# Patient Record
Sex: Female | Born: 1945 | Race: White | Hispanic: No | Smoking: Former smoker
Health system: Southern US, Community
[De-identification: ages and names within clinical notes are randomized; demographics above are authoritative.]

## PROBLEM LIST (undated history)

## (undated) DIAGNOSIS — H9190 Unspecified hearing loss, unspecified ear: Secondary | ICD-10-CM

## (undated) DIAGNOSIS — C3490 Malignant neoplasm of unspecified part of unspecified bronchus or lung: Secondary | ICD-10-CM

## (undated) DIAGNOSIS — R0602 Shortness of breath: Secondary | ICD-10-CM

## (undated) DIAGNOSIS — I10 Essential (primary) hypertension: Secondary | ICD-10-CM

## (undated) DIAGNOSIS — I739 Peripheral vascular disease, unspecified: Secondary | ICD-10-CM

## (undated) DIAGNOSIS — M25552 Pain in left hip: Secondary | ICD-10-CM

## (undated) DIAGNOSIS — M25512 Pain in left shoulder: Secondary | ICD-10-CM

## (undated) DIAGNOSIS — G2581 Restless legs syndrome: Secondary | ICD-10-CM

## (undated) DIAGNOSIS — F32A Depression, unspecified: Secondary | ICD-10-CM

## (undated) DIAGNOSIS — I6529 Occlusion and stenosis of unspecified carotid artery: Secondary | ICD-10-CM

## (undated) DIAGNOSIS — F329 Major depressive disorder, single episode, unspecified: Secondary | ICD-10-CM

## (undated) DIAGNOSIS — M545 Low back pain, unspecified: Secondary | ICD-10-CM

## (undated) DIAGNOSIS — C50919 Malignant neoplasm of unspecified site of unspecified female breast: Secondary | ICD-10-CM

## (undated) DIAGNOSIS — C801 Malignant (primary) neoplasm, unspecified: Secondary | ICD-10-CM

## (undated) DIAGNOSIS — E039 Hypothyroidism, unspecified: Secondary | ICD-10-CM

## (undated) DIAGNOSIS — R911 Solitary pulmonary nodule: Secondary | ICD-10-CM

## (undated) DIAGNOSIS — Z923 Personal history of irradiation: Secondary | ICD-10-CM

## (undated) HISTORY — PX: BREAST SURGERY: SHX581

## (undated) HISTORY — PX: TUBAL LIGATION: SHX77

## (undated) HISTORY — PX: VASCULAR SURGERY: SHX849

## (undated) HISTORY — PX: TONSILLECTOMY: SUR1361

## (undated) HISTORY — PX: MASTECTOMY: SHX3

## (undated) HISTORY — DX: Solitary pulmonary nodule: R91.1

---

## 1971-07-02 DIAGNOSIS — C50919 Malignant neoplasm of unspecified site of unspecified female breast: Secondary | ICD-10-CM

## 1971-07-02 HISTORY — DX: Malignant neoplasm of unspecified site of unspecified female breast: C50.919

## 1994-07-01 HISTORY — PX: PORTACATH PLACEMENT: SHX2246

## 1999-06-08 ENCOUNTER — Encounter: Admission: RE | Admit: 1999-06-08 | Discharge: 1999-06-08 | Payer: Self-pay | Admitting: Obstetrics and Gynecology

## 1999-06-08 ENCOUNTER — Encounter: Payer: Self-pay | Admitting: Obstetrics and Gynecology

## 1999-06-26 ENCOUNTER — Encounter (INDEPENDENT_AMBULATORY_CARE_PROVIDER_SITE_OTHER): Payer: Self-pay

## 1999-06-26 ENCOUNTER — Ambulatory Visit (HOSPITAL_COMMUNITY): Admission: RE | Admit: 1999-06-26 | Discharge: 1999-06-26 | Payer: Self-pay | Admitting: Obstetrics and Gynecology

## 2000-02-25 ENCOUNTER — Encounter: Admission: RE | Admit: 2000-02-25 | Discharge: 2000-02-25 | Payer: Self-pay | Admitting: Hematology & Oncology

## 2000-02-25 ENCOUNTER — Encounter: Payer: Self-pay | Admitting: Hematology & Oncology

## 2000-02-27 ENCOUNTER — Encounter: Admission: RE | Admit: 2000-02-27 | Discharge: 2000-02-27 | Payer: Self-pay | Admitting: Hematology & Oncology

## 2000-02-27 ENCOUNTER — Encounter: Payer: Self-pay | Admitting: Hematology & Oncology

## 2000-11-28 ENCOUNTER — Encounter: Admission: RE | Admit: 2000-11-28 | Discharge: 2000-11-28 | Payer: Self-pay | Admitting: Hematology & Oncology

## 2000-11-28 ENCOUNTER — Encounter: Payer: Self-pay | Admitting: Hematology & Oncology

## 2001-05-12 ENCOUNTER — Encounter: Payer: Self-pay | Admitting: Obstetrics and Gynecology

## 2001-05-12 ENCOUNTER — Encounter: Admission: RE | Admit: 2001-05-12 | Discharge: 2001-05-12 | Payer: Self-pay | Admitting: Obstetrics and Gynecology

## 2002-03-24 ENCOUNTER — Other Ambulatory Visit: Admission: RE | Admit: 2002-03-24 | Discharge: 2002-03-24 | Payer: Self-pay | Admitting: Gynecology

## 2003-06-02 ENCOUNTER — Other Ambulatory Visit: Admission: RE | Admit: 2003-06-02 | Discharge: 2003-06-02 | Payer: Self-pay | Admitting: Gynecology

## 2003-06-02 ENCOUNTER — Emergency Department (HOSPITAL_COMMUNITY): Admission: EM | Admit: 2003-06-02 | Discharge: 2003-06-02 | Payer: Self-pay

## 2003-06-02 ENCOUNTER — Encounter: Admission: RE | Admit: 2003-06-02 | Discharge: 2003-06-02 | Payer: Self-pay | Admitting: Hematology & Oncology

## 2004-01-03 ENCOUNTER — Ambulatory Visit (HOSPITAL_COMMUNITY): Admission: RE | Admit: 2004-01-03 | Discharge: 2004-01-04 | Payer: Self-pay | Admitting: Vascular Surgery

## 2004-01-07 HISTORY — PX: OTHER SURGICAL HISTORY: SHX169

## 2004-06-20 ENCOUNTER — Ambulatory Visit: Payer: Self-pay | Admitting: Hematology & Oncology

## 2004-12-27 ENCOUNTER — Ambulatory Visit: Payer: Self-pay | Admitting: Hematology & Oncology

## 2005-06-21 ENCOUNTER — Ambulatory Visit: Payer: Self-pay | Admitting: Hematology & Oncology

## 2006-04-30 ENCOUNTER — Ambulatory Visit: Payer: Self-pay | Admitting: Hematology & Oncology

## 2006-05-02 LAB — CBC WITH DIFFERENTIAL/PLATELET
BASO%: 0.3 % (ref 0.0–2.0)
Basophils Absolute: 0 10*3/uL (ref 0.0–0.1)
EOS%: 0.9 % (ref 0.0–7.0)
Eosinophils Absolute: 0.1 10*3/uL (ref 0.0–0.5)
HCT: 41.1 % (ref 34.8–46.6)
HGB: 14 g/dL (ref 11.6–15.9)
LYMPH%: 32 % (ref 14.0–48.0)
MCH: 33.4 pg (ref 26.0–34.0)
MCHC: 34.1 g/dL (ref 32.0–36.0)
MCV: 98.2 fL (ref 81.0–101.0)
MONO#: 1.1 10*3/uL — ABNORMAL HIGH (ref 0.1–0.9)
MONO%: 9.9 % (ref 0.0–13.0)
NEUT#: 6.4 10*3/uL (ref 1.5–6.5)
NEUT%: 56.9 % (ref 39.6–76.8)
Platelets: 291 10*3/uL (ref 145–400)
RBC: 4.19 10*6/uL (ref 3.70–5.32)
RDW: 14.2 % (ref 11.3–14.5)
WBC: 11.2 10*3/uL — ABNORMAL HIGH (ref 3.9–10.0)
lymph#: 3.6 10*3/uL — ABNORMAL HIGH (ref 0.9–3.3)

## 2006-05-02 LAB — COMPREHENSIVE METABOLIC PANEL
ALT: 12 U/L (ref 0–35)
AST: 16 U/L (ref 0–37)
Albumin: 4.1 g/dL (ref 3.5–5.2)
Alkaline Phosphatase: 54 U/L (ref 39–117)
BUN: 13 mg/dL (ref 6–23)
CO2: 26 mEq/L (ref 19–32)
Calcium: 9.2 mg/dL (ref 8.4–10.5)
Chloride: 103 mEq/L (ref 96–112)
Creatinine, Ser: 1.01 mg/dL (ref 0.40–1.20)
Glucose, Bld: 100 mg/dL — ABNORMAL HIGH (ref 70–99)
Potassium: 3.9 mEq/L (ref 3.5–5.3)
Sodium: 137 mEq/L (ref 135–145)
Total Bilirubin: 0.3 mg/dL (ref 0.3–1.2)
Total Protein: 7.2 g/dL (ref 6.0–8.3)

## 2006-05-05 LAB — HM DEXA SCAN

## 2006-05-16 ENCOUNTER — Other Ambulatory Visit: Admission: RE | Admit: 2006-05-16 | Discharge: 2006-05-16 | Payer: Self-pay | Admitting: Family Medicine

## 2006-05-29 LAB — HM PAP SMEAR

## 2007-05-06 ENCOUNTER — Ambulatory Visit: Payer: Self-pay | Admitting: Hematology & Oncology

## 2007-06-22 ENCOUNTER — Ambulatory Visit: Payer: Self-pay | Admitting: Hematology & Oncology

## 2007-06-26 LAB — COMPREHENSIVE METABOLIC PANEL
ALT: 13 U/L (ref 0–35)
AST: 17 U/L (ref 0–37)
Albumin: 4 g/dL (ref 3.5–5.2)
Alkaline Phosphatase: 58 U/L (ref 39–117)
BUN: 16 mg/dL (ref 6–23)
CO2: 26 mEq/L (ref 19–32)
Calcium: 9.3 mg/dL (ref 8.4–10.5)
Chloride: 106 mEq/L (ref 96–112)
Creatinine, Ser: 0.98 mg/dL (ref 0.40–1.20)
Glucose, Bld: 93 mg/dL (ref 70–99)
Potassium: 3.9 mEq/L (ref 3.5–5.3)
Sodium: 142 mEq/L (ref 135–145)
Total Bilirubin: 0.4 mg/dL (ref 0.3–1.2)
Total Protein: 7.1 g/dL (ref 6.0–8.3)

## 2007-06-26 LAB — CBC WITH DIFFERENTIAL/PLATELET
BASO%: 0.6 % (ref 0.0–2.0)
Basophils Absolute: 0.1 10*3/uL (ref 0.0–0.1)
EOS%: 1.7 % (ref 0.0–7.0)
Eosinophils Absolute: 0.2 10*3/uL (ref 0.0–0.5)
HCT: 41.6 % (ref 34.8–46.6)
HGB: 14.3 g/dL (ref 11.6–15.9)
LYMPH%: 30.6 % (ref 14.0–48.0)
MCH: 33 pg (ref 26.0–34.0)
MCHC: 34.3 g/dL (ref 32.0–36.0)
MCV: 96.2 fL (ref 81.0–101.0)
MONO#: 1.3 10*3/uL — ABNORMAL HIGH (ref 0.1–0.9)
MONO%: 11.9 % (ref 0.0–13.0)
NEUT#: 6 10*3/uL (ref 1.5–6.5)
NEUT%: 55.2 % (ref 39.6–76.8)
Platelets: 260 10*3/uL (ref 145–400)
RBC: 4.32 10*6/uL (ref 3.70–5.32)
RDW: 13.5 % (ref 11.3–14.5)
WBC: 10.8 10*3/uL — ABNORMAL HIGH (ref 3.9–10.0)
lymph#: 3.3 10*3/uL (ref 0.9–3.3)

## 2007-09-22 ENCOUNTER — Ambulatory Visit: Payer: Self-pay | Admitting: Hematology & Oncology

## 2008-03-16 ENCOUNTER — Ambulatory Visit: Payer: Self-pay | Admitting: Hematology & Oncology

## 2008-03-17 LAB — CBC WITH DIFFERENTIAL (CANCER CENTER ONLY)
BASO#: 0.1 10*3/uL (ref 0.0–0.2)
BASO%: 1 % (ref 0.0–2.0)
EOS%: 1.9 % (ref 0.0–7.0)
Eosinophils Absolute: 0.2 10*3/uL (ref 0.0–0.5)
HCT: 41.3 % (ref 34.8–46.6)
HGB: 14.2 g/dL (ref 11.6–15.9)
LYMPH#: 2.7 10*3/uL (ref 0.9–3.3)
LYMPH%: 28.5 % (ref 14.0–48.0)
MCH: 32.2 pg (ref 26.0–34.0)
MCHC: 34.3 g/dL (ref 32.0–36.0)
MCV: 94 fL (ref 81–101)
MONO#: 1.1 10*3/uL — ABNORMAL HIGH (ref 0.1–0.9)
MONO%: 12.2 % (ref 0.0–13.0)
NEUT#: 5.2 10*3/uL (ref 1.5–6.5)
NEUT%: 56.4 % (ref 39.6–80.0)
Platelets: 259 10*3/uL (ref 145–400)
RBC: 4.4 10*6/uL (ref 3.70–5.32)
RDW: 10.9 % (ref 10.5–14.6)
WBC: 9.3 10*3/uL (ref 3.9–10.0)

## 2008-03-17 LAB — COMPREHENSIVE METABOLIC PANEL
ALT: 11 U/L (ref 0–35)
AST: 15 U/L (ref 0–37)
Albumin: 3.9 g/dL (ref 3.5–5.2)
Alkaline Phosphatase: 70 U/L (ref 39–117)
BUN: 14 mg/dL (ref 6–23)
CO2: 26 mEq/L (ref 19–32)
Calcium: 9.8 mg/dL (ref 8.4–10.5)
Chloride: 107 mEq/L (ref 96–112)
Creatinine, Ser: 0.86 mg/dL (ref 0.40–1.20)
Glucose, Bld: 104 mg/dL — ABNORMAL HIGH (ref 70–99)
Potassium: 3.8 mEq/L (ref 3.5–5.3)
Sodium: 143 mEq/L (ref 135–145)
Total Bilirubin: 0.5 mg/dL (ref 0.3–1.2)
Total Protein: 7.1 g/dL (ref 6.0–8.3)

## 2008-09-13 ENCOUNTER — Ambulatory Visit: Payer: Self-pay | Admitting: Hematology & Oncology

## 2008-09-14 LAB — CBC WITH DIFFERENTIAL (CANCER CENTER ONLY)
BASO#: 0.1 10*3/uL (ref 0.0–0.2)
BASO%: 0.7 % (ref 0.0–2.0)
EOS%: 2 % (ref 0.0–7.0)
Eosinophils Absolute: 0.2 10*3/uL (ref 0.0–0.5)
HCT: 41.2 % (ref 34.8–46.6)
HGB: 13.7 g/dL (ref 11.6–15.9)
LYMPH#: 3 10*3/uL (ref 0.9–3.3)
LYMPH%: 29.2 % (ref 14.0–48.0)
MCH: 32.3 pg (ref 26.0–34.0)
MCHC: 33.2 g/dL (ref 32.0–36.0)
MCV: 97 fL (ref 81–101)
MONO#: 0.7 10*3/uL (ref 0.1–0.9)
MONO%: 7.1 % (ref 0.0–13.0)
NEUT#: 6.2 10*3/uL (ref 1.5–6.5)
NEUT%: 61 % (ref 39.6–80.0)
Platelets: 246 10*3/uL (ref 145–400)
RBC: 4.23 10*6/uL (ref 3.70–5.32)
RDW: 10.9 % (ref 10.5–14.6)
WBC: 10.1 10*3/uL — ABNORMAL HIGH (ref 3.9–10.0)

## 2008-09-14 LAB — COMPREHENSIVE METABOLIC PANEL
ALT: 11 U/L (ref 0–35)
AST: 14 U/L (ref 0–37)
Albumin: 3.9 g/dL (ref 3.5–5.2)
Alkaline Phosphatase: 70 U/L (ref 39–117)
BUN: 12 mg/dL (ref 6–23)
CO2: 26 mEq/L (ref 19–32)
Calcium: 9.9 mg/dL (ref 8.4–10.5)
Chloride: 104 mEq/L (ref 96–112)
Creatinine, Ser: 1.01 mg/dL (ref 0.40–1.20)
Glucose, Bld: 113 mg/dL — ABNORMAL HIGH (ref 70–99)
Potassium: 4 mEq/L (ref 3.5–5.3)
Sodium: 139 mEq/L (ref 135–145)
Total Bilirubin: 0.4 mg/dL (ref 0.3–1.2)
Total Protein: 7.3 g/dL (ref 6.0–8.3)

## 2009-04-04 ENCOUNTER — Ambulatory Visit: Payer: Self-pay | Admitting: Hematology & Oncology

## 2009-04-06 LAB — CBC WITH DIFFERENTIAL (CANCER CENTER ONLY)
BASO#: 0.1 10*3/uL (ref 0.0–0.2)
BASO%: 1.3 % (ref 0.0–2.0)
EOS%: 1.8 % (ref 0.0–7.0)
Eosinophils Absolute: 0.2 10*3/uL (ref 0.0–0.5)
HCT: 43.5 % (ref 34.8–46.6)
HGB: 15 g/dL (ref 11.6–15.9)
LYMPH#: 2.8 10*3/uL (ref 0.9–3.3)
LYMPH%: 26 % (ref 14.0–48.0)
MCH: 32.8 pg (ref 26.0–34.0)
MCHC: 34.4 g/dL (ref 32.0–36.0)
MCV: 95 fL (ref 81–101)
MONO#: 1 10*3/uL — ABNORMAL HIGH (ref 0.1–0.9)
MONO%: 9.6 % (ref 0.0–13.0)
NEUT#: 6.5 10*3/uL (ref 1.5–6.5)
NEUT%: 61.3 % (ref 39.6–80.0)
Platelets: 276 10*3/uL (ref 145–400)
RBC: 4.56 10*6/uL (ref 3.70–5.32)
RDW: 11.4 % (ref 10.5–14.6)
WBC: 10.6 10*3/uL — ABNORMAL HIGH (ref 3.9–10.0)

## 2009-04-07 LAB — COMPREHENSIVE METABOLIC PANEL
ALT: 11 U/L (ref 0–35)
AST: 14 U/L (ref 0–37)
Albumin: 4.1 g/dL (ref 3.5–5.2)
Alkaline Phosphatase: 68 U/L (ref 39–117)
BUN: 13 mg/dL (ref 6–23)
CO2: 28 mEq/L (ref 19–32)
Calcium: 9.6 mg/dL (ref 8.4–10.5)
Chloride: 103 mEq/L (ref 96–112)
Creatinine, Ser: 1.01 mg/dL (ref 0.40–1.20)
Glucose, Bld: 93 mg/dL (ref 70–99)
Potassium: 4 mEq/L (ref 3.5–5.3)
Sodium: 140 mEq/L (ref 135–145)
Total Bilirubin: 0.5 mg/dL (ref 0.3–1.2)
Total Protein: 7.3 g/dL (ref 6.0–8.3)

## 2009-04-07 LAB — VITAMIN D 25 HYDROXY (VIT D DEFICIENCY, FRACTURES): Vit D, 25-Hydroxy: 31 ng/mL (ref 30–89)

## 2009-04-14 LAB — FECAL OCCULT BLOOD, GUIAC - CHCC SATELLITE: Occult Blood: NEGATIVE

## 2009-10-03 ENCOUNTER — Ambulatory Visit: Payer: Self-pay | Admitting: Hematology & Oncology

## 2009-10-05 LAB — CBC WITH DIFFERENTIAL (CANCER CENTER ONLY)
BASO#: 0.1 10*3/uL (ref 0.0–0.2)
BASO%: 1.1 % (ref 0.0–2.0)
EOS%: 2 % (ref 0.0–7.0)
Eosinophils Absolute: 0.2 10*3/uL (ref 0.0–0.5)
HCT: 43.7 % (ref 34.8–46.6)
HGB: 14.7 g/dL (ref 11.6–15.9)
LYMPH#: 2.9 10*3/uL (ref 0.9–3.3)
LYMPH%: 28.5 % (ref 14.0–48.0)
MCH: 31.7 pg (ref 26.0–34.0)
MCHC: 33.6 g/dL (ref 32.0–36.0)
MCV: 94 fL (ref 81–101)
MONO#: 1 10*3/uL — ABNORMAL HIGH (ref 0.1–0.9)
MONO%: 9.6 % (ref 0.0–13.0)
NEUT#: 5.9 10*3/uL (ref 1.5–6.5)
NEUT%: 58.8 % (ref 39.6–80.0)
Platelets: 261 10*3/uL (ref 145–400)
RBC: 4.64 10*6/uL (ref 3.70–5.32)
RDW: 11.1 % (ref 10.5–14.6)
WBC: 10.1 10*3/uL — ABNORMAL HIGH (ref 3.9–10.0)

## 2009-10-05 LAB — COMPREHENSIVE METABOLIC PANEL
ALT: 10 U/L (ref 0–35)
AST: 15 U/L (ref 0–37)
Albumin: 4.1 g/dL (ref 3.5–5.2)
Alkaline Phosphatase: 68 U/L (ref 39–117)
BUN: 10 mg/dL (ref 6–23)
CO2: 26 mEq/L (ref 19–32)
Calcium: 9.4 mg/dL (ref 8.4–10.5)
Chloride: 104 mEq/L (ref 96–112)
Creatinine, Ser: 1.01 mg/dL (ref 0.40–1.20)
Glucose, Bld: 99 mg/dL (ref 70–99)
Potassium: 3.8 mEq/L (ref 3.5–5.3)
Sodium: 138 mEq/L (ref 135–145)
Total Bilirubin: 0.3 mg/dL (ref 0.3–1.2)
Total Protein: 6.6 g/dL (ref 6.0–8.3)

## 2009-10-05 LAB — VITAMIN D 25 HYDROXY (VIT D DEFICIENCY, FRACTURES): Vit D, 25-Hydroxy: 29 ng/mL — ABNORMAL LOW (ref 30–89)

## 2010-09-15 DIAGNOSIS — C50919 Malignant neoplasm of unspecified site of unspecified female breast: Secondary | ICD-10-CM | POA: Insufficient documentation

## 2010-09-15 DIAGNOSIS — I639 Cerebral infarction, unspecified: Secondary | ICD-10-CM | POA: Insufficient documentation

## 2010-10-04 ENCOUNTER — Other Ambulatory Visit: Payer: Self-pay | Admitting: Hematology & Oncology

## 2010-10-04 ENCOUNTER — Encounter (HOSPITAL_BASED_OUTPATIENT_CLINIC_OR_DEPARTMENT_OTHER): Payer: Medicare Other | Admitting: Hematology & Oncology

## 2010-10-04 DIAGNOSIS — Z853 Personal history of malignant neoplasm of breast: Secondary | ICD-10-CM

## 2010-10-04 DIAGNOSIS — E559 Vitamin D deficiency, unspecified: Secondary | ICD-10-CM

## 2010-10-04 DIAGNOSIS — C50419 Malignant neoplasm of upper-outer quadrant of unspecified female breast: Secondary | ICD-10-CM

## 2010-10-04 DIAGNOSIS — F17200 Nicotine dependence, unspecified, uncomplicated: Secondary | ICD-10-CM

## 2010-10-04 DIAGNOSIS — Z170 Estrogen receptor positive status [ER+]: Secondary | ICD-10-CM

## 2010-10-04 LAB — CBC WITH DIFFERENTIAL (CANCER CENTER ONLY)
BASO#: 0 10*3/uL (ref 0.0–0.2)
BASO%: 0.4 % (ref 0.0–2.0)
EOS%: 1.3 % (ref 0.0–7.0)
Eosinophils Absolute: 0.1 10*3/uL (ref 0.0–0.5)
HCT: 41.1 % (ref 34.8–46.6)
HGB: 13.7 g/dL (ref 11.6–15.9)
LYMPH#: 2.3 10*3/uL (ref 0.9–3.3)
LYMPH%: 21.6 % (ref 14.0–48.0)
MCH: 32 pg (ref 26.0–34.0)
MCHC: 33.3 g/dL (ref 32.0–36.0)
MCV: 96 fL (ref 81–101)
MONO#: 1.3 10*3/uL — ABNORMAL HIGH (ref 0.1–0.9)
MONO%: 12.5 % (ref 0.0–13.0)
NEUT#: 6.8 10*3/uL — ABNORMAL HIGH (ref 1.5–6.5)
NEUT%: 64.2 % (ref 39.6–80.0)
Platelets: 281 10*3/uL (ref 145–400)
RBC: 4.28 10*6/uL (ref 3.70–5.32)
RDW: 13.2 % (ref 11.1–15.7)
WBC: 10.5 10*3/uL — ABNORMAL HIGH (ref 3.9–10.0)

## 2010-10-05 LAB — COMPREHENSIVE METABOLIC PANEL
ALT: 11 U/L (ref 0–35)
AST: 14 U/L (ref 0–37)
Albumin: 3.9 g/dL (ref 3.5–5.2)
Alkaline Phosphatase: 67 U/L (ref 39–117)
BUN: 10 mg/dL (ref 6–23)
CO2: 30 mEq/L (ref 19–32)
Calcium: 10.3 mg/dL (ref 8.4–10.5)
Chloride: 103 mEq/L (ref 96–112)
Creatinine, Ser: 0.92 mg/dL (ref 0.40–1.20)
Glucose, Bld: 72 mg/dL (ref 70–99)
Potassium: 3.7 mEq/L (ref 3.5–5.3)
Sodium: 141 mEq/L (ref 135–145)
Total Bilirubin: 0.2 mg/dL — ABNORMAL LOW (ref 0.3–1.2)
Total Protein: 7.2 g/dL (ref 6.0–8.3)

## 2010-10-05 LAB — VITAMIN D 25 HYDROXY (VIT D DEFICIENCY, FRACTURES): Vit D, 25-Hydroxy: 58 ng/mL (ref 30–89)

## 2010-11-16 NOTE — Op Note (Signed)
Katelyn [name], [name] NO.:  [id number]   MEDICAL RECORD NO.:  [id number]                   PATIENT TYPE:  OIB   LOCATION:  2899                                 FACILITY:  [facility]   PHYSICIAN:  Janetta Hora. Fields, MD               DATE OF BIRTH:  11/27/1945   DATE OF PROCEDURE:  01/03/2004  DATE OF DISCHARGE:                                 OPERATIVE REPORT   PROCEDURES:  1. Arch aortogram.  2. Left subclavian arteriogram.  3. Left upper extremity arteriogram.  4. Stenting of left subclavian artery.   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   SURGEON:  Janetta Hora. Fields, M.D.   ASSISTANT:  Nurse.   ANESTHESIA:  Local.   INDICATIONS:  Patient has evidence of left subclavian stenosis by Duplex  ultrasound.  She recently had an embolic showering event which affected all  digits in her left hand.  She presents for possible treatment of left  subclavian stenosis.   OPERATIVE DETAILS:  After obtaining informed consent, the patient was  brought to the [facility] lab.  The patient was placed on the supine position on the  angio table.  Next, the patient's right groin was prepped and draped in the  usual sterile fashion.  Local anesthesia was infiltrated over the right  common femoral artery.  Next, a Majestic needle was used to cannulate the  right common femoral artery.  A 0.035 Wholey wire was then advanced into the  abdominal aorta under fluoroscopic guidance.  A 5-French sheath was then  placed over the guidewire into the right common femoral artery.  Next, a 5-  French pigtail catheter was threaded over the guidewire and these were  advanced as a unit up into the ascending aorta; the guidewire was then  removed and an ascending aortogram was then obtained.  This shows the  innominate artery comes off  normal position and its origin is without  stenosis.  The origins of the right subclavian and right common carotid  artery are normal in appearance.  The origins of the right vertebral and  right internal mammary artery are normal in appearance.  Next, additionally,  the left common carotid artery comes off normal position and is normal in  appearance.  The left vertebral artery has its origin off of the aorta; it  is normal in appearance at its origin.  The left subclavian artery comes off  just distal to this and its origin is widely patent.  Next, the pigtail  catheter was removed over the guidewire and a JB2 catheter was used to  selectively cannulate the left subclavian artery.  A 0.035 Glidewire was  used in assistance with this.  Next, a selective angiogram of the left  subclavian was obtained and this shows a  tight area of stenosis just below  the apex of the subclavian artery.  There is also an area of ulcerated  plaque out distally in the proximal axillary artery.  Next, the Glidewire  was advanced down into the distal axillary artery and views of the axillary,  brachial and radial system were obtained.  The left brachial artery is  widely patent.  The left radial artery is occluded near its origin and the  takeoff of this is not visualized.  The interosseous and ulnar arteries are  widely patent down into the hand.  The patient was given 200 mcg of  nitroglycerin through the catheter in the artery prior to the injection for  the hand; this shows rapid filling of the ulnar artery and the superficial  palmar arch.  The digital arteries are not well-visualized throughout the  hand.  There is rapid capillary refill of the musculature throughout the  hand.  There is some filling of the palmar arch from the interosseous by  collaterals as well.  Next, a magnified view of the ulcerated plaque of the  proximal axillary area was obtained.  This area of ulceration is just above  the area  of previously placed clips from left mastectomy.  The area of  ulceration is approximately 4.5 cm in length and occurs in the area just  before the glenohumeral junction.   Next, the catheter was removed over the guidewire and the Glidewire  exchanged through the catheter for the 0.035 Wholey wire and this was locked  with an extension.  This was then advanced down into the distal axillary  artery and the 5-French sheath was then exchanged for a 90-cm 6-French long  sheath and this was advanced up to and past the level of stenosis in the  subclavian artery.  The patient was given 5000 units of intravenous heparin.  Next, a GENESIS 7 x 24 stent was brought up, placed through the sheath over  the guidewire up to the level of stenosis.  The stent was then deployed to 6  atmospheres for 30 seconds.  A completion angiogram was then obtained which  showed that the left subclavian artery is widely patent in this area now.  It was decided that the area of ulceration was out too far distal to be  treatable percutaneously.  Therefore, at this point, the balloon was removed  over the guidewire and then the guidewire removed.  The sheath was then  pulled down over the guidewire into the right iliac system.  The long 90-cm  sheath was then exchanged for a short 6-French sheath in the groin until the  ACT was in reasonable range for removal of the sheath.  Patient tolerated  the procedure well and there were no complications.  The patient was taken  to the recovery room in stable condition.   OPERATIVE FINDINGS:  1. Left subclavian stenosis treated with 7 x 24 GENESIS stent.  2. Ulcerated plaque of proximal axillary artery.  3. Occluded left radial artery.                                               Janetta Hora. Fields, MD    CEF/MEDQ  D:  01/03/2004  T:  01/03/2004  Job:  [id number]

## 2011-08-21 ENCOUNTER — Telehealth: Payer: Self-pay | Admitting: Hematology & Oncology

## 2011-08-21 NOTE — Telephone Encounter (Signed)
Pt aware of 2-25 appointment, daughter Alexia Freestone called and said patient needs to be seen. She is bring scans from [facility].

## 2011-08-26 ENCOUNTER — Ambulatory Visit (HOSPITAL_BASED_OUTPATIENT_CLINIC_OR_DEPARTMENT_OTHER): Payer: Medicare Other | Admitting: Hematology & Oncology

## 2011-08-26 ENCOUNTER — Encounter: Payer: Self-pay | Admitting: Hematology & Oncology

## 2011-08-26 DIAGNOSIS — Z853 Personal history of malignant neoplasm of breast: Secondary | ICD-10-CM

## 2011-08-26 DIAGNOSIS — D447 Neoplasm of uncertain behavior of aortic body and other paraganglia: Secondary | ICD-10-CM | POA: Insufficient documentation

## 2011-08-26 DIAGNOSIS — R911 Solitary pulmonary nodule: Secondary | ICD-10-CM

## 2011-08-26 HISTORY — DX: Solitary pulmonary nodule: R91.1

## 2011-08-26 NOTE — Progress Notes (Signed)
This office note has been dictated.

## 2011-08-27 NOTE — Progress Notes (Signed)
CC:   Katelyn [name], M.D.  DIAGNOSES: 1. Left upper lobe nodule-suspicious for malignancy. 2. 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  HISTORY OF PRESENT ILLNESS:  Katelyn [name] is a very nice 66 year old white female who actually I have known for 16 years.  She had a history of inflammatory carcinoma of the left breast.  She underwent aggressive adjuvant chemo and radiation therapy.  She is cured from this.  She, unfortunately, has been out at [facility].  She was found to have a vascular tumor in the right ear.  This was felt to be a glomus tumor. She was referred to Radiation Oncology for gamma knife therapy.  She saw Dr. Austin Miles.  As part of the workup, she had a CT scan.  The CT scan did show a nodule in the left upper lung.  She did undergo a PET scan out at [facility].  This was done on 08/09/2011.  PET scan did show activity in the left upper lobe.  There are 2 areas of spiculated density.  These were both in the left upper lobe.  I was called by 1 of the surgeons out at [facility].  We are now following up with Katelyn [name].  The PET scan that she had done did not show any areas of abnormality elsewhere.  She has a history of heavy smoking.  She stopped in January.  She says that she has gained some weight.  She has had some left shoulder issues.  These have as been chronic.  She has had no fever.  There has been no cough.  There has been no nausea or vomiting.  There has been no change in bowel or bladder habits.  She has had no leg swelling.  Again, she had a fairly extensive workup at [facility].  She had a CT of the brain and neck which looked okay.  These were angiogram therapy studies.  PAST MEDICAL HISTORY:  Remarkable for: 1. Inflammatory carcinoma of the left breast. 2. Hypothyroidism. 3. Hyperlipidemia. 4. Hypertension. 5. Restless legs syndrome.  ALLERGIES:  None.  MEDICATIONS:   Aspirin, 325 mg p.o. daily, Lipitor 40 mg p.o. daily, Celexa 40 mg p.o. daily, Synthroid 0.075 mg daily, Zestoretic (10/12.5) 1 p.o. daily, and Mirapex 0.25 mg p.o. t.i.d.  SOCIAL HISTORY:  Remarkable for past tobacco use.  She probably has about a 50-60 pack-year history of tobacco use.  Again, she stopped in January.  There is no alcohol use.  There is no obvious occupational exposures.  FAMILY HISTORY:  Relatively noncontributory.  PHYSICAL EXAM:  General: This is a well-developed, well-nourished, white female in no obvious distress.  Vital signs: 97, pulse 81, respiratory 20, blood pressure 157/76, and weight is 170.  Head and neck exam shows a normocephalic, atraumatic skull.  There are no ocular or oral lesions. No palpable cervical or supraclavicular lymph nodes.  Lungs are clear to percussion and auscultation bilaterally.  Cardiac examination:  Regular rate and rhythm with a normal S1 and S2.  There are no murmurs, rubs, or bruits.  Abdominal exam: Soft with good bowel sounds.  There is no palpable abdominal mass.  There is no palpable hepatosplenomegaly. Chest wall exam: Shows bilateral mastectomies.  No chest wall nodules, erythema, or masses are noted.  There is bilateral axillary adenopathy. Extremities: Shows no clubbing, cyanosis, or edema.  She has good range of motion of her joints.  Skin exam: No rashes,  ecchymoses, or petechia. Neurological exam: Shows no focal neurological deficits.  LABORATORY STUDIES:  Were not done this visit.  IMPRESSION:  Katelyn [name] is a 66 year old white female with a past history of inflammatory carcinoma of the left breast.  She is now 16 years out from treatment.  She has this suspicious nodule in the left upper lobe.  There is some PET activity.  I am really concerned regarding this as being malignant.  My concern is raised mostly because of the fact that she had received radiation therapy to the left chest wall.  She now is in the  right time frame for a secondary cancer to arise from radiation.  It looks like this tumor is going to be very difficult to biopsy.  It is right up against the lateral aspect of the lung. It is up by a rib and protected, I think, by the shoulder blade.  I do not think that there is any way that this could be biopsied.  I really believe that we are looking at surgical resection in all honesty.  She just has the abnormal activity in the left upper lobe.  I suspect that she could tolerate a lobectomy if necessary.  I put a phone call into Dr. Dorris [name] in Thoracic Surgery.  Hopefully, he will be able to see her.  It is possible that biopsy might be done through EMN technique.  I would worry about this being a sarcoma given its location.  I suppose this could be a benign process.  However, I just cannot get over the fact that this is the same side that she had her radiation therapy.  I want to talk with Katelyn [name] and her daughters.  I told Katelyn [name] that we would move her through and try to get this taken care of.  I do not see how her treatment for this vascular tumor will interfere with her workup for the left lung nodule and vice versa.  She is due for the gamma knife for 03/12.  She certainly can  continue on with this if she feels ready.  I told Katelyn [name] that this is how the good Lord works in which she had 1 problem, but I feel a much more serious problem was identified.  I think that if she did not have a workup for this glomus tumor, this process in the left lung would never been found and if it is malignant, it probably would have been found at a stage that she would not be cured.  I spent a good hour with Katelyn [name] and her daughters.  As always, it is good to see Katelyn [name].  This is a totally separate issue that we are dealing with, and we need to be aggressive with this.    ______________________________ Josph Macho, M.D. PRE/MEDQ  D:  08/26/2011   T:  08/26/2011  Job:  [id number]

## 2011-09-03 ENCOUNTER — Encounter: Payer: Medicare Other | Admitting: Thoracic Surgery (Cardiothoracic Vascular Surgery)

## 2011-09-06 ENCOUNTER — Institutional Professional Consult (permissible substitution) (INDEPENDENT_AMBULATORY_CARE_PROVIDER_SITE_OTHER): Payer: Medicare Other | Admitting: Thoracic Surgery (Cardiothoracic Vascular Surgery)

## 2011-09-06 ENCOUNTER — Other Ambulatory Visit: Payer: Self-pay

## 2011-09-06 ENCOUNTER — Encounter: Payer: Self-pay | Admitting: Thoracic Surgery (Cardiothoracic Vascular Surgery)

## 2011-09-06 VITALS — BP 126/74 | HR 76 | Resp 16 | Ht 67.0 in | Wt 165.0 lb

## 2011-09-06 DIAGNOSIS — D381 Neoplasm of uncertain behavior of trachea, bronchus and lung: Secondary | ICD-10-CM

## 2011-09-06 DIAGNOSIS — D491 Neoplasm of unspecified behavior of respiratory system: Secondary | ICD-10-CM

## 2011-09-06 NOTE — Progress Notes (Signed)
PCP is Shireen Quan, MD, MD Referring Provider is Josph Macho, MD  Chief Complaint  Patient presents with  . 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 She essentially had a PET CT done at [facility]. We do not have a formal report from that, but there were 2 irregular nodular densities near the left apex, one anteriorly and one posteriorly, that did have hypermetabolic uptake. I do not have the SUV of these lesions, as the formal report is not available. She denies weight loss, in fact has gained weight since she stopped smoking in January. She denies cough, hemoptysis, fevers or chills, exposure to TB or fungal sources.  She is scheduled to have cyber knife radiation therapy to her glomus tumor on 09/10/2011   Past Medical History  Diagnosis Date  . 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 Pulmonary nodule, left 08/26/2011  Inflammatory breast cancer, rx with surgery, radiation and chemotherapy   Bilateral mastectomy  No family history on file.  Social History History  Substance Use Topics  . Smoking status: Former Smoker -- 1.0 packs/day for 40 years    Quit date: 07/09/2011  . Smokeless tobacco: Former Neurosurgeon    Quit date: 07/09/2011  . Alcohol Use: No    Current Outpatient Prescriptions  Medication Sig Dispense Refill  . aspirin 325 MG tablet Take 325 mg by mouth daily.        Marland Kitchen atorvastatin (LIPITOR) 40 MG tablet Take 40 mg by mouth Daily.      . Cholecalciferol (VITAMIN D) 2000 UNITS CAPS Take 2,000 capsules by mouth daily.        . citalopram (CELEXA) 40 MG tablet Take 40 mg by mouth daily.        Marland Kitchen levothyroxine (SYNTHROID, LEVOTHROID) 75 MCG tablet  Take 75 mcg by mouth daily.        Marland Kitchen lisinopril-hydrochlorothiazide (PRINZIDE,ZESTORETIC) 10-12.5 MG per tablet Take 1 tablet by mouth daily.        . pramipexole (MIRAPEX) 0.125 MG tablet 0.125 mg Three times a day.        No Known Allergies  Review of Systems  BP 126/74  Pulse 76  Resp 16  Ht 5\' 7"  (1.702 m)  Wt 165 lb (74.844 kg)  BMI 25.84 kg/m2  SpO2 95% Physical Exam  Constitutional: She is oriented to person, place, and time. She appears well-developed and well-nourished. No distress.  HENT:  Head: Normocephalic and atraumatic.  Eyes: EOM are normal. Pupils are equal, round, and reactive to light.  Neck: Normal range of motion. Neck supple. No JVD present. No thyromegaly present.  Cardiovascular: Normal rate, regular rhythm and normal heart sounds.   No murmur heard. Pulmonary/Chest: Effort normal and breath sounds normal. No respiratory distress. She has no wheezes. She has no rales.       Left clavicle deformity Prior mastectomy with well healed scars  Abdominal: Soft. There is no tenderness.  Musculoskeletal: She exhibits no edema.  Lymphadenopathy:    She has no cervical adenopathy.  Neurological: She is alert and oriented to person, place, and time.       No focal motor deficits  Skin: Skin is warm and dry.     Diagnostic Tests: PET/CT from [facility] reviewed. The formal report not available. 2 irregular densities at the left apex, one anterior and one posterior, hypermetabolic by PET  Impression: Mrs. Katelyn [name] is a 66 year old woman with a history of tobacco abuse and also history of inflammatory left breast cancer treated with surgery, radiation and chemotherapy in 1996. She now has to incidentally found nodules in her left apex these are relatively close to the chest wall albeit on opposite sides of the lung. The differential diagnosis includes primary lung cancer, sarcoma due to previous radiation, infection (bacterial, fungal, TB) or inflammatory  disease. One issue in her case is that she would not be a candidate for radiation to the area due to the previous radiation for her inflammatory breast cancer.  I reviewed the PET scan with Mrs. Gardiner, her daughter and her daughter-in-law. We also discussed the differential diagnosis as well as diagnostic and therapeutic options. I did stress that one option would be to follow this with serial scans to see if there was any growth or other change. A second option would be to attempt a transthoracic needle aspiration. Unfortunately with a TTNA if they were positive she would need surgery she's not a candidate for radiation, and this too higher incidence of false negative to disregard the lesions if the biopsies were negative. They understand the only way to definitively is to do an excisional biopsy.  I discussed with them the option of a left VATS with wedge resection of the 2 nodules, followed by possible lobectomy if it were indicated based on intraoperative frozen section findings. They understand indications, risks, benefits, and alternatives, as well as the intraoperative decision making. They understand the need for general anesthesia, incisions were used, possibility of needing a thoracotomy due to scar tissue from previous radiation, expected hospital stay, and overall recovery. They understand the risks include but are not limited to death, MI, DVT, PE, infection, leading, possible need for transfusion, prolonged air leak, cardiac arrhythmias, as well as unforeseeable or unpredictable complications.  Plan: After discussion Mrs. Jon wishes to proceed with surgical biopsy, and if indicated definitive treatment. She would not feel comfortable with observation given her prior history. She is scheduled to have cyber knife treatment of her glomus tumor next week. We will tentatively plan to proceed with left VATS, wedge resection, possible lobectomy on Tuesday, March 19. She would be admitted the day of  surgery

## 2011-09-09 ENCOUNTER — Other Ambulatory Visit: Payer: Self-pay

## 2011-09-09 DIAGNOSIS — D381 Neoplasm of uncertain behavior of trachea, bronchus and lung: Secondary | ICD-10-CM

## 2011-09-10 ENCOUNTER — Encounter (HOSPITAL_COMMUNITY): Payer: Self-pay | Admitting: Pharmacy Technician

## 2011-09-13 ENCOUNTER — Other Ambulatory Visit: Payer: Self-pay

## 2011-09-13 ENCOUNTER — Encounter (HOSPITAL_COMMUNITY)
Admission: RE | Admit: 2011-09-13 | Discharge: 2011-09-13 | Disposition: A | Discharge status: Home / Self Care | Payer: Medicare Other | Source: Ambulatory Visit | Attending: Thoracic Surgery (Cardiothoracic Vascular Surgery) | Admitting: Thoracic Surgery (Cardiothoracic Vascular Surgery)

## 2011-09-13 ENCOUNTER — Encounter (HOSPITAL_COMMUNITY): Payer: Self-pay

## 2011-09-13 ENCOUNTER — Inpatient Hospital Stay (HOSPITAL_COMMUNITY)
Admission: RE | Admit: 2011-09-13 | Discharge: 2011-09-13 | Disposition: A | Discharge status: Home / Self Care | Payer: Medicare Other | Source: Ambulatory Visit | Attending: Thoracic Surgery (Cardiothoracic Vascular Surgery) | Admitting: Thoracic Surgery (Cardiothoracic Vascular Surgery)

## 2011-09-13 ENCOUNTER — Ambulatory Visit (HOSPITAL_COMMUNITY)
Admission: RE | Admit: 2011-09-13 | Discharge: 2011-09-13 | Disposition: A | Discharge status: Home / Self Care | Payer: Medicare Other | Source: Ambulatory Visit | Attending: Thoracic Surgery (Cardiothoracic Vascular Surgery) | Admitting: Thoracic Surgery (Cardiothoracic Vascular Surgery)

## 2011-09-13 VITALS — BP 134/74 | HR 64 | Temp 97.1°F | Resp 20 | Ht 67.0 in | Wt 175.5 lb

## 2011-09-13 DIAGNOSIS — D381 Neoplasm of uncertain behavior of trachea, bronchus and lung: Secondary | ICD-10-CM

## 2011-09-13 DIAGNOSIS — Z01810 Encounter for preprocedural cardiovascular examination: Secondary | ICD-10-CM | POA: Insufficient documentation

## 2011-09-13 DIAGNOSIS — Z01812 Encounter for preprocedural laboratory examination: Secondary | ICD-10-CM | POA: Insufficient documentation

## 2011-09-13 DIAGNOSIS — Z01818 Encounter for other preprocedural examination: Secondary | ICD-10-CM | POA: Insufficient documentation

## 2011-09-13 DIAGNOSIS — J984 Other disorders of lung: Secondary | ICD-10-CM | POA: Insufficient documentation

## 2011-09-13 HISTORY — DX: Essential (primary) hypertension: I10

## 2011-09-13 HISTORY — DX: Pain in left hip: M25.552

## 2011-09-13 HISTORY — DX: Hypothyroidism, unspecified: E03.9

## 2011-09-13 HISTORY — DX: Peripheral vascular disease, unspecified: I73.9

## 2011-09-13 HISTORY — DX: Unspecified hearing loss, unspecified ear: H91.90

## 2011-09-13 HISTORY — DX: Low back pain, unspecified: M54.50

## 2011-09-13 HISTORY — DX: Low back pain: M54.5

## 2011-09-13 HISTORY — DX: Malignant (primary) neoplasm, unspecified: C80.1

## 2011-09-13 HISTORY — DX: Depression, unspecified: F32.A

## 2011-09-13 HISTORY — DX: Pain in left shoulder: M25.512

## 2011-09-13 HISTORY — DX: Major depressive disorder, single episode, unspecified: F32.9

## 2011-09-13 HISTORY — DX: Occlusion and stenosis of unspecified carotid artery: I65.29

## 2011-09-13 HISTORY — DX: Shortness of breath: R06.02

## 2011-09-13 HISTORY — DX: Restless legs syndrome: G25.81

## 2011-09-13 LAB — PULMONARY FUNCTION TEST

## 2011-09-13 LAB — BLOOD GAS, ARTERIAL
Bicarbonate: 24.4 mEq/L — ABNORMAL HIGH (ref 20.0–24.0)
FIO2: 0.21 %
O2 Saturation: 96.4 %
Patient temperature: 98.6

## 2011-09-13 LAB — URINALYSIS, ROUTINE W REFLEX MICROSCOPIC
Bilirubin Urine: NEGATIVE
Ketones, ur: NEGATIVE mg/dL
Specific Gravity, Urine: 1.019 (ref 1.005–1.030)
Urobilinogen, UA: 0.2 mg/dL (ref 0.0–1.0)

## 2011-09-13 LAB — COMPREHENSIVE METABOLIC PANEL
ALT: 15 U/L (ref 0–35)
AST: 18 U/L (ref 0–37)
Albumin: 3.7 g/dL (ref 3.5–5.2)
Calcium: 10.4 mg/dL (ref 8.4–10.5)
Creatinine, Ser: 0.85 mg/dL (ref 0.50–1.10)
Sodium: 136 mEq/L (ref 135–145)
Total Protein: 7.3 g/dL (ref 6.0–8.3)

## 2011-09-13 LAB — PROTIME-INR: INR: 0.91 (ref 0.00–1.49)

## 2011-09-13 LAB — CBC
MCH: 32.1 pg (ref 26.0–34.0)
MCV: 96 fL (ref 78.0–100.0)
Platelets: 215 10*3/uL (ref 150–400)
RBC: 4.21 MIL/uL (ref 3.87–5.11)
RDW: 13.2 % (ref 11.5–15.5)

## 2011-09-13 LAB — SURGICAL PCR SCREEN: Staphylococcus aureus: NEGATIVE

## 2011-09-13 MED ORDER — ALBUTEROL SULFATE (5 MG/ML) 0.5% IN NEBU
2.5000 mg | INHALATION_SOLUTION | Freq: Once | RESPIRATORY_TRACT | Status: AC
  Administered 2011-09-13: 2.5 mg via RESPIRATORY_TRACT

## 2011-09-13 NOTE — Pre-Procedure Instructions (Signed)
20 Katelyn [name]  09/13/2011   Your procedure is scheduled on:  *Tuesday, March 19*  Report to [facility] at 0530 AM.  Call this number if you have problems the morning of surgery: [phone]   Remember:   Do not eat food:After Midnight.  May have clear liquids: up to 4 Hours before arrival.  Clear liquids include soda, tea, black coffee, apple or grape juice, broth.  Take these medicines the morning of surgery with A SIP OF WATER: *Celexa , Synthroid**   Do not wear jewelry, make-up or nail polish.  Do not wear lotions, powders, or perfumes. You may wear deodorant.  Do not shave 48 hours prior to surgery.  Do not bring valuables to the hospital.  Contacts, dentures or bridgework may not be worn into surgery.  Leave suitcase in the car. After surgery it may be brought to your room.  For patients admitted to the hospital, checkout time is 11:00 AM the day of discharge.   Patients discharged the day of surgery will not be allowed to drive home.     Special Instructions: CHG Shower Use Special Wash: 1/2 bottle night before surgery and 1/2 bottle morning of surgery.   Please read over the following fact sheets that you were given: Pain Booklet, Coughing and Deep Breathing, Blood Transfusion Information, MRSA Information and Surgical Site Infection Prevention

## 2011-09-16 MED ORDER — DEXTROSE 5 % IV SOLN
1.5000 g | INTRAVENOUS | Status: AC
  Administered 2011-09-17: 1.5 g via INTRAVENOUS
  Filled 2011-09-16: qty 1.5

## 2011-09-16 NOTE — Consult Note (Signed)
Anesthesia:  Patient is a 66 year old female scheduled for left VATS, LUL wedge resection versus lobectomy on 09/17/11.  History includes breast cancer (inflammatory s/p bilateral mastectomy, chemo/radiation), glomus tumor of the ear (being evaluated at [facility]), recently quit smoking (07/2011), HTN, hypothyroidism, HOH, RLS, depression, left subclavian artery stenosis s/p stenting '05.  PCP is Dr. Paulene Floor.  Oncologist is Dr. Myna Hidalgo.  Labs noted.  CXR on 09/13/11 showed:  No evidence of acute cardiopulmonary disease.  Chronic interstitial markings/emphysematous changes.  Probable scarring in the left upper lobe. Left upper rib defects.   EKG shows NSR, septal infarct (age undetermined).  Overall her EKG appears stable.  No chest pain symptoms were reported at PAT.  Clinical correlation on the day of surgery.  If remains asymptomatic then anticipate she can proceed as planned.

## 2011-09-17 ENCOUNTER — Ambulatory Visit (HOSPITAL_COMMUNITY): Payer: Medicare Other | Admitting: Vascular Surgery

## 2011-09-17 ENCOUNTER — Encounter (HOSPITAL_COMMUNITY): Payer: Self-pay | Admitting: *Deleted

## 2011-09-17 ENCOUNTER — Inpatient Hospital Stay (HOSPITAL_COMMUNITY)
Admission: RE | Admit: 2011-09-17 | Discharge: 2011-09-29 | DRG: 164 | Disposition: A | Discharge status: Home Health | Payer: Medicare Other | Source: Ambulatory Visit | Attending: Thoracic Surgery (Cardiothoracic Vascular Surgery) | Admitting: Thoracic Surgery (Cardiothoracic Vascular Surgery)

## 2011-09-17 ENCOUNTER — Ambulatory Visit (HOSPITAL_COMMUNITY): Payer: Medicare Other

## 2011-09-17 ENCOUNTER — Encounter (HOSPITAL_COMMUNITY)
Admission: RE | Disposition: A | Discharge status: Home Health | Payer: Self-pay | Source: Ambulatory Visit | Attending: Thoracic Surgery (Cardiothoracic Vascular Surgery)

## 2011-09-17 ENCOUNTER — Encounter (HOSPITAL_COMMUNITY): Payer: Self-pay | Admitting: Vascular Surgery

## 2011-09-17 DIAGNOSIS — I498 Other specified cardiac arrhythmias: Secondary | ICD-10-CM | POA: Diagnosis not present

## 2011-09-17 DIAGNOSIS — Z902 Acquired absence of lung [part of]: Secondary | ICD-10-CM

## 2011-09-17 DIAGNOSIS — D447 Neoplasm of uncertain behavior of aortic body and other paraganglia: Secondary | ICD-10-CM | POA: Diagnosis present

## 2011-09-17 DIAGNOSIS — J449 Chronic obstructive pulmonary disease, unspecified: Secondary | ICD-10-CM | POA: Diagnosis present

## 2011-09-17 DIAGNOSIS — J4489 Other specified chronic obstructive pulmonary disease: Secondary | ICD-10-CM | POA: Diagnosis present

## 2011-09-17 DIAGNOSIS — Y836 Removal of other organ (partial) (total) as the cause of abnormal reaction of the patient, or of later complication, without mention of misadventure at the time of the procedure: Secondary | ICD-10-CM | POA: Diagnosis not present

## 2011-09-17 DIAGNOSIS — E039 Hypothyroidism, unspecified: Secondary | ICD-10-CM | POA: Diagnosis present

## 2011-09-17 DIAGNOSIS — Z7982 Long term (current) use of aspirin: Secondary | ICD-10-CM

## 2011-09-17 DIAGNOSIS — Z853 Personal history of malignant neoplasm of breast: Secondary | ICD-10-CM

## 2011-09-17 DIAGNOSIS — I10 Essential (primary) hypertension: Secondary | ICD-10-CM | POA: Diagnosis present

## 2011-09-17 DIAGNOSIS — J95811 Postprocedural pneumothorax: Secondary | ICD-10-CM | POA: Diagnosis not present

## 2011-09-17 DIAGNOSIS — Z8673 Personal history of transient ischemic attack (TIA), and cerebral infarction without residual deficits: Secondary | ICD-10-CM

## 2011-09-17 DIAGNOSIS — J95812 Postprocedural air leak: Secondary | ICD-10-CM | POA: Diagnosis not present

## 2011-09-17 DIAGNOSIS — E871 Hypo-osmolality and hyponatremia: Secondary | ICD-10-CM | POA: Diagnosis not present

## 2011-09-17 DIAGNOSIS — Z79899 Other long term (current) drug therapy: Secondary | ICD-10-CM

## 2011-09-17 DIAGNOSIS — Y9210 Unspecified residential institution as the place of occurrence of the external cause: Secondary | ICD-10-CM | POA: Diagnosis not present

## 2011-09-17 DIAGNOSIS — Z87891 Personal history of nicotine dependence: Secondary | ICD-10-CM

## 2011-09-17 DIAGNOSIS — D381 Neoplasm of uncertain behavior of trachea, bronchus and lung: Secondary | ICD-10-CM

## 2011-09-17 DIAGNOSIS — C3410 Malignant neoplasm of upper lobe, unspecified bronchus or lung: Principal | ICD-10-CM | POA: Diagnosis present

## 2011-09-17 DIAGNOSIS — C3490 Malignant neoplasm of unspecified part of unspecified bronchus or lung: Secondary | ICD-10-CM

## 2011-09-17 HISTORY — PX: LUNG CANCER SURGERY: SHX702

## 2011-09-17 SURGERY — VIDEO ASSISTED THORACOSCOPY (VATS)/WEDGE RESECTION
Anesthesia: General | Site: Chest | Laterality: Left | Wound class: Clean Contaminated

## 2011-09-17 MED ORDER — HYDROMORPHONE HCL PF 1 MG/ML IJ SOLN
0.2500 mg | INTRAMUSCULAR | Status: DC | PRN
  Administered 2011-09-17 (×4): 0.5 mg via INTRAVENOUS

## 2011-09-17 MED ORDER — DEXTROSE 5 % IV SOLN
1.5000 g | Freq: Two times a day (BID) | INTRAVENOUS | Status: AC
  Administered 2011-09-17 – 2011-09-18 (×2): 1.5 g via INTRAVENOUS
  Filled 2011-09-17 (×2): qty 1.5

## 2011-09-17 MED ORDER — HYDROMORPHONE HCL PF 1 MG/ML IJ SOLN
INTRAMUSCULAR | Status: AC
  Filled 2011-09-17: qty 1

## 2011-09-17 MED ORDER — CITALOPRAM HYDROBROMIDE 40 MG PO TABS
40.0000 mg | ORAL_TABLET | Freq: Every day | ORAL | Status: DC
  Administered 2011-09-18 – 2011-09-29 (×11): 40 mg via ORAL
  Filled 2011-09-17 (×12): qty 1

## 2011-09-17 MED ORDER — GLYCOPYRROLATE 0.2 MG/ML IJ SOLN
INTRAMUSCULAR | Status: DC | PRN
  Administered 2011-09-17: .6 mg via INTRAVENOUS

## 2011-09-17 MED ORDER — OXYCODONE HCL 5 MG PO TABS
5.0000 mg | ORAL_TABLET | ORAL | Status: AC | PRN
  Administered 2011-09-17 – 2011-09-18 (×4): 5 mg via ORAL
  Filled 2011-09-17 (×4): qty 1

## 2011-09-17 MED ORDER — OXYCODONE-ACETAMINOPHEN 5-325 MG PO TABS
1.0000 | ORAL_TABLET | ORAL | Status: DC | PRN
  Administered 2011-09-18 – 2011-09-22 (×7): 1 via ORAL
  Administered 2011-09-24: 2 via ORAL
  Filled 2011-09-17 (×9): qty 1

## 2011-09-17 MED ORDER — ENOXAPARIN SODIUM 40 MG/0.4ML ~~LOC~~ SOLN: 40.0000 mg | Freq: Two times a day (BID) | SUBCUTANEOUS | Status: DC

## 2011-09-17 MED ORDER — ONDANSETRON HCL 4 MG/2ML IJ SOLN: 4.0000 mg | Freq: Four times a day (QID) | INTRAMUSCULAR | Status: DC | PRN

## 2011-09-17 MED ORDER — ONDANSETRON HCL 4 MG/2ML IJ SOLN
INTRAMUSCULAR | Status: DC | PRN
  Administered 2011-09-17: 4 mg via INTRAVENOUS

## 2011-09-17 MED ORDER — LEVOTHYROXINE SODIUM 75 MCG PO TABS
75.0000 ug | ORAL_TABLET | Freq: Every day | ORAL | Status: DC
  Administered 2011-09-18 – 2011-09-28 (×10): 75 ug via ORAL
  Filled 2011-09-17 (×13): qty 1

## 2011-09-17 MED ORDER — DEXTROSE-NACL 5-0.45 % IV SOLN
INTRAVENOUS | Status: DC
  Administered 2011-09-17 – 2011-09-19 (×4): via INTRAVENOUS

## 2011-09-17 MED ORDER — LACTATED RINGERS IV SOLN
INTRAVENOUS | Status: DC | PRN
  Administered 2011-09-17: 07:00:00 via INTRAVENOUS

## 2011-09-17 MED ORDER — SODIUM CHLORIDE 0.9 % IJ SOLN: 9.0000 mL | INTRAMUSCULAR | Status: DC | PRN

## 2011-09-17 MED ORDER — SUFENTANIL CITRATE 50 MCG/ML IV SOLN
INTRAVENOUS | Status: DC | PRN
Start: 2011-09-17 — End: 2011-09-17
  Administered 2011-09-17: 10 ug via INTRAVENOUS
  Administered 2011-09-17 (×2): 5 ug via INTRAVENOUS
  Administered 2011-09-17: 15 ug via INTRAVENOUS
  Administered 2011-09-17: 5 ug via INTRAVENOUS
  Administered 2011-09-17: 10 ug via INTRAVENOUS

## 2011-09-17 MED ORDER — ASPIRIN 325 MG PO TABS
325.0000 mg | ORAL_TABLET | Freq: Every day | ORAL | Status: DC
  Administered 2011-09-18 – 2011-09-29 (×11): 325 mg via ORAL
  Filled 2011-09-17 (×12): qty 1

## 2011-09-17 MED ORDER — HYDROCHLOROTHIAZIDE 12.5 MG PO CAPS
12.5000 mg | ORAL_CAPSULE | Freq: Every day | ORAL | Status: DC
  Administered 2011-09-18 – 2011-09-29 (×11): 12.5 mg via ORAL
  Filled 2011-09-17 (×12): qty 1

## 2011-09-17 MED ORDER — FENTANYL 10 MCG/ML IV SOLN
INTRAVENOUS | Status: DC
  Administered 2011-09-17: 10 ug via INTRAVENOUS
  Administered 2011-09-17: 70 ug via INTRAVENOUS
  Administered 2011-09-17: 20 ug via INTRAVENOUS
  Administered 2011-09-18: 60 ug via INTRAVENOUS
  Administered 2011-09-18: 110 ug via INTRAVENOUS
  Administered 2011-09-18 (×2): 130 ug via INTRAVENOUS
  Administered 2011-09-18: 09:00:00 via INTRAVENOUS
  Administered 2011-09-18: 80 ug via INTRAVENOUS
  Administered 2011-09-18 – 2011-09-19 (×2): 70 ug via INTRAVENOUS
  Administered 2011-09-19: 50 ug via INTRAVENOUS
  Administered 2011-09-19: 15:00:00 via INTRAVENOUS
  Administered 2011-09-19: 80 ug via INTRAVENOUS
  Administered 2011-09-19: 60 ug via INTRAVENOUS
  Administered 2011-09-19: 10 ug via INTRAVENOUS
  Administered 2011-09-19: 40 ug via INTRAVENOUS
  Administered 2011-09-20 (×2): 70 ug via INTRAVENOUS
  Administered 2011-09-20: 100 ug via INTRAVENOUS
  Administered 2011-09-20: 20 ug via INTRAVENOUS
  Administered 2011-09-21: 01:00:00 via INTRAVENOUS
  Administered 2011-09-21: 10 ug via INTRAVENOUS
  Administered 2011-09-21: 135 ug via INTRAVENOUS
  Administered 2011-09-21: 130 ug via INTRAVENOUS
  Administered 2011-09-21: 121.4 ug via INTRAVENOUS
  Administered 2011-09-21: 150 ug via INTRAVENOUS
  Administered 2011-09-21: 180 ug via INTRAVENOUS
  Administered 2011-09-21: 10 ug via INTRAVENOUS
  Administered 2011-09-22: 176.9 ug via INTRAVENOUS
  Administered 2011-09-22: 75 ug via INTRAVENOUS
  Administered 2011-09-22: 30 ug via INTRAVENOUS
  Administered 2011-09-22: 120 ug via INTRAVENOUS
  Administered 2011-09-22: 30 ug via INTRAVENOUS
  Administered 2011-09-22: 165 ug via INTRAVENOUS
  Administered 2011-09-23: 150 ug via INTRAVENOUS
  Administered 2011-09-23: 75 ug via INTRAVENOUS
  Administered 2011-09-23: 71.16 ug/h via INTRAVENOUS
  Administered 2011-09-23: 45 ug via INTRAVENOUS
  Administered 2011-09-23: 35 ug via INTRAVENOUS
  Administered 2011-09-24: 60 ug via INTRAVENOUS
  Administered 2011-09-24: 94.22 ug via INTRAVENOUS
  Filled 2011-09-17 (×9): qty 50

## 2011-09-17 MED ORDER — POTASSIUM CHLORIDE 10 MEQ/50ML IV SOLN
10.0000 meq | Freq: Every day | INTRAVENOUS | Status: DC | PRN
  Administered 2011-09-19: 10 meq via INTRAVENOUS
  Filled 2011-09-17: qty 50

## 2011-09-17 MED ORDER — DIPHENHYDRAMINE HCL 50 MG/ML IJ SOLN: 12.5000 mg | Freq: Four times a day (QID) | INTRAMUSCULAR | Status: DC | PRN

## 2011-09-17 MED ORDER — LABETALOL HCL 5 MG/ML IV SOLN
INTRAVENOUS | Status: DC | PRN
  Administered 2011-09-17 (×3): 5 mg via INTRAVENOUS

## 2011-09-17 MED ORDER — LISINOPRIL 10 MG PO TABS
10.0000 mg | ORAL_TABLET | Freq: Every day | ORAL | Status: DC
  Administered 2011-09-18 – 2011-09-29 (×11): 10 mg via ORAL
  Filled 2011-09-17 (×12): qty 1

## 2011-09-17 MED ORDER — ACETAMINOPHEN 10 MG/ML IV SOLN
1000.0000 mg | Freq: Four times a day (QID) | INTRAVENOUS | Status: AC
  Administered 2011-09-17 – 2011-09-18 (×4): 1000 mg via INTRAVENOUS
  Filled 2011-09-17 (×3): qty 100

## 2011-09-17 MED ORDER — NEOSTIGMINE METHYLSULFATE 1 MG/ML IJ SOLN
INTRAMUSCULAR | Status: DC | PRN
  Administered 2011-09-17: 5 mg via INTRAVENOUS

## 2011-09-17 MED ORDER — PROPOFOL 10 MG/ML IV EMUL
INTRAVENOUS | Status: DC | PRN
  Administered 2011-09-17: 150 mg via INTRAVENOUS

## 2011-09-17 MED ORDER — ATORVASTATIN CALCIUM 40 MG PO TABS
40.0000 mg | ORAL_TABLET | Freq: Every day | ORAL | Status: DC
  Administered 2011-09-18 – 2011-09-28 (×10): 40 mg via ORAL
  Filled 2011-09-17 (×13): qty 1

## 2011-09-17 MED ORDER — NALOXONE HCL 0.4 MG/ML IJ SOLN: 0.4000 mg | INTRAMUSCULAR | Status: DC | PRN

## 2011-09-17 MED ORDER — BISACODYL 5 MG PO TBEC
10.0000 mg | DELAYED_RELEASE_TABLET | Freq: Every day | ORAL | Status: DC
  Administered 2011-09-17 – 2011-09-19 (×3): 10 mg via ORAL
  Filled 2011-09-17 (×3): qty 2

## 2011-09-17 MED ORDER — SODIUM CHLORIDE 0.9 % IJ SOLN
INTRAMUSCULAR | Status: AC
  Administered 2011-09-17: 10 mL
  Filled 2011-09-17: qty 10

## 2011-09-17 MED ORDER — ROCURONIUM BROMIDE 100 MG/10ML IV SOLN
INTRAVENOUS | Status: DC | PRN
  Administered 2011-09-17: 50 mg via INTRAVENOUS

## 2011-09-17 MED ORDER — HETASTARCH-ELECTROLYTES 6 % IV SOLN
INTRAVENOUS | Status: DC | PRN
  Administered 2011-09-17: 09:00:00 via INTRAVENOUS

## 2011-09-17 MED ORDER — LISINOPRIL-HYDROCHLOROTHIAZIDE 10-12.5 MG PO TABS: 1.0000 | ORAL_TABLET | Freq: Every day | ORAL | Status: DC

## 2011-09-17 MED ORDER — SENNOSIDES-DOCUSATE SODIUM 8.6-50 MG PO TABS
1.0000 | ORAL_TABLET | Freq: Every evening | ORAL | Status: DC | PRN
  Filled 2011-09-17: qty 1

## 2011-09-17 MED ORDER — LACTATED RINGERS IV SOLN
INTRAVENOUS | Status: DC | PRN
  Administered 2011-09-17 (×2): via INTRAVENOUS

## 2011-09-17 MED ORDER — ENOXAPARIN SODIUM 40 MG/0.4ML ~~LOC~~ SOLN
40.0000 mg | SUBCUTANEOUS | Status: DC
  Administered 2011-09-17 – 2011-09-18 (×2): 40 mg via SUBCUTANEOUS
  Filled 2011-09-17 (×3): qty 0.4

## 2011-09-17 MED ORDER — ACETAMINOPHEN 10 MG/ML IV SOLN
INTRAVENOUS | Status: AC
  Filled 2011-09-17: qty 100

## 2011-09-17 MED ORDER — SODIUM CHLORIDE 0.9 % IJ SOLN
10.0000 mL | INTRAMUSCULAR | Status: DC | PRN
  Administered 2011-09-19: 20 mL
  Filled 2011-09-17: qty 20
  Filled 2011-09-17: qty 10

## 2011-09-17 MED ORDER — SODIUM CHLORIDE 0.9 % IJ SOLN
10.0000 mL | Freq: Two times a day (BID) | INTRAMUSCULAR | Status: DC
  Administered 2011-09-17 – 2011-09-20 (×5): 10 mL
  Administered 2011-09-20: 20 mL
  Administered 2011-09-21 – 2011-09-23 (×6): 10 mL
  Administered 2011-09-24: 20 mL
  Administered 2011-09-25: 12 mL
  Filled 2011-09-17: qty 20
  Filled 2011-09-17 (×3): qty 10

## 2011-09-17 MED ORDER — VECURONIUM BROMIDE 10 MG IV SOLR
INTRAVENOUS | Status: DC | PRN
  Administered 2011-09-17 (×4): 1 mg via INTRAVENOUS

## 2011-09-17 MED ORDER — EPHEDRINE SULFATE 50 MG/ML IJ SOLN
INTRAMUSCULAR | Status: DC | PRN
  Administered 2011-09-17: 5 mg via INTRAVENOUS

## 2011-09-17 MED ORDER — DIPHENHYDRAMINE HCL 12.5 MG/5ML PO ELIX
12.5000 mg | ORAL_SOLUTION | Freq: Four times a day (QID) | ORAL | Status: DC | PRN
  Filled 2011-09-17: qty 5

## 2011-09-17 MED ORDER — HEMOSTATIC AGENTS (NO CHARGE) OPTIME
TOPICAL | Status: DC | PRN
  Administered 2011-09-17: 1 via TOPICAL

## 2011-09-17 MED ORDER — LEVALBUTEROL HCL 0.63 MG/3ML IN NEBU
0.6300 mg | INHALATION_SOLUTION | Freq: Four times a day (QID) | RESPIRATORY_TRACT | Status: DC
  Administered 2011-09-17: 0.63 mg via RESPIRATORY_TRACT
  Filled 2011-09-17 (×3): qty 3

## 2011-09-17 MED ORDER — MIDAZOLAM HCL 5 MG/5ML IJ SOLN
INTRAMUSCULAR | Status: DC | PRN
  Administered 2011-09-17: 0.5 mg via INTRAVENOUS
  Administered 2011-09-17: 1 mg via INTRAVENOUS
  Administered 2011-09-17: 0.5 mg via INTRAVENOUS

## 2011-09-17 SURGICAL SUPPLY — 67 items
ADH SKN CLS APL DERMABOND .7 (GAUZE/BANDAGES/DRESSINGS) ×1
CANISTER SUCTION 2500CC (MISCELLANEOUS) ×4 IMPLANT
CATH KIT ON Q 5IN SLV (PAIN MANAGEMENT) IMPLANT
CATH THORACIC 28FR (CATHETERS) IMPLANT
CATH THORACIC 36FR (CATHETERS) IMPLANT
CATH THORACIC 36FR RT ANG (CATHETERS) IMPLANT
CLIP TI MEDIUM 6 (CLIP) ×2 IMPLANT
CLOTH BEACON ORANGE TIMEOUT ST (SAFETY) ×2 IMPLANT
CONT SPEC 4OZ CLIKSEAL STRL BL (MISCELLANEOUS) ×6 IMPLANT
DERMABOND ADVANCED (GAUZE/BANDAGES/DRESSINGS) ×1
DERMABOND ADVANCED .7 DNX12 (GAUZE/BANDAGES/DRESSINGS) IMPLANT
DRAIN CHANNEL 28F RND 3/8 FF (WOUND CARE) IMPLANT
DRAIN CHANNEL 32F RND 10.7 FF (WOUND CARE) IMPLANT
DRAPE LAPAROSCOPIC ABDOMINAL (DRAPES) ×2 IMPLANT
DRAPE SLUSH MACHINE 52X66 (DRAPES) ×2 IMPLANT
DRAPE SLUSH/WARMER DISC (DRAPES) IMPLANT
ELECT REM PT RETURN 9FT ADLT (ELECTROSURGICAL) ×2
ELECTRODE REM PT RTRN 9FT ADLT (ELECTROSURGICAL) ×1 IMPLANT
GLOVE EUDERMIC 7 POWDERFREE (GLOVE) ×4 IMPLANT
GOWN PREVENTION PLUS XLARGE (GOWN DISPOSABLE) ×4 IMPLANT
GOWN STRL NON-REIN LRG LVL3 (GOWN DISPOSABLE) ×6 IMPLANT
HANDLE STAPLE ENDO GIA SHORT (STAPLE) ×1
KIT BASIN OR (CUSTOM PROCEDURE TRAY) ×2 IMPLANT
KIT ROOM TURNOVER OR (KITS) ×2 IMPLANT
KIT SUCTION CATH 14FR (SUCTIONS) ×3 IMPLANT
NS IRRIG 1000ML POUR BTL (IV SOLUTION) ×4 IMPLANT
PACK CHEST (CUSTOM PROCEDURE TRAY) ×2 IMPLANT
PAD ARMBOARD 7.5X6 YLW CONV (MISCELLANEOUS) ×4 IMPLANT
POUCH ENDO CATCH II 15MM (MISCELLANEOUS) IMPLANT
RELOAD EGIA 60 MED/THCK PURPLE (STAPLE) ×10 IMPLANT
RELOAD STAPLE 60 MED/THCK ART (STAPLE) IMPLANT
SEALANT PROGEL (MISCELLANEOUS) IMPLANT
SEALANT SURG COSEAL 4ML (VASCULAR PRODUCTS) IMPLANT
SEALANT SURG COSEAL 8ML (VASCULAR PRODUCTS) IMPLANT
SOLUTION ANTI FOG 6CC (MISCELLANEOUS) ×4 IMPLANT
SPECIMEN JAR MEDIUM (MISCELLANEOUS) ×2 IMPLANT
SPONGE GAUZE 4X4 12PLY (GAUZE/BANDAGES/DRESSINGS) ×2 IMPLANT
STAPLER ENDO GIA 12 SHRT THIN (STAPLE) IMPLANT
STAPLER ENDO GIA 12MM SHORT (STAPLE) ×1 IMPLANT
SUT PROLENE 4 0 RB 1 (SUTURE) ×2
SUT PROLENE 4-0 RB1 .5 CRCL 36 (SUTURE) IMPLANT
SUT SILK  1 MH (SUTURE) ×2
SUT SILK 1 MH (SUTURE) ×1 IMPLANT
SUT SILK 2 0 SH CR/8 (SUTURE) ×1 IMPLANT
SUT SILK 2 0SH CR/8 30 (SUTURE) IMPLANT
SUT SILK 3 0SH CR/8 30 (SUTURE) IMPLANT
SUT VIC AB 0 CTX 27 (SUTURE) IMPLANT
SUT VIC AB 1 CTX 27 (SUTURE) ×2 IMPLANT
SUT VIC AB 2-0 CT1 27 (SUTURE) ×2
SUT VIC AB 2-0 CT1 TAPERPNT 27 (SUTURE) IMPLANT
SUT VIC AB 2-0 CTX 36 (SUTURE) IMPLANT
SUT VIC AB 3-0 MH 27 (SUTURE) ×2 IMPLANT
SUT VIC AB 3-0 SH 27 (SUTURE)
SUT VIC AB 3-0 SH 27X BRD (SUTURE) IMPLANT
SUT VIC AB 3-0 X1 27 (SUTURE) ×4 IMPLANT
SUT VICRYL 0 UR6 27IN ABS (SUTURE) ×3 IMPLANT
SUT VICRYL 2 TP 1 (SUTURE) IMPLANT
SWAB COLLECTION DEVICE MRSA (MISCELLANEOUS) IMPLANT
SYSTEM SAHARA CHEST DRAIN ATS (WOUND CARE) ×2 IMPLANT
TIP APPLICATOR SPRAY EXTEND 16 (VASCULAR PRODUCTS) IMPLANT
TOWEL OR 17X24 6PK STRL BLUE (TOWEL DISPOSABLE) ×3 IMPLANT
TOWEL OR 17X26 10 PK STRL BLUE (TOWEL DISPOSABLE) ×4 IMPLANT
TRAP SPECIMEN MUCOUS 40CC (MISCELLANEOUS) IMPLANT
TRAY FOLEY CATH 14FR (SET/KITS/TRAYS/PACK) ×2 IMPLANT
TUBE ANAEROBIC SPECIMEN COL (MISCELLANEOUS) IMPLANT
TUNNELER SHEATH ON-Q 11GX8 (MISCELLANEOUS) ×2 IMPLANT
WATER STERILE IRR 1000ML POUR (IV SOLUTION) ×4 IMPLANT

## 2011-09-17 NOTE — Anesthesia Procedure Notes (Addendum)
Procedure Name: Intubation Date/Time: 09/17/2011 7:40 AM Performed by: Lovie Chol Pre-anesthesia Checklist: Patient identified, Emergency Drugs available, Suction available and Patient being monitored Patient Re-evaluated:Patient Re-evaluated prior to inductionOxygen Delivery Method: Circle system utilized Preoxygenation: Pre-oxygenation with 100% oxygen Intubation Type: IV induction Ventilation: Mask ventilation without difficulty Grade View: Grade I Endobronchial tube: Left, EBT position confirmed by fiberoptic bronchoscope and Double lumen EBT and 37 Fr Number of attempts: 1 Airway Equipment and Method: Video-laryngoscopy Placement Confirmation: positive ETCO2 and breath sounds checked- equal and bilateral Secured at: 29 cm Tube secured with: Tape Dental Injury: Teeth and Oropharynx as per pre-operative assessment     [id number]: The patient was identified and consent obtained.  TO was performed, and full barrier precautions were used.  The skin was anesthetized with lidocaine.  Once the vein was located with the 22 ga. needle using ultrasound guidance , the wire was inserted into the vein.  The wire location was confirmed with ultrasound.  The tissue was dilated and the catheter was carefully inserted, then sutured in place. A dressing was applied. The patient tolerated the procedure well.   CE

## 2011-09-17 NOTE — Transfer of Care (Signed)
Immediate Anesthesia Transfer of Care Note  Patient: Katelyn [name]  Procedure(s) Performed: Procedure(s) (LRB): VIDEO ASSISTED THORACOSCOPY (VATS)/WEDGE RESECTION (Left)  Patient Location: PACU  Anesthesia Type: General  Level of Consciousness: awake and alert   Airway & Oxygen Therapy: Patient Spontanous Breathing and Patient connected to face mask oxygen  Post-op Assessment: Report given to PACU RN and Post -op Vital signs reviewed and stable  Post vital signs: Reviewed  Complications: No apparent anesthesia complications

## 2011-09-17 NOTE — Brief Op Note (Addendum)
09/17/2011  10:10 AM  PATIENT:  Katelyn [name]  66 y.o. female  PRE-OPERATIVE DIAGNOSIS:  lung nodules  POST-OPERATIVE DIAGNOSIS:  lung nodules  PROCEDURE:  Procedure(s) (LRB): VIDEO ASSISTED THORACOSCOPY (VATS)/WEDGE RESECTION LUL Posterior Segment and LUL Anterior Segment  SURGEON:  Surgeon(s) and Role:    * Loreli Slot, MD - Primary  PHYSICIAN ASSISTANT: Lowella Dandy PA-C   ANESTHESIA:   general  EBL:  Total I/O In: 800 [I.V.:800] Out: 375 [Urine:300; Blood:75]  DRAINS: 28 F Chest tube  SPECIMEN:  Source of Specimen:  LUL Posterior Segment Wedge, LUL Anterior Segment Wedge, Lymph node station 5  DISPOSITION OF SPECIMEN:  PATHOLOGY  PATIENT DISPOSITION:  PACU - hemodynamically stable.   Frozen section on posterior mass-  Inflammation, radiation change with squamous differentiation, no definite cancer

## 2011-09-17 NOTE — Addendum Note (Signed)
Addendum  created 09/17/11 1308 by Lovie Chol, CRNA   Modules edited:Charges VN

## 2011-09-17 NOTE — Anesthesia Postprocedure Evaluation (Signed)
Anesthesia Post Note  Patient: Katelyn [name]  Procedure(s) Performed: Procedure(s) (LRB): VIDEO ASSISTED THORACOSCOPY (VATS)/WEDGE RESECTION (Left)  Anesthesia type: General  Patient location: PACU  Post pain: Pain level controlled and Adequate analgesia  Post assessment: Post-op Vital signs reviewed, Patient's Cardiovascular Status Stable, Respiratory Function Stable, Patent Airway and Pain level controlled  Last Vitals:  Filed Vitals:   09/17/11 1118  BP: 154/66  Pulse: 79  Temp:   Resp: 19    Post vital signs: Reviewed and stable  Level of consciousness: awake, alert  and oriented  Complications: No apparent anesthesia complications

## 2011-09-17 NOTE — Preoperative (Signed)
Beta Blockers   Reason not to administer Beta Blockers:Not Applicable 

## 2011-09-17 NOTE — Interval H&P Note (Signed)
History and Physical Interval Note:  09/17/2011 7:27 AM  Katelyn [name]  has presented today for surgery, with the diagnosis of lung nodules  The various methods of treatment have been discussed with the patient and family. After consideration of risks, benefits and other options for treatment, the patient has consented to  Procedure(s) (LRB): VIDEO ASSISTED THORACOSCOPY (VATS)/WEDGE RESECTION (Left) as a surgical intervention .  The patients' history has been reviewed, patient examined, no change in status, stable for surgery.  I have reviewed the patients' chart and labs.  Questions were answered to the patient's satisfaction.     Roxanna Mcever C

## 2011-09-17 NOTE — Anesthesia Preprocedure Evaluation (Addendum)
Anesthesia Evaluation  Patient identified by MRN, date of birth, ID band Patient awake    Reviewed: Allergy & Precautions, H&P , NPO status , Patient's Chart, lab work & pertinent test results  History of Anesthesia Complications Negative for: history of anesthetic complications  Airway Mallampati: II TM Distance: <3 FB Neck ROM: full  Mouth opening: Limited Mouth Opening  Dental  (+) Poor Dentition and Dental Advisory Given   Pulmonary shortness of breath and with exertion, COPDformer smoker         Cardiovascular hypertension, Pt. on medications     Neuro/Psych    GI/Hepatic   Endo/Other  Hypothyroidism   Renal/GU      Musculoskeletal   Abdominal   Peds  Hematology   Anesthesia Other Findings   Reproductive/Obstetrics                         Anesthesia Physical Anesthesia Plan  ASA: III  Anesthesia Plan: General   Post-op Pain Management:    Induction: Intravenous  Airway Management Planned: Double Lumen EBT  Additional Equipment: Arterial line and CVP  Intra-op Plan:   Post-operative Plan: Extubation in OR  Informed Consent: I have reviewed the patients History and Physical, chart, labs and discussed the procedure including the risks, benefits and alternatives for the proposed anesthesia with the patient or authorized representative who has indicated his/her understanding and acceptance.     Plan Discussed with: CRNA and Surgeon  Anesthesia Plan Comments:         Anesthesia Quick Evaluation

## 2011-09-17 NOTE — H&P (Signed)
PCP is MARTIN,MARY C, MD, MD Referring Provider is Ennever, Peter R, MD  Chief Complaint  Patient presents with  . 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 She essentially had a PET CT done at [facility]. We do not have a formal report from that, but there were 2 irregular nodular densities near the left apex, one anteriorly and one posteriorly, that did have hypermetabolic uptake. I do not have the SUV of these lesions, as the formal report is not available. She denies weight loss, in fact has gained weight since she stopped smoking in January. She denies cough, hemoptysis, fevers or chills, exposure to TB or fungal sources.  She is scheduled to have cyber knife radiation therapy to her glomus tumor on 09/10/2011   Past Medical History  Diagnosis Date  . 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 Pulmonary nodule, left 08/26/2011  Inflammatory breast cancer, rx with surgery, radiation and chemotherapy   Bilateral mastectomy  No family history on file.  Social History History  Substance Use Topics  . Smoking status: Former Smoker -- 1.0 packs/day for 40 years    Quit date: 07/09/2011  . Smokeless tobacco: Former User    Quit date: 07/09/2011  . Alcohol Use: No    Current Outpatient Prescriptions  Medication Sig Dispense Refill  . aspirin 325 MG tablet Take 325 mg by mouth daily.        . atorvastatin (LIPITOR) 40 MG tablet Take 40 mg by mouth Daily.      . Cholecalciferol (VITAMIN D) 2000 UNITS CAPS Take 2,000 capsules by mouth daily.        . citalopram (CELEXA) 40 MG tablet Take 40 mg by mouth daily.        . levothyroxine (SYNTHROID, LEVOTHROID) 75 MCG tablet  Take 75 mcg by mouth daily.        . lisinopril-hydrochlorothiazide (PRINZIDE,ZESTORETIC) 10-12.5 MG per tablet Take 1 tablet by mouth daily.        . pramipexole (MIRAPEX) 0.125 MG tablet 0.125 mg Three times a day.        No Known Allergies  Review of Systems  BP 126/74  Pulse 76  Resp 16  Ht 5' 7" (1.702 m)  Wt 165 lb (74.844 kg)  BMI 25.84 kg/m2  SpO2 95% Physical Exam  Constitutional: She is oriented to person, place, and time. She appears well-developed and well-nourished. No distress.  HENT:  Head: Normocephalic and atraumatic.  Eyes: EOM are normal. Pupils are equal, round, and reactive to light.  Neck: Normal range of motion. Neck supple. No JVD present. No thyromegaly present.  Cardiovascular: Normal rate, regular rhythm and normal heart sounds.   No murmur heard. Pulmonary/Chest: Effort normal and breath sounds normal. No respiratory distress. She has no wheezes. She has no rales.       Left clavicle deformity Prior mastectomy with well healed scars  Abdominal: Soft. There is no tenderness.  Musculoskeletal: She exhibits no edema.  Lymphadenopathy:    She has no cervical adenopathy.  Neurological: She is alert and oriented to person, place, and time.       No focal motor deficits    Skin: Skin is warm and dry.     Diagnostic Tests: PET/CT from [facility] reviewed. The formal report not available. 2 irregular densities at the left apex, one anterior and one posterior, hypermetabolic by PET  Impression: Mrs. Lyerly is a 66-year-old woman with a history of tobacco abuse and also history of inflammatory left breast cancer treated with surgery, radiation and chemotherapy in 1996. She now has to incidentally found nodules in her left apex these are relatively close to the chest wall albeit on opposite sides of the lung. The differential diagnosis includes primary lung cancer, sarcoma due to previous radiation, infection (bacterial, fungal, TB) or inflammatory  disease. One issue in her case is that she would not be a candidate for radiation to the area due to the previous radiation for her inflammatory breast cancer.  I reviewed the PET scan with Mrs. Skelley, her daughter and her daughter-in-law. We also discussed the differential diagnosis as well as diagnostic and therapeutic options. I did stress that one option would be to follow this with serial scans to see if there was any growth or other change. A second option would be to attempt a transthoracic needle aspiration. Unfortunately with a TTNA if they were positive she would need surgery she's not a candidate for radiation, and this too higher incidence of false negative to disregard the lesions if the biopsies were negative. They understand the only way to definitively is to do an excisional biopsy.  I discussed with them the option of a left VATS with wedge resection of the 2 nodules, followed by possible lobectomy if it were indicated based on intraoperative frozen section findings. They understand indications, risks, benefits, and alternatives, as well as the intraoperative decision making. They understand the need for general anesthesia, incisions were used, possibility of needing a thoracotomy due to scar tissue from previous radiation, expected hospital stay, and overall recovery. They understand the risks include but are not limited to death, MI, DVT, PE, infection, leading, possible need for transfusion, prolonged air leak, cardiac arrhythmias, as well as unforeseeable or unpredictable complications.  Plan: After discussion Mrs. Disbrow wishes to proceed with surgical biopsy, and if indicated definitive treatment. She would not feel comfortable with observation given her prior history. She is scheduled to have cyber knife treatment of her glomus tumor next week. We will tentatively plan to proceed with left VATS, wedge resection, possible lobectomy on Tuesday, March 19. She would be admitted the day of  surgery 

## 2011-09-18 ENCOUNTER — Inpatient Hospital Stay (HOSPITAL_COMMUNITY): Payer: Medicare Other

## 2011-09-18 LAB — BLOOD GAS, ARTERIAL
Bicarbonate: 23.7 mEq/L (ref 20.0–24.0)
O2 Saturation: 88.9 %
pO2, Arterial: 52.9 mmHg — ABNORMAL LOW (ref 80.0–100.0)

## 2011-09-18 LAB — CBC
HCT: 37.1 % (ref 36.0–46.0)
Hemoglobin: 12.4 g/dL (ref 12.0–15.0)
MCHC: 33.4 g/dL (ref 30.0–36.0)

## 2011-09-18 LAB — BASIC METABOLIC PANEL
CO2: 27 mEq/L (ref 19–32)
Chloride: 103 mEq/L (ref 96–112)
Creatinine, Ser: 0.82 mg/dL (ref 0.50–1.10)
Sodium: 136 mEq/L (ref 135–145)

## 2011-09-18 MED ORDER — PRAMIPEXOLE DIHYDROCHLORIDE 0.125 MG PO TABS
0.1250 mg | ORAL_TABLET | Freq: Every day | ORAL | Status: DC
  Administered 2011-09-18 – 2011-09-28 (×10): 0.125 mg via ORAL
  Filled 2011-09-18 (×13): qty 1

## 2011-09-18 MED ORDER — METOCLOPRAMIDE HCL 5 MG/ML IJ SOLN
10.0000 mg | Freq: Four times a day (QID) | INTRAMUSCULAR | Status: DC | PRN
  Filled 2011-09-18: qty 2

## 2011-09-18 MED ORDER — LEVALBUTEROL HCL 0.63 MG/3ML IN NEBU
0.6300 mg | INHALATION_SOLUTION | Freq: Four times a day (QID) | RESPIRATORY_TRACT | Status: DC | PRN
  Filled 2011-09-18: qty 3

## 2011-09-18 MED ORDER — PNEUMOCOCCAL VAC POLYVALENT 25 MCG/0.5ML IJ INJ: 0.5000 mL | INJECTION | INTRAMUSCULAR | Status: DC | PRN

## 2011-09-18 NOTE — Progress Notes (Signed)
1 Day Post-Op Procedure(s) (LRB): VIDEO ASSISTED THORACOSCOPY (VATS)/WEDGE RESECTION (Left) Subjective: C/o some incisional pain, controlled with PCA Denies nausea Has loose teeth and "had a tooth knocked out"  Objective: Vital signs in last 24 hours: Temp:  [97 F (36.1 C)-98.8 F (37.1 C)] 98 F (36.7 C) (03/20 0420) Pulse Rate:  [43-93] 87  (03/20 0420) Cardiac Rhythm:  [-] Normal sinus rhythm (03/20 0420) Resp:  [12-24] 18  (03/20 0838) BP: (112-157)/(44-75) 144/61 mmHg (03/20 0420) SpO2:  [82 %-100 %] 100 % (03/20 0838) Arterial Line BP: (66-190)/(40-93) 116/93 mmHg (03/20 0420) Weight:  [174 lb 13.2 oz (79.3 kg)] 174 lb 13.2 oz (79.3 kg) (03/19 1700)  Hemodynamic parameters for last 24 hours:    Intake/Output from previous day: 03/19 0701 - 03/20 0700 In: 3495 [I.V.:2845; IV Piggyback:650] Out: 1385 [Urine:1000; Blood:75; Chest Tube:310] Intake/Output this shift:    General appearance: alert and no distress Neurologic: intact Heart: regular rate and rhythm Lungs: diminished breath sounds bibasilar and rhonchi bilaterally Abdomen: mildly distended, hypoactive BS  Lab Results:  Basename 09/18/11 0430  WBC 15.2*  HGB 12.4  HCT 37.1  PLT 184   BMET:  Basename 09/18/11 0430  NA 136  K 4.0  CL 103  CO2 27  GLUCOSE 137*  BUN 13  CREATININE 0.82  CALCIUM 8.9    PT/INR: No results found for this basename: LABPROT,INR in the last 72 hours ABG    Component Value Date/Time   PHART 7.398 09/18/2011 0535   HCO3 23.7 09/18/2011 0535   TCO2 25.0 09/18/2011 0535   ACIDBASEDEF 0.5 09/18/2011 0535   O2SAT 88.9 09/18/2011 0535   CBG (last 3)  No results found for this basename: GLUCAP:3 in the last 72 hours  Assessment/Plan: S/P Procedure(s) (LRB): VIDEO ASSISTED THORACOSCOPY (VATS)/WEDGE RESECTION (Left) - CV- stable RESP- s/p wedge resection, path pending, needs aggressive pulmonary toilet, combivent PRN  Keep both CT today has tiny air leak RENAL- lytes,  creatinine OK Mobilize   LOS: 1 day    Katelyn [name] C 09/18/2011

## 2011-09-18 NOTE — Op Note (Signed)
Katelyn [name], Katelyn [name] NO.:  [id number]  MEDICAL RECORD NO.:  [id number]  LOCATION:  3304                         FACILITY:  [facility]  PHYSICIAN:  Salvatore Decent. Dorris Fetch, M.D.DATE OF BIRTH:  09/16/1945  DATE OF PROCEDURE:  09/17/2011 DATE OF DISCHARGE:                              OPERATIVE REPORT   PREOPERATIVE DIAGNOSIS:  Left upper lobe nodules.  POSTOPERATIVE DIAGNOSES:  Left upper lobe nodules.  PROCEDURE:  Left video-assisted thoracoscopic wedge resection x2 of left upper lobe.  SURGEON:  Salvatore Decent. Dorris Fetch, MD  ASSISTANT:  Lowella Dandy.  ANESTHESIA:  General.  FINDINGS:  Upper lobe with diffusely nodular texture.  No clearly palpable anterior nodule.  Posterior nodule large associated with surrounding inflammatory mass approximately 3 cm in size.  Total frozen section revealed inflammatory changes and radiation changes with squamous differentiation.  No definite tumor.  Anterior portion of left upper lobe resected with a large wedge resection as , there was no discrete palpable mass in the underlying nodular lung.  CLINICAL NOTE:  Katelyn [name] is a 66 year old woman with a history of inflammatory breast cancer.  The patient was being worked up for a glomus tumor, and a CT angiogram of the neck showed a possible lung nodule.  She had a PET-CT which showed 2 irregular nodular densities, one anteriorly and one posteriorly near the left apex.  These were hypermetabolic.  We discussed the options of following the nodules, we are proceeding with wedge resection for diagnostic purposes with a possibility of proceeding with a lobectomy if the lesions were cancerous.  She wished to pursue an aggressive surgical approach.  The indications, risks, benefits, and alternative treatments were discussed in detail with her.  She understood and accepted the risks and agreed to proceed.  OPERATIVE NOTE:  Katelyn [name] was brought to the preop holding area on September 17, 2011.  There, the Anesthesia Service placed a central line and arterial blood pressure monitoring line.  Intravenous antibiotics were administered.  She was taken to the operating room.  PAS hoses were placed for DVT prophylaxis.  She was anesthetized and intubated with a double-lumen endotracheal tube.  Foley catheter was placed.  She was placed in a right lateral decubitus position, and the left chest was prepped and draped in usual sterile fashion.  Single lung ventilation of the right lung was carried out and was tolerated well throughout the procedure.  An incision was made in the midaxillary line approximately the seventh intercostal space.  This was carried through the skin and subcutaneous tissue.  The chest was entered bluntly using a hemostat.  A port was inserted, and the thoracoscope was placed into the chest.  There was good separation of the lungs.  A small utility thoracotomy was made anteriorly.  No rib spreading was done during the case.  There were adhesions laterally of the lung to the chest wall.  Thin, filmy adhesions were taken down with electrocautery.  These adhesions were below the level of the palpable mass.  After freeing up the left upper lobe, palpation was performed of the upper lobe.  There was a palpable mass which appeared larger than what was visualized on CT  Posteriorly. The entire upper lobe had a nodular texture to it and no discrete anterior nodule was identified.  A second port incision was made anteriorly in the seventh intercostal space.  The posterior mass was grasped and removed with a generous wedge resection with sequential firings of an Endo-GIA stapler.  The specimen was removed and sent for frozen section.  While awaiting the frozen section, continued attempts were made to palpate the more anterior nodule; however, no discrete nodule could be palpated definitively in the area of that lesion on the PET/CT.  There was a palpable abnormality  anteriorly in the left upper lobe, but this really felt smaller than what was apparent on the CT scan.  The anterior portion of the left upper lobe was taken with sequential firings of an Endo-GIA stapler as a wedge resection with the staple line meeting the previous posterior staple line.  The palpable abnormalities within the specimen were marked with sutures and the specimen was sent for permanent section.  While awaiting the results of the frozen section, a lymph node was visible in the aortopulmonary window.  The pleura was incised over this, and there were actually 2 nodes present.  These were taken and sent as separate specimens.  This area was packed with Surgicel to achieve hemostasis.  Frozen section returned with significant inflammatory changes.  There was evidence of radiation injury with squamous differentiation.  There was no definite cancer.  Given the findings, it was elected not to proceed with lobectomy.  The staple lines were inspected.  A 28-French chest tube was placed through the anterior port incision and secured with a #1 silk suture.  The left lung was reinflated after ensuring good aeration of both lobes.  The scope was withdrawn.  The remaining port incisions were closed with a #1 Vicryl fascial suture, followed by a 3-0 Vicryl subcuticular suture. The anterior mini-thoracotomy incision was closed with a running #1 Vicryl fascial suture, followed by 2-0 Vicryl subcutaneous suture, and a 3-0 Vicryl subcuticular suture.  All sponge, needle, instrument counts were correct at the end of the procedure.  The patient was taken from the operating room to the postanesthetic care unit in good condition.     Salvatore Decent Dorris Fetch, M.D.     SCH/MEDQ  D:  09/17/2011  T:  09/18/2011  Job:  [id number]

## 2011-09-18 NOTE — Progress Notes (Signed)
UR Completed.  Katelyn [name] Jane [phone] 09/18/2011  

## 2011-09-18 NOTE — Progress Notes (Addendum)
1 Day Post-Op Procedure(s) (LRB): VIDEO ASSISTED THORACOSCOPY (VATS)/WEDGE RESECTION (Left)  Subjective: Patient with some incisional pain this am.  Objective: Vital signs in last 24 hours: Patient Vitals for the past 24 hrs:  BP Temp Temp src Pulse Resp SpO2 Height Weight  09/18/11 0420 144/61 mmHg 98 F (36.7 C) Oral 87  24  93 % - -  09/18/11 0400 - - - - 23  95 % - -  09/17/11 2340 142/61 mmHg 98.3 F (36.8 C) Oral 89  21  96 % - -  09/17/11 2336 - - - - 14  97 % - -  09/17/11 2101 - - - 82  18  99 % - -  09/17/11 2000 - - - - 13  99 % - -  09/17/11 1920 129/60 mmHg 98.4 F (36.9 C) Oral 81  21  100 % - -  09/17/11 1900 - - - 77  13  99 % - -  09/17/11 1830 - - - 79  13  98 % - -  09/17/11 1800 - - - 78  12  95 % - -  09/17/11 1730 124/55 mmHg - - 77  12  97 % - -  09/17/11 1700 121/49 mmHg 98.2 F (36.8 C) Oral 79  12  96 % 5\' 7"  (1.702 m) 174 lb 13.2 oz (79.3 kg)  09/17/11 1630 134/55 mmHg - - 83  18  97 % - -  09/17/11 1536 - 98.8 F (37.1 C) - - - - - -  09/17/11 1533 117/49 mmHg - - 81  13  97 % - -  09/17/11 1530 - - - 83  12  97 % - -  09/17/11 1518 112/44 mmHg - - 80  13  98 % - -  09/17/11 1515 - - - 79  13  98 % - -  09/17/11 1503 131/52 mmHg - - 93  19  98 % - -  09/17/11 1500 - - - 84  14  99 % - -  09/17/11 1448 122/52 mmHg - - 82  12  98 % - -  09/17/11 1445 - - - 81  12  98 % - -  09/17/11 1433 117/55 mmHg - - 81  14  97 % - -  09/17/11 1430 - - - 89  16  98 % - -  09/17/11 1418 120/53 mmHg - - 83  16  98 % - -  09/17/11 1415 - - - 82  15  97 % - -  09/17/11 1403 125/56 mmHg - - 82  16  97 % - -  09/17/11 1400 - - - 79  16  97 % - -  09/17/11 1348 119/51 mmHg 98.7 F (37.1 C) - 81  16  97 % - -  09/17/11 1345 - - - 82  16  96 % - -  09/17/11 1333 133/54 mmHg - - 79  17  97 % - -  09/17/11 1330 - - - 78  16  97 % - -  09/17/11 1318 134/51 mmHg - - 81  19  98 % - -  09/17/11 1315 - - - 80  19  97 % - -  09/17/11 1303 136/51 mmHg - - 77  18  96 % - -    09/17/11 1300 - - - 78  19  96 % - -  09/17/11 1248 146/59 mmHg - - 83  18  97 % - -  09/17/11 1245 - 97 F (36.1 C) - 79  18  96 % - -  09/17/11 1233 149/56 mmHg - - 78  19  95 % - -  09/17/11 1230 - - - 76  19  96 % - -  09/17/11 1218 143/56 mmHg - - 74  17  95 % - -  09/17/11 1215 - - - 76  18  95 % - -  09/17/11 1203 141/52 mmHg - - 76  20  94 % - -  09/17/11 1200 - - - 74  19  94 % - -  09/17/11 1148 152/55 mmHg - - 74  20  95 % - -  09/17/11 1145 - - - 76  21  95 % - -  09/17/11 1133 151/60 mmHg - - 72  17  96 % - -  09/17/11 1130 - - - 76  18  97 % - -  09/17/11 1118 154/66 mmHg - - 79  19  98 % - -  09/17/11 1115 - - - 79  19  97 % - -  09/17/11 1103 157/72 mmHg - - 76  19  100 % - -  09/17/11 1100 - - - 72  21  100 % - -  09/17/11 1048 140/75 mmHg - - 75  23  99 % - -  09/17/11 1045 - - - 43  24  82 % - -  09/17/11 1043 151/63 mmHg 97.4 F (36.3 C) - - - - - -    Current Weight  09/17/11 174 lb 13.2 oz (79.3 kg)      Intake/Output from previous day: 03/19 0701 - 03/20 0700 In: 3495 [I.V.:2845; IV Piggyback:650] Out: 1385 [Urine:1000; Blood:75; Chest Tube:310]   Physical Exam:  Cardiovascular: RRR, no murmurs, gallops, or rubs. Pulmonary:Diminished at bases; no rales, wheezes, or rhonchi. Abdomen: Soft, non tender, bowel sounds present. Extremities: Trace bilateral lower extremity edema. Wounds: Dressing with dried serosanguinous ooze.  Lab Results: CBC: Basename 09/18/11 0430  WBC 15.2*  HGB 12.4  HCT 37.1  PLT 184   BMET:  Basename 09/18/11 0430  NA 136  K 4.0  CL 103  CO2 27  GLUCOSE 137*  BUN 13  CREATININE 0.82  CALCIUM 8.9    PT/INR: No results found for this basename: LABPROT,INR in the last 72 hours ABG:  INR: Will add last result for INR, ABG once components are confirmed Will add last 4 CBG results once components are confirmed  Assessment/Plan: 1.Pulmonary-Chest tube with 310 cc of output last 24 hours. There is no air leak.  CXR this am shows no pneumothorax, some pulmonary vascular congestion.Pathology is pending. 2.Reduce IVF and advance diet as tolerates 3.Remove a line.    Leylani Duley MPA-C 09/18/2011

## 2011-09-19 ENCOUNTER — Inpatient Hospital Stay (HOSPITAL_COMMUNITY): Payer: Medicare Other

## 2011-09-19 LAB — CBC
HCT: 35.5 % — ABNORMAL LOW (ref 36.0–46.0)
HCT: 34.7 % — ABNORMAL LOW (ref 36.0–46.0)
Hemoglobin: 11.9 g/dL — ABNORMAL LOW (ref 12.0–15.0)
MCHC: 33.8 g/dL (ref 30.0–36.0)
MCHC: 34.3 g/dL (ref 30.0–36.0)
MCV: 97.3 fL (ref 78.0–100.0)
MCV: 95.9 fL (ref 78.0–100.0)
RDW: 13.7 % (ref 11.5–15.5)

## 2011-09-19 LAB — URINALYSIS, ROUTINE W REFLEX MICROSCOPIC
Glucose, UA: NEGATIVE mg/dL
Specific Gravity, Urine: 1.009 (ref 1.005–1.030)
Urobilinogen, UA: 0.2 mg/dL (ref 0.0–1.0)
pH: 6.5 (ref 5.0–8.0)

## 2011-09-19 LAB — BLOOD GAS, ARTERIAL
Acid-Base Excess: 3.9 mmol/L — ABNORMAL HIGH (ref 0.0–2.0)
Drawn by: 35135
FIO2: 0.21 %
O2 Saturation: 87 %
Patient temperature: 98.6

## 2011-09-19 LAB — URINE MICROSCOPIC-ADD ON

## 2011-09-19 LAB — COMPREHENSIVE METABOLIC PANEL
Albumin: 2.6 g/dL — ABNORMAL LOW (ref 3.5–5.2)
Alkaline Phosphatase: 50 U/L (ref 39–117)
BUN: 8 mg/dL (ref 6–23)
BUN: 9 mg/dL (ref 6–23)
Chloride: 92 mEq/L — ABNORMAL LOW (ref 96–112)
Creatinine, Ser: 0.65 mg/dL (ref 0.50–1.10)
Creatinine, Ser: 0.68 mg/dL (ref 0.50–1.10)
GFR calc Af Amer: >90 mL/min (ref >90)
GFR calc Af Amer: >90 mL/min (ref >90)
Glucose, Bld: 126 mg/dL — ABNORMAL HIGH (ref 70–99)
Potassium: 3.6 mEq/L (ref 3.5–5.1)
Total Bilirubin: 0.7 mg/dL (ref 0.3–1.2)
Total Bilirubin: 0.6 mg/dL (ref 0.3–1.2)
Total Protein: 5.6 g/dL — ABNORMAL LOW (ref 6.0–8.3)
Total Protein: 6 g/dL (ref 6.0–8.3)

## 2011-09-19 MED ORDER — POTASSIUM CHLORIDE CRYS ER 20 MEQ PO TBCR
20.0000 meq | EXTENDED_RELEASE_TABLET | Freq: Two times a day (BID) | ORAL | Status: DC
  Administered 2011-09-19 – 2011-09-25 (×12): 20 meq via ORAL
  Filled 2011-09-19 (×17): qty 1

## 2011-09-19 MED ORDER — VANCOMYCIN HCL IN DEXTROSE 1-5 GM/200ML-% IV SOLN
1000.0000 mg | INTRAVENOUS | Status: AC
  Administered 2011-09-20: 1000 mg via INTRAVENOUS
  Filled 2011-09-19: qty 200

## 2011-09-19 MED ORDER — POLYETHYLENE GLYCOL 3350 17 G PO PACK
17.0000 g | PACK | Freq: Every day | ORAL | Status: DC
  Administered 2011-09-19 – 2011-09-27 (×8): 17 g via ORAL
  Filled 2011-09-19 (×11): qty 1

## 2011-09-19 NOTE — Progress Notes (Signed)
Patient ID: Katelyn [name], female   DOB: [birth year], 66 y.o.   MRN: [id number] Met with [name] again this afternoon.  She has decided to proceed with redo left VATS for completion left upper lobectomy.  Plan OR tomorrow

## 2011-09-19 NOTE — Progress Notes (Signed)
2 Days Post-Op Procedure(s) (LRB): VIDEO ASSISTED THORACOSCOPY (VATS)/WEDGE RESECTION (Left) Subjective: Pain reasonably well controlled No BM  Objective: Vital signs in last 24 hours: Temp:  [98.1 F (36.7 C)-99 F (37.2 C)] 98.1 F (36.7 C) (03/21 0828) Pulse Rate:  [82-98] 83  (03/21 0828) Cardiac Rhythm:  [-] Normal sinus rhythm (03/21 0415) Resp:  [13-26] 13  (03/21 0828) BP: (121-161)/(50-62) 126/51 mmHg (03/21 0828) SpO2:  [87 %-100 %] 100 % (03/21 0828)  Hemodynamic parameters for last 24 hours:    Intake/Output from previous day: 03/20 0701 - 03/21 0700 In: 1662.1 [P.O.:720; I.V.:942.1] Out: 1680 [Urine:1350; Chest Tube:330] Intake/Output this shift:    General appearance: alert and no distress Heart: regular rate and rhythm Lungs: diminished breath sounds left side  Lab Results:  Basename 09/19/11 0400 09/18/11 0430  WBC 16.0* 15.2*  HGB 12.0 12.4  HCT 35.5* 37.1  PLT 176 184   BMET:  Basename 09/19/11 0400 09/18/11 0430  NA 126* 136  K 3.7 4.0  CL 91* 103  CO2 28 27  GLUCOSE 113* 137*  BUN 8 13  CREATININE 0.65 0.82  CALCIUM 8.8 8.9    PT/INR: No results found for this basename: LABPROT,INR in the last 72 hours ABG    Component Value Date/Time   PHART 7.398 09/18/2011 0535   HCO3 23.7 09/18/2011 0535   TCO2 25.0 09/18/2011 0535   ACIDBASEDEF 0.5 09/18/2011 0535   O2SAT 88.9 09/18/2011 0535   CBG (last 3)  No results found for this basename: GLUCAP:3 in the last 72 hours  Assessment/Plan: S/P Procedure(s) (LRB): VIDEO ASSISTED THORACOSCOPY (VATS)/WEDGE RESECTION (Left) -Path- invasive squamous cell cancer, 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 Our options at this point are to return to OR tomorrow for completion left upper lobectomy to ensure adequate margins or to follow clinically. They understand that based on microscopic evidence there is no we can be sure all of the tumor has been removed.  Given the margin issue I strongly encouraged surgery. I doubt she would be a candidate for additional XRT, but would consult them to consider it if she does not want to have additional surgery. They understand that there are risks associated with either approach. They are aware of the risks of lobectomy including but not limited to death, MI, DVT, PE, bleeding, possible need for transfusion, infection as well as other unforeseeable complications. She wishes to discuss with her family prior to making a decision. Will tentatively plan for redo left VATS, left upper lobectomy tomorrow.    LOS: 2 days    Sinead Hockman C 09/19/2011

## 2011-09-20 ENCOUNTER — Inpatient Hospital Stay (HOSPITAL_COMMUNITY): Payer: Medicare Other

## 2011-09-20 ENCOUNTER — Encounter (HOSPITAL_COMMUNITY): Payer: Self-pay | Admitting: Anesthesiology

## 2011-09-20 ENCOUNTER — Encounter (HOSPITAL_COMMUNITY)
Admission: RE | Disposition: A | Discharge status: Home Health | Payer: Self-pay | Source: Ambulatory Visit | Attending: Thoracic Surgery (Cardiothoracic Vascular Surgery)

## 2011-09-20 ENCOUNTER — Inpatient Hospital Stay (HOSPITAL_COMMUNITY): Payer: Medicare Other | Admitting: Anesthesiology

## 2011-09-20 DIAGNOSIS — C3410 Malignant neoplasm of upper lobe, unspecified bronchus or lung: Secondary | ICD-10-CM

## 2011-09-20 LAB — POCT I-STAT 7, (LYTES, BLD GAS, ICA,H+H)
Acid-Base Excess: 1 mmol/L (ref 0.0–2.0)
Acid-base deficit: 2 mmol/L (ref 0.0–2.0)
Bicarbonate: 29.8 mEq/L — ABNORMAL HIGH (ref 20.0–24.0)
Bicarbonate: 28.1 mEq/L — ABNORMAL HIGH (ref 20.0–24.0)
Calcium, Ion: 1.25 mmol/L (ref 1.12–1.32)
HCT: 32 % — ABNORMAL LOW (ref 36.0–46.0)
HCT: 26 % — ABNORMAL LOW (ref 36.0–46.0)
HCT: 35 % — ABNORMAL LOW (ref 36.0–46.0)
Hemoglobin: 10.9 g/dL — ABNORMAL LOW (ref 12.0–15.0)
Hemoglobin: 8.8 g/dL — ABNORMAL LOW (ref 12.0–15.0)
Hemoglobin: 11.9 g/dL — ABNORMAL LOW (ref 12.0–15.0)
O2 Saturation: 92 %
Potassium: 2.9 mEq/L — ABNORMAL LOW (ref 3.5–5.1)
Potassium: 4.1 mEq/L (ref 3.5–5.1)
Sodium: 138 mEq/L (ref 135–145)
Sodium: 134 mEq/L — ABNORMAL LOW (ref 135–145)
TCO2: 30 mmol/L (ref 0–100)
TCO2: 29 mmol/L (ref 0–100)
pCO2 arterial: 49.5 mmHg — ABNORMAL HIGH (ref 35.0–45.0)
pH, Arterial: 7.385 (ref 7.350–7.400)
pH, Arterial: 7.372 (ref 7.350–7.400)
pH, Arterial: 7.383 (ref 7.350–7.400)
pO2, Arterial: 97 mmHg (ref 80.0–100.0)
pO2, Arterial: 69 mmHg — ABNORMAL LOW (ref 80.0–100.0)
pO2, Arterial: 77 mmHg — ABNORMAL LOW (ref 80.0–100.0)

## 2011-09-20 LAB — GLUCOSE, CAPILLARY

## 2011-09-20 SURGERY — VIDEO ASSISTED THORACOSCOPY (VATS)/ LOBECTOMY
Anesthesia: General | Site: Chest | Laterality: Left | Wound class: Clean Contaminated

## 2011-09-20 MED ORDER — SODIUM CHLORIDE 0.9 % IV SOLN
10.0000 mg | INTRAVENOUS | Status: DC | PRN
  Administered 2011-09-20: 10 ug/min via INTRAVENOUS

## 2011-09-20 MED ORDER — HYDROMORPHONE HCL PF 1 MG/ML IJ SOLN: 0.2500 mg | INTRAMUSCULAR | Status: DC | PRN

## 2011-09-20 MED ORDER — NEOSTIGMINE METHYLSULFATE 1 MG/ML IJ SOLN
INTRAMUSCULAR | Status: DC | PRN
  Administered 2011-09-20: 2 mg via INTRAVENOUS

## 2011-09-20 MED ORDER — SENNOSIDES-DOCUSATE SODIUM 8.6-50 MG PO TABS
1.0000 | ORAL_TABLET | Freq: Every evening | ORAL | Status: DC | PRN
  Filled 2011-09-20: qty 1

## 2011-09-20 MED ORDER — SODIUM CHLORIDE 0.9 % IR SOLN
Status: DC | PRN
  Administered 2011-09-20: 2000 mL

## 2011-09-20 MED ORDER — LACTATED RINGERS IV SOLN
INTRAVENOUS | Status: DC | PRN
  Administered 2011-09-20: 14:00:00 via INTRAVENOUS

## 2011-09-20 MED ORDER — EPINEPHRINE HCL 0.1 MG/ML IJ SOLN
INTRAMUSCULAR | Status: DC | PRN
  Administered 2011-09-20: 200 ug via INTRAVENOUS

## 2011-09-20 MED ORDER — EPHEDRINE SULFATE 50 MG/ML IJ SOLN
INTRAMUSCULAR | Status: DC | PRN
  Administered 2011-09-20: 10 mg via INTRAVENOUS

## 2011-09-20 MED ORDER — KCL IN DEXTROSE-NACL 20-5-0.45 MEQ/L-%-% IV SOLN
INTRAVENOUS | Status: AC
  Administered 2011-09-20: 1000 mL
  Filled 2011-09-20: qty 1000

## 2011-09-20 MED ORDER — OXYCODONE HCL 5 MG PO TABS
5.0000 mg | ORAL_TABLET | ORAL | Status: AC | PRN
  Administered 2011-09-21: 5 mg via ORAL
  Filled 2011-09-20: qty 1

## 2011-09-20 MED ORDER — ACETAMINOPHEN 10 MG/ML IV SOLN
1000.0000 mg | Freq: Four times a day (QID) | INTRAVENOUS | Status: AC
  Administered 2011-09-20 – 2011-09-21 (×4): 1000 mg via INTRAVENOUS
  Filled 2011-09-20 (×4): qty 100

## 2011-09-20 MED ORDER — LABETALOL HCL 5 MG/ML IV SOLN
INTRAVENOUS | Status: DC | PRN
  Administered 2011-09-20: 10 mg via INTRAVENOUS

## 2011-09-20 MED ORDER — BISACODYL 5 MG PO TBEC
10.0000 mg | DELAYED_RELEASE_TABLET | Freq: Every day | ORAL | Status: DC
  Administered 2011-09-21 – 2011-09-27 (×7): 10 mg via ORAL
  Filled 2011-09-20 (×5): qty 2
  Filled 2011-09-20: qty 1
  Filled 2011-09-20: qty 2

## 2011-09-20 MED ORDER — PROPOFOL 10 MG/ML IV EMUL
INTRAVENOUS | Status: DC | PRN
  Administered 2011-09-20: 150 mg via INTRAVENOUS

## 2011-09-20 MED ORDER — ONDANSETRON HCL 4 MG/2ML IJ SOLN: 4.0000 mg | Freq: Once | INTRAMUSCULAR | Status: DC | PRN

## 2011-09-20 MED ORDER — FENTANYL CITRATE 0.05 MG/ML IJ SOLN
INTRAMUSCULAR | Status: DC | PRN
  Administered 2011-09-20: 100 ug via INTRAVENOUS
  Administered 2011-09-20 (×2): 50 ug via INTRAVENOUS
  Administered 2011-09-20: 25 ug
  Administered 2011-09-20: 100 ug via INTRAVENOUS
  Administered 2011-09-20: 25 ug
  Administered 2011-09-20: 50 ug via INTRAVENOUS
  Administered 2011-09-20: 25 ug

## 2011-09-20 MED ORDER — OXYCODONE-ACETAMINOPHEN 5-325 MG PO TABS
1.0000 | ORAL_TABLET | ORAL | Status: DC | PRN
  Administered 2011-09-23 (×2): 1 via ORAL
  Filled 2011-09-20: qty 2

## 2011-09-20 MED ORDER — ALBUTEROL SULFATE (5 MG/ML) 0.5% IN NEBU
2.5000 mg | INHALATION_SOLUTION | RESPIRATORY_TRACT | Status: DC | PRN
  Administered 2011-09-21: 2.5 mg via RESPIRATORY_TRACT
  Filled 2011-09-20: qty 0.5

## 2011-09-20 MED ORDER — POTASSIUM CHLORIDE 10 MEQ/100ML IV SOLN
10.0000 meq | INTRAVENOUS | Status: AC
  Filled 2011-09-20 (×2): qty 100

## 2011-09-20 MED ORDER — OXYCODONE-ACETAMINOPHEN 5-325 MG PO TABS
1.0000 | ORAL_TABLET | ORAL | Status: DC | PRN
  Filled 2011-09-20: qty 1

## 2011-09-20 MED ORDER — POTASSIUM CHLORIDE 10 MEQ/50ML IV SOLN
10.0000 meq | Freq: Every day | INTRAVENOUS | Status: DC | PRN
  Administered 2011-09-22 (×3): 10 meq via INTRAVENOUS
  Filled 2011-09-20 (×4): qty 50

## 2011-09-20 MED ORDER — ROCURONIUM BROMIDE 100 MG/10ML IV SOLN
INTRAVENOUS | Status: DC | PRN
  Administered 2011-09-20: 50 mg via INTRAVENOUS
  Administered 2011-09-20: 20 mg via INTRAVENOUS
  Administered 2011-09-20: 30 mg via INTRAVENOUS

## 2011-09-20 MED ORDER — VECURONIUM BROMIDE 10 MG IV SOLR
INTRAVENOUS | Status: DC | PRN
  Administered 2011-09-20 (×2): 2 mg via INTRAVENOUS

## 2011-09-20 MED ORDER — MIDAZOLAM HCL 5 MG/5ML IJ SOLN
INTRAMUSCULAR | Status: DC | PRN
  Administered 2011-09-20: 1 mg via INTRAVENOUS

## 2011-09-20 MED ORDER — GLYCOPYRROLATE 0.2 MG/ML IJ SOLN
INTRAMUSCULAR | Status: DC | PRN
  Administered 2011-09-20: 0.4 mg via INTRAVENOUS

## 2011-09-20 MED ORDER — VANCOMYCIN HCL IN DEXTROSE 1-5 GM/200ML-% IV SOLN
1000.0000 mg | Freq: Two times a day (BID) | INTRAVENOUS | Status: AC
  Administered 2011-09-21: 1000 mg via INTRAVENOUS
  Filled 2011-09-20: qty 200

## 2011-09-20 MED ORDER — NITROGLYCERIN IN D5W 200-5 MCG/ML-% IV SOLN
INTRAVENOUS | Status: DC | PRN
  Administered 2011-09-20: 40 ug/min via INTRAVENOUS

## 2011-09-20 MED ORDER — INSULIN ASPART 100 UNIT/ML ~~LOC~~ SOLN
0.0000 [IU] | SUBCUTANEOUS | Status: DC
  Administered 2011-09-21 – 2011-09-22 (×5): 2 [IU] via SUBCUTANEOUS

## 2011-09-20 MED ORDER — FENTANYL CITRATE 0.05 MG/ML IJ SOLN
25.0000 ug | INTRAMUSCULAR | Status: DC | PRN
  Administered 2011-09-20: 25 ug via INTRAVENOUS

## 2011-09-20 MED ORDER — IPRATROPIUM BROMIDE 0.02 % IN SOLN
0.5000 mg | RESPIRATORY_TRACT | Status: DC | PRN
  Administered 2011-09-21: 0.5 mg via RESPIRATORY_TRACT
  Filled 2011-09-20: qty 2.5

## 2011-09-20 MED ORDER — LACTATED RINGERS IV SOLN
INTRAVENOUS | Status: DC | PRN
  Administered 2011-09-20 (×3): via INTRAVENOUS

## 2011-09-20 MED ORDER — KCL IN DEXTROSE-NACL 20-5-0.9 MEQ/L-%-% IV SOLN
INTRAVENOUS | Status: DC
  Administered 2011-09-20 – 2011-09-25 (×3): via INTRAVENOUS
  Filled 2011-09-20 (×6): qty 1000

## 2011-09-20 MED ORDER — HEMOSTATIC AGENTS (NO CHARGE) OPTIME
TOPICAL | Status: DC | PRN
  Administered 2011-09-20: 1

## 2011-09-20 MED ORDER — FENTANYL CITRATE 0.05 MG/ML IJ SOLN
25.0000 ug | INTRAMUSCULAR | Status: DC | PRN
  Administered 2011-09-20: 50 ug via INTRAVENOUS
  Filled 2011-09-20: qty 2

## 2011-09-20 MED ORDER — ONDANSETRON HCL 4 MG/2ML IJ SOLN: 4.0000 mg | Freq: Four times a day (QID) | INTRAMUSCULAR | Status: DC | PRN

## 2011-09-20 SURGICAL SUPPLY — 96 items
ADPR TBG 2 MALE LL ART (MISCELLANEOUS) ×1
APPLIER CLIP 9.375 SM OPEN (CLIP) ×2
APPLIER CLIP ROT 10 11.4 M/L (STAPLE)
APR CLP MED LRG 11.4X10 (STAPLE)
APR CLP SM 9.3 20 MLT OPN (CLIP) ×1
CANISTER SUCTION 2500CC (MISCELLANEOUS) ×4 IMPLANT
CATH KIT ON Q 5IN SLV (PAIN MANAGEMENT) IMPLANT
CATH THORACIC 28FR (CATHETERS) ×1 IMPLANT
CATH THORACIC 28FR RT ANG (CATHETERS) ×2 IMPLANT
CATH THORACIC 36FR (CATHETERS) ×1 IMPLANT
CATH THORACIC 36FR RT ANG (CATHETERS) ×1 IMPLANT
CLEANER TIP ELECTROSURG 2X2 (MISCELLANEOUS) ×1 IMPLANT
CLIP APPLIE 9.375 SM OPEN (CLIP) ×1 IMPLANT
CLIP APPLIE ROT 10 11.4 M/L (STAPLE) IMPLANT
CLIP TI MEDIUM 24 (CLIP) ×2 IMPLANT
CLIP TI MEDIUM 6 (CLIP) ×2 IMPLANT
CLOTH BEACON ORANGE TIMEOUT ST (SAFETY) ×2 IMPLANT
CONN Y 3/8X3/8X3/8  BEN (MISCELLANEOUS) ×1
CONN Y 3/8X3/8X3/8 BEN (MISCELLANEOUS) ×1 IMPLANT
CONT SPEC 4OZ CLIKSEAL STRL BL (MISCELLANEOUS) ×4 IMPLANT
DRAPE LAPAROSCOPIC ABDOMINAL (DRAPES) ×2 IMPLANT
DRAPE SLUSH MACHINE 52X66 (DRAPES) IMPLANT
DRAPE SLUSH/WARMER DISC (DRAPES) IMPLANT
ELECT REM PT RETURN 9FT ADLT (ELECTROSURGICAL) ×2
ELECTRODE REM PT RTRN 9FT ADLT (ELECTROSURGICAL) ×1 IMPLANT
GLOVE BIO SURGEON STRL SZ 6 (GLOVE) ×3 IMPLANT
GLOVE BIOGEL PI IND STRL 6 (GLOVE) IMPLANT
GLOVE BIOGEL PI IND STRL 6.5 (GLOVE) IMPLANT
GLOVE BIOGEL PI IND STRL 7.0 (GLOVE) IMPLANT
GLOVE BIOGEL PI INDICATOR 6 (GLOVE) ×1
GLOVE BIOGEL PI INDICATOR 6.5 (GLOVE) ×1
GLOVE BIOGEL PI INDICATOR 7.0 (GLOVE) ×2
GLOVE EUDERMIC 7 POWDERFREE (GLOVE) ×4 IMPLANT
GLOVE SKINSENSE NS SZ6.5 (GLOVE) ×1
GLOVE SKINSENSE STRL SZ6.5 (GLOVE) IMPLANT
GOWN PREVENTION PLUS XLARGE (GOWN DISPOSABLE) ×2 IMPLANT
GOWN STRL NON-REIN LRG LVL3 (GOWN DISPOSABLE) ×5 IMPLANT
HANDLE STAPLE ENDO GIA SHORT (STAPLE) ×1
HEMOSTAT SURGICEL 2X14 (HEMOSTASIS) IMPLANT
IV ADAPTER SYR DOUBLE MALE LL (MISCELLANEOUS) ×1 IMPLANT
KIT BASIN OR (CUSTOM PROCEDURE TRAY) ×2 IMPLANT
KIT ROOM TURNOVER OR (KITS) ×2 IMPLANT
KIT SUCTION CATH 14FR (SUCTIONS) ×2 IMPLANT
NS IRRIG 1000ML POUR BTL (IV SOLUTION) ×4 IMPLANT
PACK CHEST (CUSTOM PROCEDURE TRAY) ×2 IMPLANT
PAD ARMBOARD 7.5X6 YLW CONV (MISCELLANEOUS) ×4 IMPLANT
PAD DEFIB STAT PADZ MULTI (MISCELLANEOUS) ×4 IMPLANT
POUCH ENDO CATCH II 15MM (MISCELLANEOUS) IMPLANT
RELOAD EGIA 45 MED/THCK PURPLE (STAPLE) ×1 IMPLANT
RELOAD EGIA 45 TAN VASC (STAPLE) ×1 IMPLANT
RELOAD EGIA 60 MED/THCK PURPLE (STAPLE) ×14 IMPLANT
RELOAD EGIA TRIS TAN 45 CVD (STAPLE) ×4 IMPLANT
RELOAD STAPLE 45 TAN MED CVD (STAPLE) IMPLANT
RELOAD STAPLE 60 MED/THCK ART (STAPLE) IMPLANT
SEALANT PROGEL (MISCELLANEOUS) ×1 IMPLANT
SEALANT SURG COSEAL 4ML (VASCULAR PRODUCTS) IMPLANT
SEALANT SURG COSEAL 8ML (VASCULAR PRODUCTS) IMPLANT
SOLUTION ANTI FOG 6CC (MISCELLANEOUS) ×4 IMPLANT
SPECIMEN JAR MEDIUM (MISCELLANEOUS) ×2 IMPLANT
SPONGE GAUZE 4X4 12PLY (GAUZE/BANDAGES/DRESSINGS) ×2 IMPLANT
SPONGE INTESTINAL PEANUT (DISPOSABLE) IMPLANT
STAPLER ENDO GIA 12 SHRT THIN (STAPLE) IMPLANT
STAPLER ENDO GIA 12MM SHORT (STAPLE) ×1 IMPLANT
STAPLER PROXIMATE 75MM BLUE (STAPLE) ×1 IMPLANT
STAPLER PROXIMATE BLUE 30MML (STAPLE) ×1 IMPLANT
STAPLER VISISTAT 35W (STAPLE) ×1 IMPLANT
STOPCOCK 4 WAY LG BORE MALE ST (IV SETS) ×1 IMPLANT
SUT PROLENE 4 0 RB 1 (SUTURE)
SUT PROLENE 4-0 RB1 .5 CRCL 36 (SUTURE) IMPLANT
SUT SILK  1 MH (SUTURE) ×2
SUT SILK 1 MH (SUTURE) ×2 IMPLANT
SUT SILK 2 0SH CR/8 30 (SUTURE) ×1 IMPLANT
SUT SILK 3 0SH CR/8 30 (SUTURE) ×1 IMPLANT
SUT VIC AB 1 CTX 36 (SUTURE) ×6
SUT VIC AB 1 CTX36XBRD ANBCTR (SUTURE) IMPLANT
SUT VIC AB 2-0 CTX 36 (SUTURE) IMPLANT
SUT VIC AB 2-0 UR6 27 (SUTURE) IMPLANT
SUT VIC AB 3-0 MH 27 (SUTURE) IMPLANT
SUT VIC AB 3-0 SH 27 (SUTURE) ×2
SUT VIC AB 3-0 SH 27X BRD (SUTURE) IMPLANT
SUT VIC AB 3-0 X1 27 (SUTURE) ×3 IMPLANT
SUT VICRYL 2 TP 1 (SUTURE) ×1 IMPLANT
SWAB COLLECTION DEVICE MRSA (MISCELLANEOUS) IMPLANT
SYSTEM SAHARA CHEST DRAIN ATS (WOUND CARE) ×2 IMPLANT
TAPE CLOTH 4X10 WHT NS (GAUZE/BANDAGES/DRESSINGS) ×2 IMPLANT
TAPE CLOTH SURG 4X10 WHT LF (GAUZE/BANDAGES/DRESSINGS) ×1 IMPLANT
TIP APPLICATOR SPRAY EXTEND 16 (VASCULAR PRODUCTS) IMPLANT
TOWEL OR 17X24 6PK STRL BLUE (TOWEL DISPOSABLE) ×2 IMPLANT
TOWEL OR 17X26 10 PK STRL BLUE (TOWEL DISPOSABLE) ×2 IMPLANT
TRAP SPECIMEN MUCOUS 40CC (MISCELLANEOUS) IMPLANT
TRAY CATH LUMEN 1 20CM STRL (SET/KITS/TRAYS/PACK) ×1 IMPLANT
TRAY FOLEY CATH 14FRSI W/METER (CATHETERS) ×2 IMPLANT
TUBE ANAEROBIC SPECIMEN COL (MISCELLANEOUS) IMPLANT
TUBING ART PRESS 48 MALE/FEM (TUBING) ×2 IMPLANT
TUNNELER SHEATH ON-Q 11GX8 (MISCELLANEOUS) ×2 IMPLANT
WATER STERILE IRR 1000ML POUR (IV SOLUTION) ×4 IMPLANT

## 2011-09-20 NOTE — Progress Notes (Signed)
Transported pt to pre op via stretcher with rn and monitor. Family follow pt to 2nd floor waiting room. Beryle Quant

## 2011-09-20 NOTE — Anesthesia Procedure Notes (Signed)
Procedure Name: Intubation Date/Time: 09/20/2011 3:29 PM Performed by: Jerilee Hoh Pre-anesthesia Checklist: Patient identified, Emergency Drugs available, Suction available and Patient being monitored Patient Re-evaluated:Patient Re-evaluated prior to inductionOxygen Delivery Method: Circle system utilized Preoxygenation: Pre-oxygenation with 100% oxygen Intubation Type: IV induction Ventilation: Mask ventilation without difficulty Grade View: Grade II Endobronchial tube: Left and 37 Fr Number of attempts: 1 Airway Equipment and Method: Stylet and Video-laryngoscopy Placement Confirmation: ETT inserted through vocal cords under direct vision,  positive ETCO2 and breath sounds checked- equal and bilateral Tube secured with: Tape Dental Injury: Teeth and Oropharynx as per pre-operative assessment  Comments: Pt had loose tooth prior to induction.  Tooth safely removed after induction, but before DL.  Glidescope used d/t previous difficulty with intubation.  Grade II view with Glide.  Left EBT placed, but unable to position correctly.  See Quick Notes for times.  Eventually, EBT was exchanged for a 8.0 ETT, and a bronchial blocker was successfully placed using fiberoptic scope.  Right lung isolated.  O2 saturation remained above 95% during entire airway intervention.

## 2011-09-20 NOTE — Interval H&P Note (Signed)
History and Physical Interval Note:  09/20/2011 1:42 PM  Katelyn [name]  has presented today for surgery, with the diagnosis of lung cancer  The various methods of treatment have been discussed with the patient and family. After consideration of risks, benefits and other options for treatment, the patient has consented to  Procedure(s) (LRB): VIDEO ASSISTED THORACOSCOPY (VATS)/ LOBECTOMY (Left) as a surgical intervention .  The patients' history has been reviewed, patient examined, no change in status, stable for surgery.  I have reviewed the patients' chart and labs.  Questions were answered to the patient's satisfaction.     [name] C  See progress notes for details. 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 Margins microscopically positive. Needs completion left upper lobectomy.

## 2011-09-20 NOTE — Transfer of Care (Signed)
Immediate Anesthesia Transfer of Care Note  Patient: Katelyn [name]  Procedure(s) Performed: Procedure(s) (LRB): VIDEO ASSISTED THORACOSCOPY (VATS)/ LOBECTOMY (Left)  Patient Location: PACU  Anesthesia Type: General  Level of Consciousness: sedated and responds to stimulation  Airway & Oxygen Therapy: Patient Spontanous Breathing and Patient connected to face mask oxygen  Post-op Assessment: Report given to PACU RN and Post -op Vital signs reviewed and stable  Post vital signs: Reviewed and stable  Complications: No apparent anesthesia complications

## 2011-09-20 NOTE — Brief Op Note (Signed)
09/17/2011 - 09/20/2011  7:44 PM  PATIENT:  Katelyn [name]  66 y.o. female  PRE-OPERATIVE DIAGNOSIS:  lung cancer  POST-OPERATIVE DIAGNOSIS:  lung cancer  PROCEDURE:  Procedure(s) (LRB): Left Thoracotomy, Completion Left Upper Lobectomy  SURGEON:  Charlett Lango, MD    * Loreli Slot, MD - Primary  PHYSICIAN ASSISTANT: none  ASSISTANTS: RNFA   ANESTHESIA:   general  EBL:  Total I/O In: 1000 [I.V.:1000] Out: 250 [Urine:100; Blood:150]  BLOOD ADMINISTERED:none  DRAINS: 2 Chest Tube(s) in the left pleura   SPECIMEN:  Source of Specimen:  reaminder left upper lobe  DISPOSITION OF SPECIMEN:  PATHOLOGY  COUNTS:  YES  PLAN OF CARE: Admit to inpatient   PATIENT DISPOSITION:  PACU - hemodynamically stable.   Delay start of Pharmacological VTE agent (>24hrs) due to surgical blood loss or risk of bleeding: yes

## 2011-09-20 NOTE — Preoperative (Signed)
Beta Blockers   Reason not to administer Beta Blockers:Not Applicable 

## 2011-09-20 NOTE — Anesthesia Preprocedure Evaluation (Addendum)
Anesthesia Evaluation  Patient identified by MRN, date of birth, ID band Patient awake    Reviewed: Allergy & Precautions, H&P , NPO status , Patient's Chart, lab work & pertinent test results  Airway Mallampati: II TM Distance: >3 FB Neck ROM: Full    Dental  (+) Poor Dentition, Loose and Dental Advisory Given   Pulmonary  breath sounds clear to auscultation        Cardiovascular Rhythm:Regular Rate:Normal     Neuro/Psych    GI/Hepatic   Endo/Other    Renal/GU      Musculoskeletal   Abdominal   Peds  Hematology   Anesthesia Other Findings   Reproductive/Obstetrics                           Anesthesia Physical Anesthesia Plan  ASA: III  Anesthesia Plan: General   Post-op Pain Management:    Induction: Intravenous  Airway Management Planned: Double Lumen EBT  Additional Equipment: Arterial line  Intra-op Plan:   Post-operative Plan: Extubation in OR  Informed Consent: I have reviewed the patients History and Physical, chart, labs and discussed the procedure including the risks, benefits and alternatives for the proposed anesthesia with the patient or authorized representative who has indicated his/her understanding and acceptance.   Dental advisory given  Plan Discussed with: CRNA, Anesthesiologist and Surgeon  Anesthesia Plan Comments:        Anesthesia Quick Evaluation

## 2011-09-20 NOTE — Progress Notes (Signed)
Wasted 6ml of fentanyl pca. Witnesses dawn fields rn. Beryle Quant

## 2011-09-21 ENCOUNTER — Inpatient Hospital Stay (HOSPITAL_COMMUNITY): Payer: Medicare Other

## 2011-09-21 HISTORY — PX: LUNG CANCER SURGERY: SHX702

## 2011-09-21 LAB — BASIC METABOLIC PANEL
BUN: 10 mg/dL (ref 6–23)
CO2: 26 mEq/L (ref 19–32)
Chloride: 99 mEq/L (ref 96–112)
Creatinine, Ser: 0.68 mg/dL (ref 0.50–1.10)
GFR calc Af Amer: >90 mL/min (ref >90)
Glucose, Bld: 122 mg/dL — ABNORMAL HIGH (ref 70–99)
Potassium: 4.2 mEq/L (ref 3.5–5.1)

## 2011-09-21 LAB — GLUCOSE, CAPILLARY
Glucose-Capillary: 109 mg/dL — ABNORMAL HIGH (ref 70–99)
Glucose-Capillary: 147 mg/dL — ABNORMAL HIGH (ref 70–99)
Glucose-Capillary: 118 mg/dL — ABNORMAL HIGH (ref 70–99)
Glucose-Capillary: 105 mg/dL — ABNORMAL HIGH (ref 70–99)
Glucose-Capillary: 112 mg/dL — ABNORMAL HIGH (ref 70–99)
Glucose-Capillary: 130 mg/dL — ABNORMAL HIGH (ref 70–99)

## 2011-09-21 LAB — POCT I-STAT 3, ART BLOOD GAS (G3+)
Acid-Base Excess: 1 mmol/L (ref 0.0–2.0)
Bicarbonate: 26.1 mEq/L — ABNORMAL HIGH (ref 20.0–24.0)
pCO2 arterial: 41.2 mmHg (ref 35.0–45.0)
pH, Arterial: 7.41 — ABNORMAL HIGH (ref 7.350–7.400)
pO2, Arterial: 58 mmHg — ABNORMAL LOW (ref 80.0–100.0)

## 2011-09-21 LAB — CBC
HCT: 33.5 % — ABNORMAL LOW (ref 36.0–46.0)
Hemoglobin: 11.2 g/dL — ABNORMAL LOW (ref 12.0–15.0)
MCV: 95.4 fL (ref 78.0–100.0)
WBC: 18.7 10*3/uL — ABNORMAL HIGH (ref 4.0–10.5)

## 2011-09-21 NOTE — Progress Notes (Signed)
   CARDIOTHORACIC SURGERY PROGRESS NOTE   R1 Day Post-Op Procedure(s) (LRB): VIDEO ASSISTED THORACOSCOPY (VATS)/ LOBECTOMY (Left)  Subjective: Looks good.  Mild soreness in chest.  Productive cough  Objective: Vital signs: BP Readings from Last 1 Encounters:  09/21/11 109/45   Pulse Readings from Last 1 Encounters:  09/21/11 86   Resp Readings from Last 1 Encounters:  09/21/11 13   Temp Readings from Last 1 Encounters:  09/21/11 97.9 F (36.6 C) Oral    Hemodynamics:    Physical Exam:  Rhythm:   sinus  Breath sounds: Coarse rhonchi L>R  Heart sounds:  RRR  Incisions:  Dressings dry  Abdomen:  abdomen  Extremities:  Warm, well perfused  Chest tubes:  + small air leak   Intake/Output from previous day: 03/22 0701 - 03/23 0700 In: 3957.4 [I.V.:3557.4; IV Piggyback:400] Out: 1660 [Urine:1070; Blood:150; Chest Tube:440] Intake/Output this shift: Total I/O In: 652 [P.O.:250; I.V.:302; IV Piggyback:100] Out: 310 [Urine:160; Chest Tube:150]  Lab Results:  Basename 09/21/11 0440 09/20/11 2124 09/19/11 1900  WBC 18.7* -- 15.5*  HGB 11.2* 11.9* --  HCT 33.5* 35.0* --  PLT 206 -- 189   BMET:  Basename 09/21/11 0440 09/20/11 2124 09/19/11 1900  NA 132* 134* --  K 4.2 4.1 --  CL 99 -- 92*  CO2 26 -- 27  GLUCOSE 122* -- 126*  BUN 10 -- 9  CREATININE 0.68 -- 0.68  CALCIUM 8.6 -- 9.0    CBG (last 3)   Basename 09/21/11 1136 09/21/11 0716 09/21/11 0431  GLUCAP 118* 147* 109*   ABG    Component Value Date/Time   PHART 7.410* 09/21/2011 0432   HCO3 26.1* 09/21/2011 0432   TCO2 27 09/21/2011 0432   ACIDBASEDEF 2.0 09/20/2011 1922   O2SAT 90.0 09/21/2011 0432   CXR: Stable opacity Left lung with stable residual space (PTX)  Assessment/Plan: S/P Procedure(s) (LRB): VIDEO ASSISTED THORACOSCOPY (VATS)/ LOBECTOMY (Left)  Doing well POD1 completion lobectomy D/C femoral Aline and mobilize Pulm toilet   Adreanne Yono H 09/21/2011 12:21 PM

## 2011-09-21 NOTE — Op Note (Signed)
NAMEAJAYA, CRUTCHFIELD NO.:  [id number]  MEDICAL RECORD NO.:  [id number]  LOCATION:  2316                         FACILITY:  [facility]  PHYSICIAN:  Salvatore Decent. 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  PROCEDURE:  Left thoracotomy, completion left upper lobectomy.  SURGEON:  Salvatore Decent. Dorris Fetch, MD  ANESTHESIA:  General.  FINDINGS:  Final margin negative for tumor.  No definite residual mass noted.  CLINICAL INDICATION:  Ms. 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 She recently underwent wedge resection x2 for hypometabolic left upper lobe nodules, it was unclear due to extensive radiation change on frozen section, but on final pathology, it was determined that she did have invasive squamous cell carcinoma.  Unfortunately, the margin of the posterior wedge was involved.  The anterior wedge had a clear margin, but given the inability to give significant radiation therapy to the area, it was recommended that she undergo a completion left upper lobectomy.  The indications, risks, benefits, and alternatives were discussed in detail with the patient.  She understood and accepted the risks and agreed to proceed.  OPERATIVE NOTE:  Ms. Komar was brought to the preop holding area on September 20, 2011, there Anesthesia attempted to place an arterial blood pressure monitoring line.  She ultimately was taken to the operating room, anesthetized, and intubated with a double-lumen endotracheal tube. The left radial A line ultimately was placed, but was poorly functional. Prolonged  attempts to achieve adequate ventilation and lung separation with a double-lumen endotracheal tube were unsuccessful.  Unfortunately, this patient became very difficult to ventilate, at that time, there was no definite palpable pulse and CPR was initiated.  Low-dose epinephrine was given and there was return of a palpable pulse with less than 1 minute.  Ultimately after multiple failed attempts, it was decided to attempt re-intubation with a single-lumen tube and use a bronchial blocker, this likewise is a considerable period of time, but ultimately was successful.  It should be noted that the time when the patient had some hemodynamic instability, right femoral arterial line was placed, which was a reliable source for continuous blood pressure monitoring.  PAS hose were in place for DVT prophylaxis and the patient was kept under a Bair Hugger during the anesthetic phase of the procedure.  At this point with the patient hemodynamically stable with good gas exchange, it was felt safe to proceed with the planned procedure.  She was placed in the right lateral decubitus position.  The balloon was inflated in the left main bronchus.  The left chest was prepped and draped in usual sterile fashion.  The previous two port-type incisions as well as the minithoracotomy were opened.  A port was placed through one of the small incisions and the thoracoscope was placed.  It was clear that this was not going  to be a pure thoracoscopic procedure at this point and the thoracotomy wound was lengthened and a Tuffier retractor was placed. Majority of the dissection was done directly visualizing the structures rather than with thoracoscopic visualization.  The thoracoscope did provide some exposure to difficult areas as well as improved lighting.  The hilum was inspected.  The superior pulmonary vein was dissected out and divided with an Endo-GIA stapler.  Next, the pleural reflection was divided overlying  the pulmonary artery and the upper lobe pulmonary arterial branches were again divided with endoscopic GIA vascular stapler.  Dissection was carried more posteriorly, a third posterior segmental branch was identified, this was much smaller than the other two and was divided after placing three clips on the pulmonary arterial side and one on the specimen side.  Next, the left upper lobe bronchus was dissected out.  A TA-30 stapler was placed across the bronchus, tightened and fired.  The bronchus was divided.  All that remained was to divide the remainder of the major fissure, which was Incomplete. This was done with sequential firings of first a GIA stapler followed by Endo-GIA stapler.  First, completing the specimen, it could be seen at that point that the stapler had gone tangentially and there was remnant of the left upper lobe remaining, this was removed with additional firing of the Endo-GIA stapler.  Both segments were sent as remainder of left upper lobe.  Progel was applied to the staple lines and the bronchial stump.  A 36-French posterior chest tube and a 28-French anterior chest tube were placed through the original two-port incisions. The thoracotomy wound was closed with #2 Vicryl pericostal sutures followed by #1 Vicryl sutures, closed the muscular layers with 2-0 Vicryl subcutaneous suture, the skin was closed with staples.  It should be noted that the left lung was visualized when the left lower lobe was reinflated and there was good aeration of the left lower lobe.  The patient will be extubated in the operating room and taken to the postanesthetic care unit.     Salvatore Decent Dorris Fetch, M.D.     SCH/MEDQ  D:  09/20/2011  T:  09/21/2011  Job:  [id number]

## 2011-09-22 ENCOUNTER — Inpatient Hospital Stay (HOSPITAL_COMMUNITY): Payer: Medicare Other

## 2011-09-22 LAB — COMPREHENSIVE METABOLIC PANEL
ALT: 15 U/L (ref 0–35)
Albumin: 2.1 g/dL — ABNORMAL LOW (ref 3.5–5.2)
BUN: 8 mg/dL (ref 6–23)
Calcium: 8.7 mg/dL (ref 8.4–10.5)
GFR calc Af Amer: >90 mL/min (ref >90)
Glucose, Bld: 100 mg/dL — ABNORMAL HIGH (ref 70–99)
Sodium: 130 mEq/L — ABNORMAL LOW (ref 135–145)
Total Protein: 5.3 g/dL — ABNORMAL LOW (ref 6.0–8.3)

## 2011-09-22 LAB — CBC
Hemoglobin: 10.7 g/dL — ABNORMAL LOW (ref 12.0–15.0)
MCH: 31.9 pg (ref 26.0–34.0)
MCHC: 33.3 g/dL (ref 30.0–36.0)
RDW: 14 % (ref 11.5–15.5)

## 2011-09-22 LAB — GLUCOSE, CAPILLARY

## 2011-09-22 MED ORDER — POTASSIUM CHLORIDE 10 MEQ/50ML IV SOLN: 10.0000 meq | INTRAVENOUS | Status: DC

## 2011-09-22 MED ORDER — GUAIFENESIN ER 600 MG PO TB12
600.0000 mg | ORAL_TABLET | Freq: Two times a day (BID) | ORAL | Status: DC
  Administered 2011-09-22 – 2011-09-29 (×14): 600 mg via ORAL
  Filled 2011-09-22 (×15): qty 1

## 2011-09-22 NOTE — Progress Notes (Signed)
[facility] BRIEF SICU PROGRESS NOTE  2 Days Post-Op  S/P Procedure(s) (LRB): VIDEO ASSISTED THORACOSCOPY (VATS)/ LOBECTOMY (Left)   Stable day  Plan: Continue current plan  Karolyne Timmons H 09/22/2011 7:41 PM

## 2011-09-22 NOTE — Progress Notes (Signed)
   CARDIOTHORACIC SURGERY PROGRESS NOTE  2 Days Post-Op  S/P Procedure(s) (LRB): VIDEO ASSISTED THORACOSCOPY (VATS)/ LOBECTOMY (Left)  Subjective: Looks good.  Very mild soreness, good analgesia.  Eating breakfast  Objective: Vital signs in last 24 hours: Temp:  [97.9 F (36.6 C)-98.9 F (37.2 C)] 97.9 F (36.6 C) (03/24 0800) Pulse Rate:  [84-117] 97  (03/24 0800) Cardiac Rhythm:  [-] Sinus tachycardia (03/24 0800) Resp:  [10-33] 21  (03/24 0800) BP: (102-179)/(38-70) 135/56 mmHg (03/24 0800) SpO2:  [92 %-98 %] 97 % (03/24 0800) Arterial Line BP: (95-173)/(34-69) 173/69 mmHg (03/23 1300) FiO2 (%):  [4 %] 4 % (03/23 2000) Weight:  [81.2 kg (179 lb 0.2 oz)] 81.2 kg (179 lb 0.2 oz) (03/24 0600)  Physical Exam:  Rhythm:   sinus  Breath sounds: Fairly clear  Heart sounds:  RRR  Incisions:  Dressings dry  Abdomen:  soft  Extremities:  warm  Chest tube(s):  + small air leak, output low   Intake/Output from previous day: 03/23 0701 - 03/24 0700 In: 1352.6 [P.O.:250; I.V.:802.6; IV Piggyback:300] Out: 2215 [Urine:1665; Chest Tube:550] Intake/Output this shift:    Lab Results:  Basename 09/22/11 0423 09/21/11 0440  WBC 15.2* 18.7*  HGB 10.7* 11.2*  HCT 32.1* 33.5*  PLT 198 206   BMET:  Basename 09/22/11 0423 09/21/11 0440  NA 130* 132*  K 3.6 4.2  CL 97 99  CO2 27 26  GLUCOSE 100* 122*  BUN 8 10  CREATININE 0.63 0.68  CALCIUM 8.7 8.6    CBG (last 3)   Basename 09/22/11 0715 09/21/11 2312 09/21/11 1936  GLUCAP 108* 130* 112*    CXR:  Stable apical space, mild opacity remaining left lung  Assessment/Plan: S/P Procedure(s) (LRB): VIDEO ASSISTED THORACOSCOPY (VATS)/ LOBECTOMY (Left)  Mobilize D/C foley Chest tubes to H2O seal Transfer 3300  Katelyn [name] H 09/22/2011 9:09 AM

## 2011-09-22 NOTE — Anesthesia Postprocedure Evaluation (Signed)
Anesthesia Post Note  Patient: Katelyn [name]  Procedure(s) Performed: Procedure(s) (LRB): VIDEO ASSISTED THORACOSCOPY (VATS)/ LOBECTOMY (Left)  Anesthesia type: General  Patient location: PACU  Post pain: Pain level controlled and Adequate analgesia  Post assessment: Post-op Vital signs reviewed, Patient's Cardiovascular Status Stable, Respiratory Function Stable, Patent Airway and Pain level controlled  Last Vitals:  Filed Vitals:   09/22/11 0716  BP:   Pulse:   Temp: 36.6 C  Resp:     Post vital signs: Reviewed and stable  Level of consciousness: awake, alert  and oriented  Complications: No apparent anesthesia complications

## 2011-09-23 ENCOUNTER — Inpatient Hospital Stay (HOSPITAL_COMMUNITY): Payer: Medicare Other

## 2011-09-23 LAB — GLUCOSE, CAPILLARY
Glucose-Capillary: 133 mg/dL — ABNORMAL HIGH (ref 70–99)
Glucose-Capillary: 102 mg/dL — ABNORMAL HIGH (ref 70–99)

## 2011-09-23 LAB — BASIC METABOLIC PANEL
CO2: 26 mEq/L (ref 19–32)
Chloride: 93 mEq/L — ABNORMAL LOW (ref 96–112)
Creatinine, Ser: 0.68 mg/dL (ref 0.50–1.10)

## 2011-09-23 NOTE — Progress Notes (Signed)
3 Days Post-Op Procedure(s) (LRB): VIDEO ASSISTED THORACOSCOPY (VATS)/ LOBECTOMY (Left) Subjective: Some incisional pain  Objective: Vital signs in last 24 hours: Temp:  [97.2 F (36.2 C)-97.9 F (36.6 C)] 97.2 F (36.2 C) (03/25 0720) Pulse Rate:  [88-113] 98  (03/25 0700) Cardiac Rhythm:  [-] Normal sinus rhythm (03/24 2000) Resp:  [10-22] 16  (03/25 0700) BP: (93-147)/(39-101) 109/52 mmHg (03/25 0600) SpO2:  [88 %-99 %] 98 % (03/25 0700) Weight:  [180 lb 8.9 oz (81.9 kg)] 180 lb 8.9 oz (81.9 kg) (03/25 0500)  Hemodynamic parameters for last 24 hours:    Intake/Output from previous day: 03/24 0701 - 03/25 0700 In: 1737 [P.O.:1195; I.V.:492; IV Piggyback:50] Out: 1600 [Urine:1350; Chest Tube:250] Intake/Output this shift:    General appearance: alert and no distress Neurologic: intact Heart: regular rate and rhythm Lungs: diminished breath sounds left Abdomen: normal findings: soft, non-tender  Lab Results:  Basename 09/22/11 0423 09/21/11 0440  WBC 15.2* 18.7*  HGB 10.7* 11.2*  HCT 32.1* 33.5*  PLT 198 206   BMET:  Basename 09/23/11 0428 09/22/11 0423  NA 126* 130*  K 4.0 3.6  CL 93* 97  CO2 26 27  GLUCOSE 96 100*  BUN 12 8  CREATININE 0.68 0.63  CALCIUM 8.7 8.7    PT/INR: No results found for this basename: LABPROT,INR in the last 72 hours ABG    Component Value Date/Time   PHART 7.410* 09/21/2011 0432   HCO3 26.1* 09/21/2011 0432   TCO2 27 09/21/2011 0432   ACIDBASEDEF 2.0 09/20/2011 1922   O2SAT 90.0 09/21/2011 0432   CBG (last 3)   Basename 09/22/11 2327 09/22/11 2019 09/22/11 1536  GLUCAP 102* 133* 126*    Assessment/Plan: S/P Procedure(s) (LRB): VIDEO ASSISTED THORACOSCOPY (VATS)/ LOBECTOMY (Left) -POD #3 completion LUL RESP- stable, small air leak on water seal, both tubes at apex where there is a small apical space  D/c anterior tube CV- stable RENAL- hyponatremia CBGs WNL- d/c CBG Transfer to 3300 PAS for dvt prophylaxis    LOS:  6 days    Katelyn [name] C 09/23/2011

## 2011-09-23 NOTE — Progress Notes (Signed)
UR Completed.  Nadyne Gariepy Jane [phone] 09/23/2011  

## 2011-09-23 NOTE — Progress Notes (Signed)
Patient ID: Katelyn [name], female   DOB: 10-19-1945, 66 y.o.   MRN: [id number]   Filed Vitals:   09/23/11 1554 09/23/11 1600 09/23/11 1700 09/23/11 1800  BP:  123/56 141/52 124/79  Pulse:  89 88   Temp: 98 F (36.7 C)     TempSrc: Oral     Resp:  18 15 16   Height:      Weight:      SpO2:  95% 96%    Stable day.  Awaiting transfer to 3300.

## 2011-09-24 ENCOUNTER — Inpatient Hospital Stay (HOSPITAL_COMMUNITY): Payer: Medicare Other

## 2011-09-24 LAB — TYPE AND SCREEN
ABO/RH(D): A POS
Antibody Screen: NEGATIVE
Unit division: 0
Unit division: 0

## 2011-09-24 LAB — BASIC METABOLIC PANEL
Calcium: 8.8 mg/dL (ref 8.4–10.5)
Creatinine, Ser: 0.73 mg/dL (ref 0.50–1.10)
GFR calc Af Amer: >90 mL/min (ref >90)

## 2011-09-24 MED ORDER — OXYCODONE HCL 5 MG PO TABS
5.0000 mg | ORAL_TABLET | ORAL | Status: DC | PRN
  Filled 2011-09-24 (×2): qty 1
  Filled 2011-09-24: qty 2

## 2011-09-24 MED ORDER — OXYCODONE HCL 5 MG PO TABS: 10.0000 mg | ORAL_TABLET | ORAL | Status: DC | PRN

## 2011-09-24 MED ORDER — ACETAMINOPHEN 500 MG PO TABS
1000.0000 mg | ORAL_TABLET | Freq: Four times a day (QID) | ORAL | Status: DC
  Administered 2011-09-24 – 2011-09-28 (×13): 1000 mg via ORAL
  Filled 2011-09-24 (×23): qty 2

## 2011-09-24 MED ORDER — OXYCODONE HCL 5 MG PO TABS
10.0000 mg | ORAL_TABLET | ORAL | Status: DC | PRN
  Administered 2011-09-24 – 2011-09-29 (×22): 10 mg via ORAL
  Filled 2011-09-24 (×21): qty 2

## 2011-09-24 NOTE — Progress Notes (Signed)
   CARE MANAGEMENT NOTE 09/24/2011  Patient:  Katelyn [name], Katelyn [name]   Account Number:  [id number]  Date Initiated:  09/18/2011  Documentation initiated by:  Avie Arenas  Subjective/Objective Assessment:   Post op VATS -  Lives alone- has children.     Action/Plan:   PTA, PT INDEPENDENT, LIVES ALONE, BUT HAS 2 DAUGHTERS AND 2 SONS THAT LIVE RIGHT BESIDE HER.  THEY PLAN TO STAY WITH HER AT DISCHARGE.   Anticipated DC Date:  09/28/2011   Anticipated DC Plan:  HOME W HOME HEALTH SERVICES      DC Planning Services  CM consult      Choice offered to / List presented to:             Status of service:  In process, will continue to follow Medicare Important Message given?   (If response is "NO", the following Medicare IM given date fields will be blank) Date Medicare IM given:   Date Additional Medicare IM given:    Discharge Disposition:    Per UR Regulation:  Reviewed for med. necessity/level of care/duration of stay  If discussed at Long Length of Stay Meetings, dates discussed:    Comments:  09/23/11 Harika Laidlaw,RN,BSN 1350 MET WITH PT AND SON TO DISCUSS DC PLANS.  SON STATES FAMILY WILL PROVIDE CARE AT DISCHARGE.  WILL LIKELY NEED HOME HEALTH CARE, DEPENDING ON PT/OT RECOMMENDATIONS.  WILL FOLLOW. Phone #[phone]

## 2011-09-24 NOTE — Progress Notes (Signed)
4 Days Post-Op Procedure(s) (LRB): VIDEO ASSISTED THORACOSCOPY (VATS)/ LOBECTOMY (Left) Subjective: Some incisional pain  Objective: Vital signs in last 24 hours: Temp:  [97.7 F (36.5 C)-98.2 F (36.8 C)] 98.2 F (36.8 C) (03/26 0728) Pulse Rate:  [86-103] 92  (03/26 0400) Cardiac Rhythm:  [-] Normal sinus rhythm (03/26 0400) Resp:  [9-21] 10  (03/26 0400) BP: (90-157)/(38-97) 110/52 mmHg (03/26 0400) SpO2:  [87 %-98 %] 95 % (03/26 0400) Weight:  [181 lb (82.1 kg)] 181 lb (82.1 kg) (03/26 0500)  Hemodynamic parameters for last 24 hours:    Intake/Output from previous day: 03/25 0701 - 03/26 0700 In: 965 [P.O.:460; I.V.:505] Out: 1776 [Urine:1776] Intake/Output this shift:    General appearance: alert and mild distress Heart: regular rate and rhythm Lungs: diminished breath sounds left side Abdomen: normal findings: soft, non-tender Wound: intact  Lab Results:  Basename 09/22/11 0423  WBC 15.2*  HGB 10.7*  HCT 32.1*  PLT 198   BMET:  Basename 09/24/11 0330 09/23/11 0428  NA 127* 126*  K 4.1 4.0  CL 94* 93*  CO2 25 26  GLUCOSE 95 96  BUN 12 12  CREATININE 0.73 0.68  CALCIUM 8.8 8.7    PT/INR: No results found for this basename: LABPROT,INR in the last 72 hours ABG    Component Value Date/Time   PHART 7.410* 09/21/2011 0432   HCO3 26.1* 09/21/2011 0432   TCO2 27 09/21/2011 0432   ACIDBASEDEF 2.0 09/20/2011 1922   O2SAT 90.0 09/21/2011 0432   CBG (last 3)   Basename 09/23/11 0718 09/22/11 2327 09/22/11 2019  GLUCAP 96 102* 133*    Assessment/Plan: S/P Procedure(s) (LRB): VIDEO ASSISTED THORACOSCOPY (VATS)/ LOBECTOMY (Left) - CV stable RESP s/p lobectomy, small air leak, keep CT, pulmonary toilet, nebs prn RENAL- hyponatremia slightly better Continue to mobilize Transfer to step down   LOS: 7 days    Onita Pfluger C 09/24/2011

## 2011-09-25 ENCOUNTER — Inpatient Hospital Stay (HOSPITAL_COMMUNITY): Payer: Medicare Other

## 2011-09-25 ENCOUNTER — Encounter: Payer: Self-pay | Admitting: Thoracic Surgery (Cardiothoracic Vascular Surgery)

## 2011-09-25 MED ORDER — FUROSEMIDE 10 MG/ML IJ SOLN
40.0000 mg | Freq: Once | INTRAMUSCULAR | Status: AC
  Administered 2011-09-25: 40 mg via INTRAVENOUS
  Filled 2011-09-25: qty 4

## 2011-09-25 NOTE — Evaluation (Signed)
Occupational Therapy Evaluation Patient Details Name: Katelyn [name] MRN: [id number] DOB: 1945/11/07 Today's Date: 09/25/2011  Problem List:  Patient Active Problem List  Diagnoses  . Breast cancer  . CVA (cerebral vascular accident)  . Glomus jugulare tumor  . Pulmonary nodule, left    Past Medical History:  Past Medical History  Diagnosis Date  . 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 Pulmonary nodule, left 08/26/2011  . Hypertension   . Restless leg syndrome   . Peripheral vascular disease   . Carotid artery stenosis     bilateral  . Glomus tumor      right  . Shortness of breath     exertional  . Hypothyroidism   . Cancer     breast, bilateral s/p mast and chemo  . Shoulder pain, left     due to vascular dz  . Left hip pain     when ambulatory  . Episodic low back pain   . Depression     stress related  . Hard of hearing    Past Surgical History:  Past Surgical History  Procedure Date  . Breast surgery [id number]    bilateral mastectomy  . Portacath placement 1996    removed after chemo  . Vascular stent 2005    left subclavian  . Mastectomy     bilateral  . Tubal ligation   . Tonsillectomy     age 89  . Vascular surgery     OT Assessment/Plan/Recommendation OT Assessment Clinical Impression Statement: Pt admitted for VATS and Lt lobectomy and presents with below problem list. Will benefit from skilled OT in the acute setting to maximize I with ADL and ADL mobility to facilitate safe d/c home with family assist. OT Recommendation/Assessment: Patient will need skilled OT in the acute care venue OT Problem List: Decreased activity tolerance;Impaired balance (sitting and/or standing);Decreased range of motion;Decreased knowledge of precautions;Decreased knowledge of use of DME or AE;Pain;Cardiopulmonary status limiting activity OT Therapy Diagnosis : Generalized weakness;Acute pain OT Plan OT Frequency: Min 2X/week OT Treatment/Interventions: Self-care/ADL  training;DME and/or AE instruction;Energy conservation;Therapeutic activities;Balance training;Patient/family education OT Recommendation Follow Up Recommendations: No OT follow up;Supervision/Assistance - 24 hour Equipment Recommended: None recommended by OT Individuals Consulted Consulted and Agree with Results and Recommendations: Patient;Family member/caregiver Family Member Consulted: dtr in law OT Goals Acute Rehab OT Goals OT Goal Formulation: With patient Time For Goal Achievement: 7 days ADL Goals Pt Will Perform Grooming: with modified independence;Standing at sink ADL Goal: Grooming - Progress: Goal set today Pt Will Perform Upper Body Bathing: with supervision;with set-up;Sitting, chair;Sit to stand in shower ADL Goal: Upper Body Bathing - Progress: Goal set today Pt Will Perform Lower Body Bathing: with supervision;with set-up;Sit to stand in shower;Standing at sink ADL Goal: Lower Body Bathing - Progress: Goal set today Pt Will Perform Upper Body Dressing: Sitting, chair;Sitting, bed;Independently ADL Goal: Upper Body Dressing - Progress: Goal set today Pt Will Perform Lower Body Dressing: Independently;Sit to stand from bed;Sit to stand from chair ADL Goal: Lower Body Dressing - Progress: Goal set today Pt Will Transfer to Toilet: with modified independence;Ambulation;with DME;3-in-1 ADL Goal: Toilet Transfer - Progress: Goal set today Pt Will Perform Toileting - Hygiene: Independently;Standing at 3-in-1/toilet;Sit to stand from 3-in-1/toilet ADL Goal: Toileting - Hygiene - Progress: Goal set today Pt Will Perform Tub/Shower Transfer: Shower transfer;Ambulation;with DME;with supervision ADL Goal: Web designer - Progress: Goal set today  OT Evaluation Precautions/Restrictions  Precautions Precautions: Fall Required Braces or Orthoses: No Restrictions Weight Bearing  Restrictions: No Other Position/Activity Restrictions: left sided chest tube Prior  Functioning Home Living Lives With: Alone Receives Help From: Family Type of Home: House Home Layout: Two level;Able to live on main level with bedroom/bathroom Home Access: Stairs to enter Entrance Stairs-Rails: None Entrance Stairs-Number of Steps: 1 small step Bathroom Shower/Tub: Walk-in shower;Door [employer] Toilet: Standard Bathroom Accessibility: Yes How Accessible: Accessible via walker Home Adaptive Equipment: Bedside commode/3-in-1;Walker - rolling Additional Comments: pt's family lives right next door and can provide 24 hr supervision upon d/c Prior Function Level of Independence: Independent with basic ADLs;Independent with gait;Independent with transfers Able to Take Stairs?: Reciprically Driving: Yes 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 Eating/Feeding: Simulated;Independent Where Assessed - Eating/Feeding: Chair Grooming: Performed;Wash/dry hands;Supervision/safety;Set up Where Assessed - Grooming: Standing at sink Upper Body Bathing: Simulated;Minimal assistance Upper Body Bathing Details (indicate cue type and reason): limited by Lt chest tube Where Assessed - Upper Body Bathing: Sit to stand from chair Lower Body Bathing: Simulated;Minimal assistance Where Assessed - Lower Body Bathing: Sit to stand from bed Upper Body Dressing: Performed;Minimal assistance Upper Body Dressing Details (indicate cue type and reason): gown Where Assessed - Upper Body Dressing: Sitting, bed Lower Body Dressing: Simulated;Minimal assistance Where Assessed - Lower Body Dressing: Sit to stand from chair Toilet Transfer: Performed;Minimal assistance Toilet Transfer Details (indicate cue type and reason): Min HHA Toilet Transfer Method: Stand pivot Toilet Transfer Equipment: Set designer - Clothing Manipulation: Performed;Minimal assistance Where Assessed - Glass blower/designer Manipulation: Standing Toileting - Hygiene: Performed;Set  up;Supervision/safety Where Assessed - Toileting Hygiene: Sit to stand from 3-in-1 or toilet Tub/Shower Transfer: Not assessed Equipment Used: Rolling walker Ambulation Related to ADLs: supervision with RW ambulation. Occasional assist to navigate around objects ADL Comments: Pt doing well given sx and left chest tube Vision/Perception  Vision - History Patient Visual Report: No change from baseline Cognition Cognition Arousal/Alertness: Awake/alert Overall Cognitive Status: Appears within functional limits for tasks assessed Orientation Level: Oriented X4 Sensation/Coordination Sensation Light Touch: Appears Intact Stereognosis: Not tested Hot/Cold: Not tested Proprioception: Appears Intact Coordination Gross Motor Movements are Fluid and Coordinated: Yes Fine Motor Movements are Fluid and Coordinated: Yes Extremity Assessment RUE Assessment RUE Assessment: Within Functional Limits (Simultaneous filing. User may not have seen previous data.) LUE Assessment LUE Assessment: Within Functional Limits (Simultaneous filing. User may not have seen previous data.) Mobility  Bed Mobility Bed Mobility: Yes Supine to Sit: 5: Supervision;HOB elevated (Comment degrees) (HOB 65 deg) Transfers Sit to Stand: 4: Min assist;From bed;With upper extremity assist Sit to Stand Details (indicate cue type and reason): min A for stability Stand to Sit: 4: Min assist;To chair/3-in-1;With upper extremity assist Stand to Sit Details: min A to control descent Exercises General Exercises - Lower Extremity Ankle Circles/Pumps: AROM;15 reps;Both;Seated End of Session OT - End of Session Equipment Utilized During Treatment: Gait belt Activity Tolerance: Patient tolerated treatment well Patient left: in chair;with call bell in reach;with family/visitor present General Behavior During Session: [facility] for tasks performed Cognition: [facility] for tasks performed   Blayn Whetsell 09/25/2011, 11:10 AM

## 2011-09-25 NOTE — Progress Notes (Addendum)
[address]            [place] [address]          [phone]     5 Days Post-Op  Procedure(s) (LRB): VIDEO ASSISTED THORACOSCOPY (VATS)/ LOBECTOMY (Left) Subjective: Slowly feeling better  Objective  Telemetry SR  Temp:  [97.6 F (36.4 C)-98 F (36.7 C)] 97.6 F (36.4 C) (03/27 0439) Pulse Rate:  [82-83] 83  (03/27 0439) Resp:  [11-20] 18  (03/27 0439) BP: (113-155)/(55-74) 116/65 mmHg (03/27 0439) SpO2:  [96 %-100 %] 100 % (03/27 0439)   Intake/Output Summary (Last 24 hours) at 09/25/11 0902 Last data filed at 09/25/11 0600  Gross per 24 hour  Intake    800 ml  Output   2610 ml  Net  -1810 ml       General appearance: alert, cooperative and no distress Heart: regular rate and rhythm and S1, S2 normal Lungs: diminished in bases Abdomen: benign exam Extremities: no edema Wound: incision healing well without signs of infection  Lab Results:  Basename 09/24/11 0330 09/23/11 0428  NA 127* 126*  K 4.1 4.0  CL 94* 93*  CO2 25 26  GLUCOSE 95 96  BUN 12 12  CREATININE 0.73 0.68  CALCIUM 8.8 8.7  MG -- --  PHOS -- --   No results found for this basename: AST:2,ALT:2,ALKPHOS:2,BILITOT:2,PROT:2,ALBUMIN:2 in the last 72 hours No results found for this basename: LIPASE:2,AMYLASE:2 in the last 72 hours No results found for this basename: WBC:2,NEUTROABS:2,HGB:2,HCT:2,MCV:2,PLT:2 in the last 72 hours No results found for this basename: CKTOTAL:4,CKMB:4,TROPONINI:4 in the last 72 hours No components found with this basename: POCBNP:3 No results found for this basename: DDIMER in the last 72 hours No results found for this basename: HGBA1C in the last 72 hours No results found for this basename: CHOL,HDL,LDLCALC,TRIG,CHOLHDL in the last 72 hours No results found for this basename: TSH,T4TOTAL,FREET3,T3FREE,THYROIDAB in the last 72 hours No results found for this basename: VITAMINB12,FOLATE,FERRITIN,TIBC,IRON,RETICCTPCT in the last  72 hours  Medications: Scheduled    . acetaminophen  1,000 mg Oral Q6H  . aspirin  325 mg Oral Daily  . atorvastatin  40 mg Oral q1800  . bisacodyl  10 mg Oral Daily  . citalopram  40 mg Oral Daily  . guaiFENesin  600 mg Oral BID  . hydrochlorothiazide  12.5 mg Oral Daily  . levothyroxine  75 mcg Oral QAC breakfast  . lisinopril  10 mg Oral Daily  . polyethylene glycol  17 g Oral Daily  . potassium chloride  20 mEq Oral BID  . pramipexole  0.125 mg Oral QHS  . sodium chloride  10-40 mL Intracatheter Q12H     Radiology/Studies:  Dg Chest Port 1 View  09/25/2011  *RADIOLOGY REPORT*  Clinical Data: Status post lobectomy  PORTABLE CHEST - 1 VIEW  Comparison: Chest radiograph of 03/26 1013  Findings: There is a persistent left apical pneumothorax with left chest tube in place.  No change in volume of the pneumothorax. Central venous line unchanged.  There is volume loss in the left hemithorax.  Stable heart silhouette.  There is bibasilar atelectasis.  Mild increase in central venous congestion.  IMPRESSION:  1.  Persistent small left apical pneumothorax with chest tube in place.  2.  Mild increase in central venous pulmonary congestion.  Original Report Authenticated By: Genevive Bi, M.D.   Dg Chest [facility]  1 View  09/24/2011  *RADIOLOGY REPORT*  Clinical Data: Post lung surgery, chest tube  PORTABLE CHEST - 1 VIEW  Comparison: Portable exam 0600 hours compared to 09/23/2011  Findings: Right jugular central venous catheter, tip projecting over SVC. Left thoracostomy tube, tip at left apex. Post left thoracotomy changes. Enlargement of cardiac silhouette. Persistent atelectasis throughout left lung and at right base. Underlying emphysematous changes. Persistent left apex pneumothorax. Again noted right rib fractures. Osseous demineralization.  IMPRESSION: No interval change.  Original Report Authenticated By: Lollie Marrow, M.D.    INR: Will add last result for INR, ABG once components are  confirmed Will add last 4 CBG results once components are confirmed  Assessment/Plan: S/P Procedure(s) (LRB): VIDEO ASSISTED THORACOSCOPY (VATS)/ LOBECTOMY (Left)  1. Small pntx , + air leak , continue chest tube 2. Cont pulm toilet/rehab 3. Recheck labs in am 4. Needs some diuresis  LOS: 8 days    GOLD,[name] 3/27/20139:02 AM    Patient seen and examined. Agree with above. Wean O2 She has a tremendous amount of to and fro motion in CT at present, but if there's an air leak it is small, hopefully can d/c CT tomorrow

## 2011-09-25 NOTE — Evaluation (Signed)
Physical Therapy Evaluation Patient Details Name: Katelyn [name] MRN: [id number] DOB: Sep 23, 1945 Today's Date: 09/25/2011  Problem List:  Patient Active Problem List  Diagnoses  . Breast cancer  . CVA (cerebral vascular accident)  . Glomus jugulare tumor  . Pulmonary nodule, left    Past Medical History:  Past Medical History  Diagnosis Date  . 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 Pulmonary nodule, left 08/26/2011  . Hypertension   . Restless leg syndrome   . Peripheral vascular disease   . Carotid artery stenosis     bilateral  . Glomus tumor      right  . Shortness of breath     exertional  . Hypothyroidism   . Cancer     breast, bilateral s/p mast and chemo  . Shoulder pain, left     due to vascular dz  . Left hip pain     when ambulatory  . Episodic low back pain   . Depression     stress related  . Hard of hearing    Past Surgical History:  Past Surgical History  Procedure Date  . Breast surgery [id number]    bilateral mastectomy  . Portacath placement 1996    removed after chemo  . Vascular stent 2005    left subclavian  . Mastectomy     bilateral  . Tubal ligation   . Tonsillectomy     age 50  . Vascular surgery     PT Assessment/Plan/Recommendation PT Assessment Clinical Impression Statement: Pt is 66 yo female s/p VATS who is experiencing pain today as well as fatigue.  Recommend acute PT to progress mobility as well as HHPT at d/c. PT Recommendation/Assessment: Patient will need skilled PT in the acute care venue PT Problem List: Decreased strength;Decreased activity tolerance;Decreased mobility;Decreased knowledge of use of DME;Pain Barriers to Discharge: None PT Therapy Diagnosis : Difficulty walking;Generalized weakness;Acute pain PT Plan PT Frequency: Min 3X/week PT Treatment/Interventions: DME instruction;Gait training;Stair training;Functional mobility training;Therapeutic activities;Therapeutic exercise;Balance training;Patient/family  education PT Recommendation Follow Up Recommendations: Home health PT;Supervision/Assistance - 24 hour Equipment Recommended: None recommended by OT PT Goals  Acute Rehab PT Goals PT Goal Formulation: With patient Time For Goal Achievement: 2 weeks Pt will go Supine/Side to Sit: with HOB 0 degrees;Independently PT Goal: Supine/Side to Sit - Progress: Goal set today Pt will go Sit to Supine/Side: Independently;with HOB 0 degrees PT Goal: Sit to Supine/Side - Progress: Goal set today Pt will go Sit to Stand: with modified independence PT Goal: Sit to Stand - Progress: Goal set today Pt will go Stand to Sit: with modified independence PT Goal: Stand to Sit - Progress: Goal set today Pt will Transfer Bed to Chair/Chair to Bed: with modified independence PT Transfer Goal: Bed to Chair/Chair to Bed - Progress: Goal set today Pt will Ambulate: >150 feet;with gait velocity >(comment) ft/second;with least restrictive assistive device;with modified independence (>2.62 ft/sec) PT Goal: Ambulate - Progress: Goal set today Pt will Go Up / Down Stairs: 1-2 stairs;with least restrictive assistive device;with supervision PT Goal: Up/Down Stairs - Progress: Goal set today Pt will Perform Home Exercise Program: Independently PT Goal: Perform Home Exercise Program - Progress: Goal set today  PT Evaluation Precautions/Restrictions  Precautions Precautions: Fall Required Braces or Orthoses: No Restrictions Weight Bearing Restrictions: No Other Position/Activity Restrictions: left sided chest tube Prior Functioning  Home Living Lives With: Alone Receives Help From: Family Type of Home: House Home Layout: Two level;Able to live on main level with bedroom/bathroom  Home Access: Stairs to enter Entrance Stairs-Rails: None Entrance Stairs-Number of Steps: 1 small step Bathroom Shower/Tub: Walk-in shower;Door [employer] Toilet: Standard Bathroom Accessibility: Yes How Accessible: Accessible via  walker Home Adaptive Equipment: Bedside commode/3-in-1;Walker - rolling Additional Comments: pt's family lives right next door and can provide 24 hr supervision upon d/c Prior Function Level of Independence: Independent with basic ADLs;Independent with gait;Independent with transfers Able to Take Stairs?: Reciprically Driving: Yes Vocation: Retired Comments: Work Careers adviser Arousal/Alertness: Awake/alert Overall Cognitive Status: Appears within functional limits for tasks assessed Orientation Level: Oriented X4 Sensation/Coordination Sensation Light Touch: Appears Intact Stereognosis: Not tested Hot/Cold: Not tested Proprioception: Appears Intact Coordination Gross Motor Movements are Fluid and Coordinated: Yes Fine Motor Movements are Fluid and Coordinated: Yes Extremity Assessment RUE Assessment RUE Assessment: Within Functional Limits (Simultaneous filing. User may not have seen previous data.) LUE Assessment LUE Assessment: Within Functional Limits (Simultaneous filing. User may not have seen previous data.) RLE Assessment RLE Assessment: Exceptions to [facility] RLE Strength RLE Overall Strength Comments: pt strength grossly 4+/5 but pt fatigues very quickly with functional activity, inability to perform high reps or prolonged activity LLE Assessment LLE Assessment: Exceptions to [facility] LLE Strength LLE Overall Strength Comments: same as RLE, see note above Mobility (including Balance) Bed Mobility Bed Mobility: Yes Supine to Sit: 5: Supervision;HOB elevated (Comment degrees) (HOB 65 deg) Transfers Transfers: Yes Sit to Stand: 4: Min assist;From bed;With upper extremity assist Sit to Stand Details (indicate cue type and reason): min A for stability Stand to Sit: 4: Min assist;To chair/3-in-1;With upper extremity assist Stand to Sit Details: min A to control descent Stand Pivot Transfers: 4: Min assist;With armrests Stand Pivot Transfer Details (indicate cue  type and reason): min A for stability Ambulation/Gait Ambulation/Gait: Yes Ambulation/Gait Assistance: 4: Min assist Ambulation/Gait Assistance Details (indicate cue type and reason): pt with slow, guarded gait due to pain and fatigue.  RW helped to stabilize Ambulation Distance (Feet): 25 Feet Assistive device: Rolling walker Gait Pattern: Decreased stride length;Trunk flexed;Shuffle Gait velocity: decreased Stairs: No Wheelchair Mobility Wheelchair Mobility: No  Posture/Postural Control Posture/Postural Control: No significant limitations Balance Balance Assessed: No Exercise  General Exercises - Lower Extremity Ankle Circles/Pumps: AROM;15 reps;Both;Seated End of Session PT - End of Session Equipment Utilized During Treatment: Gait belt Activity Tolerance: Patient limited by fatigue;Patient limited by pain Patient left: in chair;with call bell in reach;with family/visitor present Nurse Communication: Mobility status for ambulation;Mobility status for transfers General Behavior During Session: [facility] for tasks performed Cognition: [facility] for tasks performed Co-eval with OT Malikah Lakey, [place]  [phone] 09/25/2011, 11:09 AM

## 2011-09-26 ENCOUNTER — Inpatient Hospital Stay (HOSPITAL_COMMUNITY): Payer: Medicare Other

## 2011-09-26 LAB — CBC
Hemoglobin: 10.7 g/dL — ABNORMAL LOW (ref 12.0–15.0)
MCHC: 32.9 g/dL (ref 30.0–36.0)
RBC: 3.31 MIL/uL — ABNORMAL LOW (ref 3.87–5.11)
WBC: 12.7 10*3/uL — ABNORMAL HIGH (ref 4.0–10.5)

## 2011-09-26 LAB — BASIC METABOLIC PANEL
BUN: 11 mg/dL (ref 6–23)
Chloride: 99 mEq/L (ref 96–112)
GFR calc Af Amer: 82 mL/min — ABNORMAL LOW (ref >90)
GFR calc non Af Amer: 71 mL/min — ABNORMAL LOW (ref >90)
Potassium: 5.2 mEq/L — ABNORMAL HIGH (ref 3.5–5.1)
Sodium: 136 mEq/L (ref 135–145)

## 2011-09-26 NOTE — Progress Notes (Signed)
   CARE MANAGEMENT NOTE 09/26/2011  Patient:  Katelyn [name], Katelyn [name]   Account Number:  [id number]  Date Initiated:  09/18/2011  Documentation initiated by:  Avie Arenas  Subjective/Objective Assessment:   Post op VATS -  Lives alone- has children.     Action/Plan:   PTA, PT INDEPENDENT, LIVES ALONE, BUT HAS 2 DAUGHTERS AND 2 SONS THAT LIVE RIGHT BESIDE HER.  THEY PLAN TO STAY WITH HER AT DISCHARGE.   Anticipated DC Date:  09/28/2011   Anticipated DC Plan:  HOME W HOME HEALTH SERVICES      DC Planning Services  CM consult      [facility] Choice  HOME HEALTH   Choice offered to / List presented to:  C-1 Patient        HH arranged  HH-1 RN  HH-2 PT      [facility] agency  [facility]   Status of service:  In process, will continue to follow Medicare Important Message given?   (If response is "NO", the following Medicare IM given date fields will be blank) Date Medicare IM given:   Date Additional Medicare IM given:    Discharge Disposition:  HOME W HOME HEALTH SERVICES  Per UR Regulation:  Reviewed for med. necessity/level of care/duration of stay  If discussed at Long Length of Stay Meetings, dates discussed:    Comments:  09/26/11 Glendale Youngblood,RN,BSN 1500 MET WITH PT AND DAUGHTER TO DISCUSS DC ARRANGEMENTS.  PT NEEDS HOME HEALTH FOLLOW UP AT DISCHARGE.  WILL ARRANGE HHRN AND HHPT FOR HOME...REFERRAL TO [facility] PER CHOICE, START OF CARE 24-48H POST DC DATE.  PT WILL LIKELY NEED HOME O2; ATTEMPTING TO WEAN  CURRENTLY, BUT STILL ON 4L/[place].  09/23/11 Darrick Greenlaw,RN,BSN 1350 MET WITH PT AND SON TO DISCUSS DC PLANS.  SON STATES FAMILY WILL PROVIDE CARE AT DISCHARGE.  WILL LIKELY NEED HOME HEALTH CARE, DEPENDING ON PT/OT RECOMMENDATIONS.  WILL FOLLOW.

## 2011-09-26 NOTE — Progress Notes (Signed)
2:30 pm - D/C CT per protocol and as ordered, pt tolerated well, occlusive dsg applied over site.  Will continue to monitor.

## 2011-09-26 NOTE — Progress Notes (Addendum)
[address]            [place] [address]          [phone]     6 Days Post-Op  Procedure(s) (LRB): VIDEO ASSISTED THORACOSCOPY (VATS)/ LOBECTOMY (Left) Subjective: Feels a little better, some productive sputum  Objective  Telemetry short bursts of supraventricular tachy/SR  Temp:  [97.6 F (36.4 C)-98.4 F (36.9 C)] 98.4 F (36.9 C) (03/28 0444) Pulse Rate:  [76-93] 76  (03/28 0444) Resp:  [18-20] 20  (03/28 0444) BP: (102-131)/(62-73) 118/64 mmHg (03/28 0444) SpO2:  [98 %-100 %] 98 % (03/28 0444)   Intake/Output Summary (Last 24 hours) at 09/26/11 0737 Last data filed at 09/26/11 9147  Gross per 24 hour  Intake   1830 ml  Output   2752 ml  Net   -922 ml       General appearance: alert, cooperative, fatigued and no distress Heart: regular rate and rhythm and S1, S2 normal Lungs: coarse BS throughout Abdomen: soft, + BS, Non-tender Extremities: no edema Wound: incision healing well  Lab Results:  Basename 09/26/11 0625 09/24/11 0330  NA 136 127*  K 5.2* 4.1  CL 99 94*  CO2 32 25  GLUCOSE 94 95  BUN 11 12  CREATININE 0.84 0.73  CALCIUM 9.3 8.8  MG -- --  PHOS -- --   No results found for this basename: AST:2,ALT:2,ALKPHOS:2,BILITOT:2,PROT:2,ALBUMIN:2 in the last 72 hours No results found for this basename: LIPASE:2,AMYLASE:2 in the last 72 hours  Basename 09/26/11 0625  WBC 12.7*  NEUTROABS --  HGB 10.7*  HCT 32.5*  MCV 98.2  PLT 328   No results found for this basename: CKTOTAL:4,CKMB:4,TROPONINI:4 in the last 72 hours No components found with this basename: POCBNP:3 No results found for this basename: DDIMER in the last 72 hours No results found for this basename: HGBA1C in the last 72 hours No results found for this basename: CHOL,HDL,LDLCALC,TRIG,CHOLHDL in the last 72 hours No results found for this basename: TSH,T4TOTAL,FREET3,T3FREE,THYROIDAB in the last 72 hours No results found for this basename:  VITAMINB12,FOLATE,FERRITIN,TIBC,IRON,RETICCTPCT in the last 72 hours  Medications: Scheduled    . acetaminophen  1,000 mg Oral Q6H  . aspirin  325 mg Oral Daily  . atorvastatin  40 mg Oral q1800  . bisacodyl  10 mg Oral Daily  . citalopram  40 mg Oral Daily  . furosemide  40 mg Intravenous Once  . guaiFENesin  600 mg Oral BID  . hydrochlorothiazide  12.5 mg Oral Daily  . levothyroxine  75 mcg Oral QAC breakfast  . lisinopril  10 mg Oral Daily  . polyethylene glycol  17 g Oral Daily  . potassium chloride  20 mEq Oral BID  . pramipexole  0.125 mg Oral QHS  . sodium chloride  10-40 mL Intracatheter Q12H     Radiology/Studies:  Dg Chest Port 1 View  09/25/2011  *RADIOLOGY REPORT*  Clinical Data: Status post lobectomy  PORTABLE CHEST - 1 VIEW  Comparison: Chest radiograph of 03/26 1013  Findings: There is a persistent left apical pneumothorax with left chest tube in place.  No change in volume of the pneumothorax. Central venous line unchanged.  There is volume loss in the left hemithorax.  Stable heart silhouette.  There is bibasilar atelectasis.  Mild increase in central venous congestion.  IMPRESSION:  1.  Persistent small left apical pneumothorax with  chest tube in place.  2.  Mild increase in central venous pulmonary congestion.  Original Report Authenticated By: Genevive Bi, M.D.    INR: Will add last result for INR, ABG once components are confirmed Will add last 4 CBG results once components are confirmed  Assessment/Plan: S/P Procedure(s) (LRB): VIDEO ASSISTED THORACOSCOPY (VATS)/ LOBECTOMY (Left)  1. CXR not done yet, no air leak today, prob d/c chest tube today 2.cont nebs, pulm toilet, add flutter valve, wean O2 3. Routine rehab  LOS: 9 days    GOLD,[name] 3/28/20137:37 AM    Feels better with CT out.  Down to 2 L [place]  Needs to be ambulating better prior to d/c- hoping to be home for Easter

## 2011-09-26 NOTE — Progress Notes (Signed)
Called to assess patient with shortness of breath s/p removal of Right IJ cath. Upon arrival patient 02 sats 99% on 4L, titrated down to 3L sats 96%, patient appears more relaxed and family states patient looks 'better'. Patient moving air, no audible crackles or wheezes. Vital signs stable, BP 130/78, which according to family is closer to the patient's normal than what it was right after catheter removal. Patient has history of SOB upon exertion and had just moved from chair to bed prior to catheter removal. Advised bedside RN, Reden, to make MD aware of situation. Will continue to monitor.

## 2011-09-26 NOTE — Progress Notes (Signed)
Patient's chest tube had 0 output from 7pm to 7am.

## 2011-09-26 NOTE — Progress Notes (Signed)
4:20 pm - pt ambulated 94ft with RW x 1 assist, 4LO2 [place], steady gait. Pt c/o of dizziness when initially standing up. Returned to chair, sats 100% on 4LO2, decreased O2 to 3L.  Will continue to monitor.

## 2011-09-27 ENCOUNTER — Inpatient Hospital Stay (HOSPITAL_COMMUNITY): Payer: Medicare Other

## 2011-09-27 DIAGNOSIS — C3490 Malignant neoplasm of unspecified part of unspecified bronchus or lung: Secondary | ICD-10-CM

## 2011-09-27 DIAGNOSIS — Z902 Acquired absence of lung [part of]: Secondary | ICD-10-CM

## 2011-09-27 LAB — TSH: TSH: 36.244 u[IU]/mL — ABNORMAL HIGH (ref 0.350–4.500)

## 2011-09-27 MED ORDER — METOPROLOL TARTRATE 12.5 MG HALF TABLET
12.5000 mg | ORAL_TABLET | Freq: Two times a day (BID) | ORAL | Status: DC
  Administered 2011-09-27 – 2011-09-29 (×5): 12.5 mg via ORAL
  Filled 2011-09-27 (×6): qty 1

## 2011-09-27 MED ORDER — METOPROLOL TARTRATE 12.5 MG HALF TABLET: 12.5000 mg | ORAL_TABLET | Freq: Two times a day (BID) | ORAL | Status: DC

## 2011-09-27 MED ORDER — OXYCODONE HCL 5 MG PO TABS: 5.0000 mg | ORAL_TABLET | ORAL | Status: DC | PRN

## 2011-09-27 MED ORDER — GUAIFENESIN ER 600 MG PO TB12: 600.0000 mg | ORAL_TABLET | Freq: Two times a day (BID) | ORAL | Status: DC

## 2011-09-27 MED ORDER — FUROSEMIDE 10 MG/ML IJ SOLN
40.0000 mg | Freq: Once | INTRAMUSCULAR | Status: AC
  Administered 2011-09-27: 40 mg via INTRAVENOUS
  Filled 2011-09-27: qty 4

## 2011-09-27 MED ORDER — OXYCODONE HCL 10 MG PO TABS: 10.0000 mg | ORAL_TABLET | ORAL | Status: DC | PRN

## 2011-09-27 NOTE — Progress Notes (Signed)
   CARE MANAGEMENT NOTE 09/27/2011  Patient:  Katelyn [name], Katelyn [name]   Account Number:  [id number]  Date Initiated:  09/18/2011  Documentation initiated by:  Avie Arenas  Subjective/Objective Assessment:   Post op VATS -  Lives alone- has children.     Action/Plan:   PTA, PT INDEPENDENT, LIVES ALONE, BUT HAS 2 DAUGHTERS AND 2 SONS THAT LIVE RIGHT BESIDE HER.  THEY PLAN TO STAY WITH HER AT DISCHARGE.   Anticipated DC Date:  09/28/2011   Anticipated DC Plan:  HOME W HOME HEALTH SERVICES      DC Planning Services  CM consult      [facility] Choice  HOME HEALTH   Choice offered to / List presented to:  C-1 Patient   DME arranged  [facility]      DME agency  [facility]     HH arranged  HH-1 RN  HH-2 PT      [facility] agency  [facility]   Status of service:  Completed, signed off Medicare Important Message given?   (If response is "NO", the following Medicare IM given date fields will be blank) Date Medicare IM given:   Date Additional Medicare IM given:    Discharge Disposition:  HOME W HOME HEALTH SERVICES  Per UR Regulation:  Reviewed for med. necessity/level of care/duration of stay  If discussed at Long Length of Stay Meetings, dates discussed:    Comments:  09/27/11 Miangel Flom,RN,BSN 1300 PT WILL NEED HOME O2, AS RESTING ROOM AIR SATS 86%. REFERRAL TO [facility] WITH [facility] FOR DME NEEDS. Phone #[phone]   09/26/11 Indyah Saulnier,RN,BSN 1500 MET WITH PT AND DAUGHTER TO DISCUSS DC ARRANGEMENTS.  PT NEEDS HOME HEALTH FOLLOW UP AT DISCHARGE.  WILL ARRANGE HHRN AND HHPT FOR HOME...REFERRAL TO [facility] PER CHOICE, START OF CARE 24-48H POST DC DATE.  PT WILL LIKELY NEED HOME O2; ATTEMPTING TO WEAN  CURRENTLY, BUT STILL ON 4L/[place]. Phone #[phone]  09/23/11 Ron Junco,RN,BSN 1350 MET WITH PT AND SON TO DISCUSS DC PLANS.  SON STATES FAMILY WILL PROVIDE CARE AT DISCHARGE.  WILL LIKELY NEED HOME HEALTH CARE, DEPENDING ON PT/OT RECOMMENDATIONS.  WILL FOLLOW. Phone  #[phone]

## 2011-09-27 NOTE — Progress Notes (Signed)
Resting room air sat 86%.  2L O2 [place] reapplied, sats increased to 94%.  Will continue to monitor.

## 2011-09-27 NOTE — Progress Notes (Signed)
Pt has shortness of breath after removal of right IJ cath,pt HR is 110 sinus,O2 sat is 84%on 2LPM/[place],O2 increase to 4LPM,RR is 24,Bp is 174/63mmHg.Breath sounds were clear no audible crackles or wheezes.Rapid response nurse called to assess the pt.Will Continue to monitor.. 22:25 PM assessment done by rapid response nurse,Pt feels much better,Dr. Zenaida Niece Tright made aware,with order to do stat portable chest X-ray and to monitor V/S every 30 mins for 3 hours,will continue to monitor Katelyn Burklow RN

## 2011-09-27 NOTE — Progress Notes (Signed)
Physical Therapy Treatment Patient Details Name: Katelyn [name] MRN: [id number] DOB: 11/30/1945 Today's Date: 09/27/2011  PT Assessment/Plan  PT - Assessment/Plan Comments on Treatment Session: pt. with good improvement this session, no physical (A) needed. monitored 02 sat with activity, see ambulation details.  PT Frequency: Min 3X/week Follow Up Recommendations: Home health PT;Supervision/Assistance - 24 hour PT Goals  Acute Rehab PT Goals PT Goal Formulation: With patient Time For Goal Achievement: 2 weeks PT Goal: Sit to Stand - Progress: Progressing toward goal PT Goal: Stand to Sit - Progress: Progressing toward goal PT Goal: Ambulate - Progress: Progressing toward goal  PT Treatment Precautions/Restrictions  Precautions Precautions: Fall Required Braces or Orthoses: No Restrictions Weight Bearing Restrictions: No Other Position/Activity Restrictions: left sided chest tube Mobility (including Balance) Bed Mobility Bed Mobility: No Transfers Transfers: Yes Sit to Stand: 5: Supervision;From chair/3-in-1;With armrests;With upper extremity assist Sit to Stand Details (indicate cue type and reason): supervision for safety, pt. appeared steady. demonstrated proper hand placement and technique.  Stand to Sit: 5: Supervision;With upper extremity assist;With armrests;To chair/3-in-1 Stand to Sit Details: supervision for safety due to min (A) previous session, demonstrated proper technique and hand placement. Ambulation/Gait Ambulation/Gait: Yes Ambulation/Gait Assistance: Other (comment) (min guard (A)) Ambulation/Gait Assistance Details (indicate cue type and reason): monitored 02 sat with ambulation today. 97% at rest, 90% after amb 50 feet, back to 97% with sitting and cues for breathing. pt. tolerated well. ambulated with portable 02 2L. Ambulation Distance (Feet): 75 Feet Assistive device: Rolling walker Gait Pattern: Step-through pattern;Decreased stride length Gait  velocity: decreased Stairs: No  Posture/Postural Control Posture/Postural Control: No significant limitations Exercise  General Exercises - Lower Extremity Ankle Circles/Pumps: AROM;Right;20 reps;Seated Gluteal Sets: AROM;Strengthening;Both;10 reps;Seated Long Arc Quad: AROM;Strengthening;Both;10 reps;Seated Hip Flexion/Marching: AROM;Strengthening;Both;Other reps (comment);Seated (20 reps) End of Session PT - End of Session Equipment Utilized During Treatment: Gait belt Activity Tolerance: Patient tolerated treatment well;Patient limited by fatigue Patient left: in chair;with call bell in reach Nurse Communication: Mobility status for ambulation;Mobility status for transfers General Behavior During Session: [facility] for tasks performed Cognition: [facility] for tasks performed  Ardyth Gal SPTA 09/27/2011, 3:05 PM

## 2011-09-27 NOTE — Progress Notes (Signed)
Brief Nutrition Note:  Reason: Poor PO  Wt: 82.1 kg Usual Wt: 160-165 lb (per pt report) Ht: 5\' 7"  (1.702 m) BMI: 28.35 kg/(m^2). Overweight  Chart Reviewed. Pt with 15 lb weight increase from usual weight. Pt states she thinks this is mostly fluid and feels her feet are still swollen. Pt received IV lasix today. PO intake variable, 10-75% of meals. RN states pt was eating better before yesterday but started not feeling well.  Pt with [employer] milkshake when I spoke to her. She states her appetite has been poor since the surgery. Pt does not want any supplements or snacks at this time. Pt to be discharged today.  No interventions identified at this time, due to pt refusal.  Please consult if any issues identified.  Katelyn [name] M Pager#: [phone]  Katelyn [name] [phone]

## 2011-09-27 NOTE — Discharge Instructions (Addendum)
Thoracotomy Care After Refer to this sheet in the next few weeks. These instructions provide you with information on caring for yourself after your procedure. Your caregiver may also give you more specific instructions. Your treatment has been planned according to current medical practices, but problems sometimes occur. Call your caregiver if you have any problems or questions after your procedure. HOME CARE INSTRUCTIONS  Remove the bandage (dressing) over your chest tube site as directed by your caregiver.   It is normal to be sore for a couple weeks following surgery. See your caregiver if this seems to be getting worse rather than better.   Only take over-the-counter or prescription medicines for pain, discomfort, or fever as directed by your caregiver. It is very important to take pain medicine when you need it so that you will cough and breathe deeply enough to clear mucus (phlegm) and expand your lungs. This helps prevent a lung infection (pneumonia).   If it hurts to cough, hold a pillow against your chest when you cough. This may help with the discomfort. In spite of the discomfort, cough frequently.   Taking deep breaths keeps lungs inflated and protects against pneumonia. Most patients will go home with a device called an incentive spirometer that encourages deep breathing.   You may resume a normal diet and activities as directed.   Use showers for bathing until you see your caregiver, or as instructed.   Change dressings if necessary or as directed.   Avoid lifting or driving until you are instructed otherwise.   Make an appointment to see your caregiver for stitch (suture) or staple removal when instructed.   Do not travel by airplane for 2 weeks after the chest tube is removed.  SEEK MEDICAL CARE IF:  You are bleeding from your wounds.   Your heartbeat seems irregular.   You have redness, swelling, or increasing pain in the wounds.   There is pus coming from your  wounds.   There is a bad smell coming from the wound or dressing.  SEEK IMMEDIATE MEDICAL CARE IF:  You have a fever.   You develop a rash.   You have difficulty breathing.   You develop any reaction or side effects to medicines given.   You develop lightheadedness or feel faint.   You develop shortness of breath or chest pain.  MAKE SURE YOU:  Understand these instructions.   Will watch your condition.   Will get help right away if you are not doing well or get worse.  Document Released: 11/30/2010 Document Revised: 06/06/2011 Document Reviewed: 11/30/2010 [facility] Patient Information 2012 [place], [place].  Lung Resection Care After Refer to this sheet in the next few weeks. These instructions provide you with information on caring for yourself after your procedure. Your caregiver may also give you more specific instructions. Your treatment has been planned according to current medical practices, but problems sometimes occur. Call your caregiver if you have any problems or questions after your procedure. HOME CARE INSTRUCTIONS  You may resume a normal diet and activities as directed.   Do not smoke or use tobacco products.   Change your bandages (dressings) as directed.   Only take over-the-counter or prescription medicines for pain, discomfort, or fever as directed by your caregiver.   Keep all follow-up appointments as directed.   Try to breathe deeply and cough as directed. Holding a pillow firmly over your ribs may help with discomfort.   If you were given an incentive spirometer in the  hospital, continue to use it as directed.   Walk as directed by your caregiver.   You may take a shower and gently wash the area of your surgical cut (incision) with water and soap as directed. Do not use anything else to clean your incision except as directed by your caregiver. Do not take baths or sit in a hot tub.  SEEK MEDICAL CARE IF:  You notice redness, swelling, or  increasing pain in the incision.   You are bleeding from the incision.   You see pus coming from the incision.   You notice a bad smell coming from the incision or dressing.   Your incision breaks open.   You cough up blood or pus, or you develop a cough that produces bad smelling sputum.   You have pain or swelling in your legs.   You have increasing pain that is not controlled with medicine.   You have trouble managing any of the tubes that have been left in place after surgery.  SEEK IMMEDIATE MEDICAL CARE IF:   You have a fever or chills.   You have any reaction or side effects to medicines given.   You have chest pain or an irregular or rapid heartbeat.   You have dizzy episodes or fainting.   You have shortness of breath or difficulty breathing.   You have persistent nausea or vomiting.   You have a rash.  MAKE SURE YOU:  Understand these instructions.   Will watch your condition.   Will get help right away if you are not doing well or get worse.  Document Released: 01/04/2005 Document Revised: 06/06/2011 Document Reviewed: 02/14/2011 ExitCare Patient Information 2012 ExitCare, [facility]  Lung Cancer Lung cancer is a tumor which starts as a growth in your lungs. Cancer is a group of many related diseases that begin in cells, the building blocks of the body. Normally, cells grow and divide to produce more cells only when the body needs them. Sometimes cells keep dividing when new cells are not needed. These extra cells may form a mass of tissue called a growth or tumor. Tumors can be either benign (not cancerous) or malignant (cancerous). Cancer can begin in any organ or tissue of the body. The original tumor (where the tumor started out) is called the primary cancer and is usually named for where it begins.  Lung cancer is the most common cause of cancer death in men and women. There are several different types of lung cancers. Usually, lung cancer is described as either  small-cell lung cancer or non-small-cell lung cancer. Other types of cancer occur in the lungs, including carcinoid and cancers spread from other organs. The types of cancer have different behavior and treatment. CAUSES  This cancer usually starts when the lungs are exposed to harmful chemicals. When you quit smoking, your risk of lung cancer falls each year (but is never the same as a person who has never smoked).  Other risks include:   Radon gas exposure.   Asbestos and other industrial substance exposure.   Second hand tobacco smoke.   Air pollution.   Family or personal history of lung cancer.   Age over 50.  SYMPTOMS  Lung cancer can cause many symptoms. They depend on the type of cancer, its location and other factors. Symptoms of lung cancer can include:  Cough (either new, different or more severe).   Shortness of breath.   Coughing up blood (hemoptysis).   Chest pain.   Hoarseness.  Swelling of the face.   Drooping eyelid.   Changes in blood tests: low sodium (hyponatremia), high calcium (hypercalcemia) or low blood count (anemia).   Weight loss.  In its early stages, lung cancer may not have symptoms and can be discovered by accident. Many of the symptoms above can be caused by diseases other than lung cancer. DIAGNOSIS  In early lung cancer, the patient often does not notice problems. It usually has spread by the time problems are first noticed. Your caregiver may suspect lung cancer based on your symptoms, your exam or based on tests (such as x-rays) obtained for other reasons. Common tests that help your caregiver diagnose your condition include:  Chest x-ray.   CT scan of the lungs and chest.   Blood tests.  If a tumor is found, a biopsy will be necessary to confirm that cancer is present and to determine the type of cancer. TREATMENT   Surgery offers a hope for a cure if the cancer has not spread and the cancer is not a small cell (oat cell) cancer of  the lung. Surgery cannot cure the small cell type of cancer.   Radiation Therapy is a form of high energy X-ray that helps slow or kill the cancer. It is often used along with medications (chemotherapy) to help treat the cancer and control pain.   Chemotherapy is used in combination with surgery in advanced cancer. It is also used in all small cell cancers.   Many new treatments look promising.   Your caregiver can give you more information and discuss treatment options that are best for your type of cancer.  HOME CARE INSTRUCTIONS   If you smoke, stop!   Take all medications as told.   Keep all appointments with your caregiver and other specialists.   Ask your caregiver if you should see a cancer specialist, if that has not been arranged.   If you require oxygen or breathing equipment, be sure you know how to use it and who to call with questions.   Follow any special diet directions. If you have problems with appetite, ask your caregiver for help.  SEEK MEDICAL CARE IF:   You have had a surgical procedure are you are having trouble recovering.   You have ongoing weight loss.   You have decreased strength or energy past the point when your caregiver said you would feel better.   You develop nausea or lightheadedness.   You have pain that is not improving.  SEEK IMMEDIATE MEDICAL CARE IF:   You cough up clotted blood or bright red blood.   Your pain is uncontrolled.   You develop new difficulty breathing or chest pain.   You develop swelling in one or both ankles or legs, or swelling in your face or neck.   You develop new headache or confusion.  Document Released: 09/23/2000 Document Revised: 06/06/2011 Document Reviewed: 07/04/2008 [facility] Patient Information 2012 [place], [place].     Home Oxygen, HHRN and Physical Therapy to be provided by [facility] Phone # [phone]

## 2011-09-27 NOTE — Progress Notes (Signed)
Occupational Therapy Treatment Patient Details Name: Katelyn [name] MRN: [id number] DOB: Jan 02, 1946 Today's Date: 09/27/2011  OT Assessment/Plan OT Assessment/Plan OT Plan: Discharge plan remains appropriate OT Goals ADL Goals ADL Goal: Grooming - Progress: Met ADL Goal: Lower Body Dressing - Progress: Progressing toward goals ADL Goal: Toilet Transfer - Progress: Progressing toward goals ADL Goal: Toileting - Hygiene - Progress: Progressing toward goals ADL Goal: Tub/Shower Transfer - Progress: Progressing toward goals  OT Treatment Precautions/Restrictions  Precautions Precautions: Fall Required Braces or Orthoses: No Restrictions Weight Bearing Restrictions: No   ADL ADL Grooming: Performed;Independent;Wash/dry hands Where Assessed - Grooming: Standing at sink Toilet Transfer: Research scientist (life sciences) Details (indicate cue type and reason): sit<->stand from toilet. VC for safe hand placement Toilet Transfer Equipment: Regular height toilet;Grab bars Toileting - Clothing Manipulation: Performed;Supervision/safety Where Assessed - Toileting Clothing Manipulation: Standing Toileting - Hygiene: Performed;Independent Where Assessed - Toileting Hygiene: Sit on 3-in-1 or toilet Tub/Shower Transfer: Performed;Minimal assistance Tub/Shower Transfer Details (indicate cue type and reason): VC for hand placement and assist to steady as pt did not leave enough space for left foot to come into shower on entry Tub/Shower Transfer Method: Ambulating Tub/Shower Transfer Equipment: Grab bars;Walk in shower Equipment Used: Rolling walker Ambulation Related to ADLs: Supervision with RW ambulation ADL Comments: Pt with increased work of breathing. O2 sats remained in 90's during majority of mobility and dropped to 88% towards end of session. 2L 02 reapplied. Unable to simulated UB/LB bathing secondary pt fatigue and "winded" Mobility  Bed Mobility Bed Mobility:  No  End of Session OT - End of Session Equipment Utilized During Treatment: Gait belt Activity Tolerance:  (limited by work of breathing and fatigue) Patient left: in chair;with call bell in reach;with family/visitor present  Ura Yingling  09/27/2011, 1:34 PM

## 2011-09-27 NOTE — Progress Notes (Addendum)
[address]            [place] [address]          [phone]     7 Days Post-Op  Procedure(s) (LRB): VIDEO ASSISTED THORACOSCOPY (VATS)/ LOBECTOMY (Left) Subjective: Short of breath at times, remains on O2 at 2 liters, some sputum production but less  Objective  Telemetry episodes of Sinus tachy  Temp:  [97.7 F (36.5 C)-98.8 F (37.1 C)] 97.7 F (36.5 C) (03/29 0644) Pulse Rate:  [83-94] 83  (03/29 0644) Resp:  [18-20] 20  (03/29 0644) BP: (102-164)/(54-76) 159/70 mmHg (03/29 0644) SpO2:  [96 %-100 %] 96 % (03/29 0644)   Intake/Output Summary (Last 24 hours) at 09/27/11 0742 Last data filed at 09/27/11 0647  Gross per 24 hour  Intake   1440 ml  Output   2310 ml  Net   -870 ml       General appearance: alert, cooperative, fatigued and no distress Heart: regular rate and rhythm and S1, S2 normal Lungs: ronchi, clearing pretty well with cough Abdomen: soft, nontender Extremities: no edema Wound: incision healing well  Lab Results:  Basename 09/26/11 0625  NA 136  K 5.2*  CL 99  CO2 32  GLUCOSE 94  BUN 11  CREATININE 0.84  CALCIUM 9.3  MG --  PHOS --   No results found for this basename: AST:2,ALT:2,ALKPHOS:2,BILITOT:2,PROT:2,ALBUMIN:2 in the last 72 hours No results found for this basename: LIPASE:2,AMYLASE:2 in the last 72 hours  Basename 09/26/11 0625  WBC 12.7*  NEUTROABS --  HGB 10.7*  HCT 32.5*  MCV 98.2  PLT 328   No results found for this basename: CKTOTAL:4,CKMB:4,TROPONINI:4 in the last 72 hours No components found with this basename: POCBNP:3 No results found for this basename: DDIMER in the last 72 hours No results found for this basename: HGBA1C in the last 72 hours No results found for this basename: CHOL,HDL,LDLCALC,TRIG,CHOLHDL in the last 72 hours No results found for this basename: TSH,T4TOTAL,FREET3,T3FREE,THYROIDAB in the last 72 hours No results found for this basename:  VITAMINB12,FOLATE,FERRITIN,TIBC,IRON,RETICCTPCT in the last 72 hours  Medications: Scheduled    . acetaminophen  1,000 mg Oral Q6H  . aspirin  325 mg Oral Daily  . atorvastatin  40 mg Oral q1800  . bisacodyl  10 mg Oral Daily  . citalopram  40 mg Oral Daily  . guaiFENesin  600 mg Oral BID  . hydrochlorothiazide  12.5 mg Oral Daily  . levothyroxine  75 mcg Oral QAC breakfast  . lisinopril  10 mg Oral Daily  . polyethylene glycol  17 g Oral Daily  . pramipexole  0.125 mg Oral QHS  . sodium chloride  10-40 mL Intracatheter Q12H  . DISCONTD: potassium chloride  20 mEq Oral BID     Radiology/Studies:  Dg Chest Port 1 View  09/26/2011  *RADIOLOGY REPORT*  Clinical Data: Shortness of breath following right central venous catheter removal. Recent left lobectomy/VATS.  PORTABLE CHEST - 1 VIEW  Comparison: 09/26/2011 and prior chest radiographs  Findings: A right IJ central venous catheter has been removed. There is no evidence of right pneumothorax.  A left apical pneumothorax is unchanged, approximately 5-10%. Left thoracic postoperative changes are again identified with interstitial prominence in the remaining left lung. A left subclavian vascular stent left axillary surgical clips are again noted. No evidence of pleural effusion. The cardiomediastinal silhouette is stable.  IMPRESSION: Right IJ central venous catheter removal - no evidence of right pneumothorax.  Stable 5-10% left apical pneumothorax and left thoracic postoperative changes.  Original Report Authenticated By: Rosendo Gros, M.D.   Dg Chest Port 1 View  09/26/2011  *RADIOLOGY REPORT*  Clinical Data: Status post left chest tube removal.  PORTABLE CHEST - 1 VIEW  Comparison: Earlier today.  Findings: Interval removal of the left chest tube.  No significant change in an approximately 5-10% left apical pneumothorax.  Stable left subclavian artery stent and left axillary surgical clips. Left breast skin clips are again demonstrated.   Mildly increased ill-defined opacity at the right lung base.  No significant change in left lung density and old, healed fractures on the left.  The left hemidiaphragm remains mildly elevated and surgical staples are again demonstrated in the left lower lung zone.  IMPRESSION:  1.  Stable 5-10% left apical pneumothorax following left chest tube removal. 2.  Mild right basilar atelectasis, increased. 3.  No significant change in left lung atelectasis/scarring.  Original Report Authenticated By: Darrol Angel, M.D.   Dg Chest Port 1 View  09/26/2011  *RADIOLOGY REPORT*  Clinical Data: Follow up chest tube  PORTABLE CHEST - 1 VIEW  Comparison: 09/25/2011  Findings: Cardiomediastinal silhouette is stable.  Stable left chest tube position.  Stable small left apical pneumothorax.  Again noted atelectasis in the left lung.  Stable left hemithorax volume loss and surgical clips in the left hilum. A vascular stent the left upper mediastinum is stable. Right IJ central line with tip in SVC is unchanged in position.  Stable postsurgical changes left breast.  Stable old left upper rib fractures.  No convincing pulmonary edema.  Minimal right basilar atelectasis.   IMPRESSION: Stable left chest tube position.  Stable small left apical pneumothorax.  Again noted atelectasis in the left lung.  Stable left hemithorax volume loss and surgical clips in the left hilum. A vascular stent the left upper mediastinum is stable.  No pulmonary edema.  Original Report Authenticated By: Natasha Mead, M.D.    INR: Will add last result for INR, ABG once components are confirmed Will add last 4 CBG results once components are confirmed  Assessment/Plan: S/P Procedure(s) (LRB): VIDEO ASSISTED THORACOSCOPY (VATS)/ LOBECTOMY (Left) 1. CXR fairly stable , pulm toilet an issue, cont IS/flutter valve, could consider abx for bronchitis component  2. Increase activity as able 3. Wean O2 as able 4. Will add low dose beta blocker/check TSH  with episodes of Stach/svt and on synthroid  LOS: 10 days    GOLD,[name] 3/29/20137:42 AM    Patient seen and examined. Agree with above.  She is making slow but steady progress. Still on O2, she's still about 7 lbs above preop weight, will give lasix this AM  Hopefully home Sunday AM May need home O2

## 2011-09-27 NOTE — Progress Notes (Signed)
UR Completed.  Katelyn [name] Jane [phone] 09/27/2011  

## 2011-09-27 NOTE — Discharge Summary (Signed)
Physician Discharge Summary  Patient ID: LORELEE [name] MRN: [id number] DOB/AGE: 01-06-1946 66 y.o.  Admit date: 09/17/2011 Discharge date: 09/27/2011  Admission Diagnoses:  Patient Active Problem List  Diagnoses  . Breast cancer  . CVA (cerebral vascular accident)  . Glomus jugulare tumor  . Pulmonary nodule, left   Discharge Diagnoses:   Patient Active Problem List  Diagnoses  . Breast cancer  . CVA (cerebral vascular accident)  . Glomus jugulare tumor  . Pulmonary nodule, left  . S/P lobectomy of lung  . Squamous cell carcinoma lung    Discharged Condition: good  History of Present Illness:   Katelyn [name] is a 66 yo female with history of 2 breast cancers.  The most recent was inflammatory of her left breast, treated with surgery, radiation, and chemotherapy approximately 16 years ago.  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  This included a CTA of the neck which revealed the possibility of lung nodules.  Therefore, she underwent PET CT performed at [facility], which revealed 2 hypermetabolic irregular nodule densities near the left apex one located anterior and one posteriorly.  These were reviewed by her PCP and it was felt she should be evaluated by Thoracic surgery.  She was seen by Dr. Dorris Fetch on 09/06/2011 who discussed multiple treatment options.  The patient decided to undergo Thoracotomy with possible wedge resection or lobectomy.  This was scheduled for 09/17/2011 on an outpatient basis.    Hospital Course:   Katelyn [name] presented to [facility] and underwent a left sided Video Assisted Thoracotomy with Wedge resection x2 of the Left Upper Lobe.  The patient tolerated the procedure well and was extubated and taken to the PACU in stable condition.  POD #1 the patient's arterial line was removed without difficulty. Chest tubes remained in place due to air leak.  POD #2 pathology results revealed invasive squamous cell carcinoma  with positive lung margins.  Dr. Dorris Fetch discussed the results with the patient and her family and it was decided to return to the OR and perform a Left Upper Lobe Lobectomy. POD #3 the patient was taken to the OR and underwent Redo Video Assisted Thoracotomy with Completion of the Left Upper Lobectomy.  The remaining lobe was sent for pathology which confirmed negative margins.  The patient again tolerated the procedure well.  She was extubated and taken to the PACU in stable condition.  POD #1/4 the patient's femoral arterial line was removed.  POD #2/5 the patients foley catheter was removed.  Her chest tubes were placed on water seal.  POD #3/6 the patient's anterior chest tube was removed.  POD #4/7 the patient was doing well.  She was transferred to the stepdown unit.  POD #6/9 the patients final chest tube was removed.  She developed short bursts of SVT.  POD #7/10 the patient having episodes of sinus tach.  Patient is experiencing HTN, will start low dose Metoprolol 12.5mg  BID.  Her TSH will be checked to ensure level is appropriate.  She will also be given a dose of Lasix for additional diuresis, she remains 7lbs above preoperative weight.  The patient continues to require oxygen and is now suffering from a productive cough.  If unable to wean oxygen, will arrange home use.  If necessary will start empiric antibiotics for possible bronchitis.  Despite these 2 issues, the patient is doing well.  If she continues to improve she will likely be discharged in the  next 24-48 hours.  Significant Diagnostic Studies:   Pathology report:  1. Lymph node, biopsy, #10 - ONE LYMPH NODE, NEGATIVE FOR METASTATIC CARCINOMA (0/1). 2. Lymph node, biopsy, # 11 - ONE LYMPH NODE, NEGATIVE FOR METASTATIC CARCINOMA (0/1). 3. Lung, resection (segmental or lobe), Remainder of Left Upper Lobe - INVASIVE MODERATELY DIFFERENTIATED SQUAMOUS CELL CARCINOMA, MULTIPLE AREAS, LARGEST ONE MEASURING 1.3 CM FROM THE GLASS  SLIDE. - BACKGROUND SQUAMOUS CELL CARCINOMA IN SITU. - NO EVIDENCE OF ANGIOLYMPHATIC INVASION OR VISCERAL PLEURAL INVOLVEMENT IDENTIFIED. - RESECTION MARGIN CLEAR. 4. Lung, resection (segmental or lobe), Remainder of Left Upper Lobe # 2 - LUNG PARENCHYMA WITH HEMORRHAGE AND REACTIVE CHANGES. - NO EVIDENCE OF MALIGNANCY.  Disposition: Home, Possible Home Oxygen use  Discharge Orders    Future Appointments: Provider: Department: Dept Phone: Center:   10/03/2011 10:00 AM Rachael Fee Whitney [phone] None   10/03/2011 10:30 AM Josph Macho, MD [facility] [phone] None     Medication List  As of 09/27/2011  9:26 AM   TAKE these medications         aspirin 325 MG tablet   Take 325 mg by mouth daily.      atorvastatin 40 MG tablet   Commonly known as: LIPITOR   Take 40 mg by mouth Daily.      citalopram 40 MG tablet   Commonly known as: CELEXA   Take 40 mg by mouth daily.      guaiFENesin 600 MG 12 hr tablet   Commonly known as: MUCINEX   Take 1 tablet (600 mg total) by mouth 2 (two) times daily.      levothyroxine 75 MCG tablet   Commonly known as: SYNTHROID, LEVOTHROID   Take 75 mcg by mouth daily.      lisinopril-hydrochlorothiazide 10-12.5 MG per tablet   Commonly known as: PRINZIDE,ZESTORETIC   Take 1 tablet by mouth daily.      metoprolol tartrate 12.5 mg Tabs   Commonly known as: LOPRESSOR   Take 0.5 tablets (12.5 mg total) by mouth 2 (two) times daily.      Oxycodone HCl 10 MG Tabs   Take 1 tablet (10 mg total) by mouth every 4 (four) hours as needed.      oxyCODONE 5 MG immediate release tablet   Commonly known as: Oxy IR/ROXICODONE   Take 1 tablet (5 mg total) by mouth every 4 (four) hours as needed.      pramipexole 0.125 MG tablet   Commonly known as: MIRAPEX   Take 0.125-0.25 mg by mouth at bedtime.      Vitamin D 2000 UNITS Caps   Take 2,000 Units by mouth daily.           Follow-up Information    Follow up with  HENDRICKSON,STEVEN C, MD in 2 weeks. (Office will call with your appointment time, CXR prior to appointemnt)    Contact information:   [address] [place] [address] [phone]       Follow up with Josph Macho, MD. Call in 2 weeks. (post op follow up )    Contact information:   [address], [address] [place] [address] [phone]          Signed: Lowella Dandy 09/27/2011, 9:26 AM

## 2011-09-28 MED ORDER — LEVOTHYROXINE SODIUM 125 MCG PO TABS
125.0000 ug | ORAL_TABLET | Freq: Every day | ORAL | Status: DC
  Administered 2011-09-28 – 2011-09-29 (×2): 125 ug via ORAL
  Filled 2011-09-28 (×3): qty 1

## 2011-09-28 NOTE — Progress Notes (Addendum)
[address]            [place] [address]          [phone]     8 Days Post-Op  Procedure(s) (LRB): VIDEO ASSISTED THORACOSCOPY (VATS)/ LOBECTOMY (Left) Subjective: Slowly feeling better, less sputum production  Objective  Telemetry SR  Temp:  [97.4 F (36.3 C)-98.6 F (37 C)] 98.6 F (37 C) (03/30 0414) Pulse Rate:  [76-90] 76  (03/30 0414) Resp:  [18-20] 19  (03/30 0414) BP: (103-125)/(53-65) 120/65 mmHg (03/30 0414) SpO2:  [95 %-97 %] 95 % (03/30 0414)   Intake/Output Summary (Last 24 hours) at 09/28/11 1304 Last data filed at 09/28/11 0944  Gross per 24 hour  Intake    720 ml  Output    800 ml  Net    -80 ml       General appearance: alert, cooperative and no distress Heart: regular rate and rhythm Lungs: mildly diminished in L>R bases Abdomen: benign Wound: incisions healing well  Lab Results:  Basename 09/26/11 0625  NA 136  K 5.2*  CL 99  CO2 32  GLUCOSE 94  BUN 11  CREATININE 0.84  CALCIUM 9.3  MG --  PHOS --   No results found for this basename: AST:2,ALT:2,ALKPHOS:2,BILITOT:2,PROT:2,ALBUMIN:2 in the last 72 hours No results found for this basename: LIPASE:2,AMYLASE:2 in the last 72 hours  Basename 09/26/11 0625  WBC 12.7*  NEUTROABS --  HGB 10.7*  HCT 32.5*  MCV 98.2  PLT 328   No results found for this basename: CKTOTAL:4,CKMB:4,TROPONINI:4 in the last 72 hours No components found with this basename: POCBNP:3 No results found for this basename: DDIMER in the last 72 hours No results found for this basename: HGBA1C in the last 72 hours No results found for this basename: CHOL,HDL,LDLCALC,TRIG,CHOLHDL in the last 72 hours  Basename 09/27/11 1617  TSH 36.244*  T4TOTAL --  T3FREE --  THYROIDAB --   No results found for this basename: VITAMINB12,FOLATE,FERRITIN,TIBC,IRON,RETICCTPCT in the last 72 hours  Medications: Scheduled    . acetaminophen  1,000 mg Oral Q6H  . aspirin  325 mg Oral  Daily  . atorvastatin  40 mg Oral q1800  . bisacodyl  10 mg Oral Daily  . citalopram  40 mg Oral Daily  . guaiFENesin  600 mg Oral BID  . hydrochlorothiazide  12.5 mg Oral Daily  . levothyroxine  75 mcg Oral QAC breakfast  . lisinopril  10 mg Oral Daily  . metoprolol tartrate  12.5 mg Oral BID  . polyethylene glycol  17 g Oral Daily  . pramipexole  0.125 mg Oral QHS  . sodium chloride  10-40 mL Intracatheter Q12H  . DISCONTD: metoprolol tartrate  12.5 mg Oral BID     Radiology/Studies:  Dg Chest 2 View  09/27/2011  *RADIOLOGY REPORT*  Clinical Data: Shortness of breath, cough, post chest tube removal  CHEST - 2 VIEW  Comparison: 09/26/2011  Findings: Postsurgical changes in the left hemithorax with stable lucency in the left lung apex, likely reflecting a small pneumothorax.  Underlying chronic interstitial markings/emphysematous changes.  The heart is normal in size.  Vascular stent in the left upper mediastinum.  Surgical clips in the chest wall / axilla.  Skin staples overlying the left lower chest wall.  Mild degenerative changes of the visualized thoracolumbar spine.  IMPRESSION: Postsurgical changes in the left hemithorax.  Stable  small left apical pneumothorax.  Original Report Authenticated By: Charline Bills, M.D.   Dg Chest Port 1 View  09/26/2011  *RADIOLOGY REPORT*  Clinical Data: Shortness of breath following right central venous catheter removal. Recent left lobectomy/VATS.  PORTABLE CHEST - 1 VIEW  Comparison: 09/26/2011 and prior chest radiographs  Findings: A right IJ central venous catheter has been removed. There is no evidence of right pneumothorax.  A left apical pneumothorax is unchanged, approximately 5-10%. Left thoracic postoperative changes are again identified with interstitial prominence in the remaining left lung. A left subclavian vascular stent left axillary surgical clips are again noted. No evidence of pleural effusion. The cardiomediastinal silhouette is  stable.  IMPRESSION: Right IJ central venous catheter removal - no evidence of right pneumothorax.  Stable 5-10% left apical pneumothorax and left thoracic postoperative changes.  Original Report Authenticated By: Rosendo Gros, M.D.   Dg Chest Port 1 View  09/26/2011  *RADIOLOGY REPORT*  Clinical Data: Status post left chest tube removal.  PORTABLE CHEST - 1 VIEW  Comparison: Earlier today.  Findings: Interval removal of the left chest tube.  No significant change in an approximately 5-10% left apical pneumothorax.  Stable left subclavian artery stent and left axillary surgical clips. Left breast skin clips are again demonstrated.  Mildly increased ill-defined opacity at the right lung base.  No significant change in left lung density and old, healed fractures on the left.  The left hemidiaphragm remains mildly elevated and surgical staples are again demonstrated in the left lower lung zone.  IMPRESSION:  1.  Stable 5-10% left apical pneumothorax following left chest tube removal. 2.  Mild right basilar atelectasis, increased. 3.  No significant change in left lung atelectasis/scarring.  Original Report Authenticated By: Darrol Angel, M.D.    INR: Will add last result for INR, ABG once components are confirmed Will add last 4 CBG results once components are confirmed  Assessment/Plan: S/P Procedure(s) (LRB): VIDEO ASSISTED THORACOSCOPY (VATS)/ LOBECTOMY (Left)  1. Conts to progress, push pulm toilet/RX 2. May need home O2 3 possible home in am 4. TSH markedly elevated, increase thyroid supplement  LOS: 11 days    GOLD,[name] 3/30/20131:04 PM     Agree with above.  If not off oxygen she should go home tomorrow with home O2.

## 2011-09-29 MED ORDER — LEVOTHYROXINE SODIUM 125 MCG PO TABS: 125.0000 ug | ORAL_TABLET | Freq: Every day | ORAL | Status: DC

## 2011-09-29 NOTE — Discharge Summary (Signed)
                   [address]            [place] [address]          [phone]   Addendum:  The stomach continues to progress nicely. However, it does appear that she will require home oxygen which has been arranged. She will remain on 2 L nasal cannula, continuously. Additionally her thyroid function appears to be poorly controlled on her current dose of Synthroid. Her TSH is elevated to 36.2. I have increased her dosing from 75 mcg daily to 125 and she will need followup with her primary physician. There are no other significant changes to her previously dictated summary.

## 2011-09-29 NOTE — Progress Notes (Signed)
                   [address]            [place] [address]          [phone]     9 Days Post-Op  Procedure(s) (LRB): VIDEO ASSISTED THORACOSCOPY (VATS)/ LOBECTOMY (Left) Subjective: Feels ok, slowly getting stronger  Objective  Telemetry NSR  Temp:  [97 F (36.1 C)-98.9 F (37.2 C)] 98.4 F (36.9 C) (03/31 0500) Pulse Rate:  [71-77] 74  (03/31 0500) Resp:  [18] 18  (03/31 0500) BP: (105-152)/(52-70) 114/65 mmHg (03/31 0500) SpO2:  [97 %-99 %] 99 % (03/30 2015)   Intake/Output Summary (Last 24 hours) at 09/29/11 0806 Last data filed at 09/29/11 0804  Gross per 24 hour  Intake    960 ml  Output    750 ml  Net    210 ml       Physical Examination: General appearance - alert, well appearing, and in no distress Chest - mildly diminished in bases Heart - normal rate, regular rhythm, normal S1, S2, no murmurs, rubs, clicks or gallops Abdomen - soft, nontender, nondistended, no masses or organomegaly Skin - incision healing well  Lab Results: No results found for this basename: NA:2,K:2,CL:2,CO2:2,GLUCOSE:2,BUN:2,CREATININE:2,CALCIUM:2,MG:2,PHOS:2 in the last 72 hours No results found for this basename: AST:2,ALT:2,ALKPHOS:2,BILITOT:2,PROT:2,ALBUMIN:2 in the last 72 hours No results found for this basename: LIPASE:2,AMYLASE:2 in the last 72 hours No results found for this basename: WBC:2,NEUTROABS:2,HGB:2,HCT:2,MCV:2,PLT:2 in the last 72 hours No results found for this basename: CKTOTAL:4,CKMB:4,TROPONINI:4 in the last 72 hours No components found with this basename: POCBNP:3 No results found for this basename: DDIMER in the last 72 hours No results found for this basename: HGBA1C in the last 72 hours No results found for this basename: CHOL,HDL,LDLCALC,TRIG,CHOLHDL in the last 72 hours  Basename 09/27/11 1617  TSH 36.244*  T4TOTAL --  T3FREE --  THYROIDAB --   No results found for this basename: VITAMINB12,FOLATE,FERRITIN,TIBC,IRON,RETICCTPCT  in the last 72 hours  Medications: Scheduled    . acetaminophen  1,000 mg Oral Q6H  . aspirin  325 mg Oral Daily  . atorvastatin  40 mg Oral q1800  . bisacodyl  10 mg Oral Daily  . citalopram  40 mg Oral Daily  . guaiFENesin  600 mg Oral BID  . hydrochlorothiazide  12.5 mg Oral Daily  . levothyroxine  125 mcg Oral QAC breakfast  . lisinopril  10 mg Oral Daily  . metoprolol tartrate  12.5 mg Oral BID  . polyethylene glycol  17 g Oral Daily  . pramipexole  0.125 mg Oral QHS  . sodium chloride  10-40 mL Intracatheter Q12H  . DISCONTD: levothyroxine  75 mcg Oral QAC breakfast     Radiology/Studies:  No results found.  INR: Will add last result for INR, ABG once components are confirmed Will add last 4 CBG results once components are confirmed  Assessment/Plan: S/P Procedure(s) (LRB): VIDEO ASSISTED THORACOSCOPY (VATS)/ LOBECTOMY (Left)   stable for d/c on home O2  LOS: 12 days    Nikol Lemar E 3/31/20138:06 AM

## 2011-09-29 NOTE — Progress Notes (Signed)
09/29/2011 11:01 AM Nursing Note Discharge avs form, Rx, follow up appointments, incision care, when to call Md and medications already taken today and those due this evening given and explained to patient and family. Questions and concerns addressed. Use of home oxygen also reviewed. D/c iv line. D/c tele. D/c home per orders.  Gavino Fouch, Blanchard Kelch

## 2011-09-30 ENCOUNTER — Other Ambulatory Visit: Payer: Self-pay | Admitting: Thoracic Surgery (Cardiothoracic Vascular Surgery)

## 2011-09-30 DIAGNOSIS — D381 Neoplasm of uncertain behavior of trachea, bronchus and lung: Secondary | ICD-10-CM

## 2011-09-30 NOTE — Progress Notes (Signed)
Kwynn Schlotter, PTA [phone] 09/30/2011  

## 2011-10-03 ENCOUNTER — Other Ambulatory Visit: Payer: Medicare Other | Admitting: Lab

## 2011-10-03 ENCOUNTER — Ambulatory Visit: Payer: Medicare Other | Admitting: Hematology & Oncology

## 2011-10-04 ENCOUNTER — Ambulatory Visit (INDEPENDENT_AMBULATORY_CARE_PROVIDER_SITE_OTHER): Payer: Self-pay

## 2011-10-04 VITALS — BP 101/59 | HR 59 | Resp 18

## 2011-10-04 DIAGNOSIS — D381 Neoplasm of uncertain behavior of trachea, bronchus and lung: Secondary | ICD-10-CM

## 2011-10-04 DIAGNOSIS — Z4802 Encounter for removal of sutures: Secondary | ICD-10-CM

## 2011-10-04 DIAGNOSIS — G8918 Other acute postprocedural pain: Secondary | ICD-10-CM

## 2011-10-04 MED ORDER — HYDROCODONE-ACETAMINOPHEN 7.5-750 MG PO TABS: 1.0000 | ORAL_TABLET | Freq: Four times a day (QID) | ORAL | Status: DC | PRN

## 2011-10-04 NOTE — Patient Instructions (Signed)
Katelyn [name] will return on Monday 10/07/11 for wound check with PA. at 4pm.

## 2011-10-04 NOTE — Progress Notes (Signed)
Ms.Cafaro in office to have sutures and staples removal post Left  (VATS) and wedge resection on 09/20/11 by Dr. Dorris Fetch.  16 staples removed without any problems. Edges closed no redness or drainage noted healing well. Sutures x2 removed from 2 chest tube site total of 4 sutures. LL chest tube site edges complete closed healing well. No redness or drainage noted. Left upper chest tube site edges open with appox 1 inch tunneling  with small amount of serous drainage noted.No redness or any signs of infection at present time. Site pack with (1) 2x2 wet to dry normal saline sterile gauze. Patient denied any pain at this time. Tolerated procedure well. Instructions given to  change wet to dry normal saline dsg once a day  to Ms. Notz's granddaughter who is a Engineer, civil (consulting) and she voiced understanding. Patient will return on 10/07/11 for wound check.  Haze Boyden LPN

## 2011-10-07 ENCOUNTER — Ambulatory Visit (INDEPENDENT_AMBULATORY_CARE_PROVIDER_SITE_OTHER): Payer: Self-pay | Admitting: Physician Assistant

## 2011-10-07 VITALS — BP 125/64 | HR 68 | Temp 97.3°F | Resp 20 | Ht 67.0 in | Wt 167.0 lb

## 2011-10-07 DIAGNOSIS — Z4800 Encounter for change or removal of nonsurgical wound dressing: Secondary | ICD-10-CM

## 2011-10-07 DIAGNOSIS — R911 Solitary pulmonary nodule: Secondary | ICD-10-CM

## 2011-10-07 DIAGNOSIS — Z902 Acquired absence of lung [part of]: Secondary | ICD-10-CM

## 2011-10-07 DIAGNOSIS — C3490 Malignant neoplasm of unspecified part of unspecified bronchus or lung: Secondary | ICD-10-CM

## 2011-10-07 DIAGNOSIS — IMO0001 Reserved for inherently not codable concepts without codable children: Secondary | ICD-10-CM

## 2011-10-07 DIAGNOSIS — Z5189 Encounter for other specified aftercare: Secondary | ICD-10-CM

## 2011-10-07 DIAGNOSIS — Z9889 Other specified postprocedural states: Secondary | ICD-10-CM

## 2011-10-07 NOTE — Progress Notes (Signed)
  HPI:  Katelyn [name] presents today for wound check.  She is S/P Left VATS performed on 09/17/2011.  She presented to clinic on 10/04/2011 to have sutures removed from her surgical incisions.  At that time the posterior chest tube site was open with no purulent drainage present.  The patient was instructed to pack the wound wet to dry daily and follow up today for a wound check.  The patient is doing okay overall.  She states that she is having a lot of pain which the pain medication she was prescribed is not lasting long enough for her to take every 6 hours.  The patient denies purulent drainage from her chest tube sites. FInally the patient complains of anxiety and insomnia.   Current Outpatient Prescriptions  Medication Sig Dispense Refill  . aspirin 325 MG tablet Take 325 mg by mouth daily.      Marland Kitchen atorvastatin (LIPITOR) 40 MG tablet Take 40 mg by mouth Daily.      . Cholecalciferol (VITAMIN D) 2000 UNITS CAPS Take 2,000 Units by mouth daily.       . citalopram (CELEXA) 40 MG tablet Take 40 mg by mouth daily.        Marland Kitchen guaiFENesin (MUCINEX) 600 MG 12 hr tablet Take 1 tablet (600 mg total) by mouth 2 (two) times daily.  60 tablet  0  . HYDROcodone-acetaminophen (VICODIN ES) 7.5-750 MG per tablet Take 1 tablet by mouth every 6 (six) hours as needed for pain.  40 tablet  0  . levothyroxine (SYNTHROID, LEVOTHROID) 125 MCG tablet Take 1 tablet (125 mcg total) by mouth daily before breakfast.  30 tablet  1  . lisinopril-hydrochlorothiazide (PRINZIDE,ZESTORETIC) 10-12.5 MG per tablet Take 1 tablet by mouth daily.        . metoprolol tartrate (LOPRESSOR) 12.5 mg TABS Take 0.5 tablets (12.5 mg total) by mouth 2 (two) times daily.  30 tablet  1  . pramipexole (MIRAPEX) 0.125 MG tablet Take 0.125-0.25 mg by mouth at bedtime.         Physical Exam:  Gen:  Patient appears ill Lungs: CTA bilaterally Heart: RRR Skin: posterior chest tube site open, with pink granulation tissue present, fresh bleeding around  skin edges, wound appears clean, no purulent drainage present   Impression:  Patient's chest tube wound is stable.  The wound appears clean with no acute infectious process present  Plan:  1. Continue to pack chest tube site daily with wet to dry dressing 2. Pain control- encouraged patient to take Ibuprofen 800mg  every 8 hours as needed for pain.  Also instructed patient that she can decrease the frequency of her prescribed pain medication to every 4-6 hours as needed for pain.  3. Insomnia/Anxiety- instructed patient that she can try Benadryl nightly, I explained to the patient that I typically do not prescribe anxiety medication or sleeping pills, because I do not monitor the patient on a regular basis.  I explained to the patient that if these are medications she is wanting prescribed she would need to follow up with her PCP

## 2011-10-15 ENCOUNTER — Other Ambulatory Visit: Payer: Self-pay | Admitting: *Deleted

## 2011-10-15 ENCOUNTER — Other Ambulatory Visit: Payer: Self-pay | Admitting: Thoracic Surgery (Cardiothoracic Vascular Surgery)

## 2011-10-15 DIAGNOSIS — G8918 Other acute postprocedural pain: Secondary | ICD-10-CM

## 2011-10-15 DIAGNOSIS — D381 Neoplasm of uncertain behavior of trachea, bronchus and lung: Secondary | ICD-10-CM

## 2011-10-15 MED ORDER — HYDROCODONE-ACETAMINOPHEN 7.5-750 MG PO TABS: 1.0000 | ORAL_TABLET | Freq: Four times a day (QID) | ORAL | Status: DC | PRN

## 2011-10-16 ENCOUNTER — Ambulatory Visit: Payer: Medicare Other | Admitting: Thoracic Surgery (Cardiothoracic Vascular Surgery)

## 2011-10-17 ENCOUNTER — Ambulatory Visit: Payer: Self-pay | Admitting: Thoracic Surgery (Cardiothoracic Vascular Surgery)

## 2011-10-23 ENCOUNTER — Ambulatory Visit (INDEPENDENT_AMBULATORY_CARE_PROVIDER_SITE_OTHER): Payer: Self-pay | Admitting: Thoracic Surgery (Cardiothoracic Vascular Surgery)

## 2011-10-23 ENCOUNTER — Encounter: Payer: Self-pay | Admitting: Thoracic Surgery (Cardiothoracic Vascular Surgery)

## 2011-10-23 ENCOUNTER — Ambulatory Visit
Admission: RE | Admit: 2011-10-23 | Discharge: 2011-10-23 | Disposition: A | Discharge status: Home / Self Care | Payer: Medicare Other | Source: Ambulatory Visit | Attending: Thoracic Surgery (Cardiothoracic Vascular Surgery) | Admitting: Thoracic Surgery (Cardiothoracic Vascular Surgery)

## 2011-10-23 VITALS — BP 146/79 | HR 74 | Resp 20 | Ht 67.0 in | Wt 174.0 lb

## 2011-10-23 DIAGNOSIS — C3490 Malignant neoplasm of unspecified part of unspecified bronchus or lung: Secondary | ICD-10-CM

## 2011-10-23 DIAGNOSIS — T8131XA Disruption of external operation (surgical) wound, not elsewhere classified, initial encounter: Secondary | ICD-10-CM

## 2011-10-23 DIAGNOSIS — D381 Neoplasm of uncertain behavior of trachea, bronchus and lung: Secondary | ICD-10-CM

## 2011-10-23 DIAGNOSIS — Z9889 Other specified postprocedural states: Secondary | ICD-10-CM

## 2011-10-23 NOTE — Progress Notes (Signed)
HPI:  Katelyn [name] returns for a scheduled postoperative followup visit.  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 A PET CT showed 2 hypermetabolic nodules in her left upper lobe. She was advised to undergo surgical resection. She underwent wedge resection. On frozen section it was felt to just be radiation changes, although cancer could not be ruled out. Subsequently the final pathology showed squamous cell carcinoma with multiple foci. She underwent a completion left upper lobectomy on 09/20/2011. She had a slow recovery and was eventually discharged home on oxygen.  Since discharge she's continued to progress slowly. She's had difficulty with sleeping, poor appetite, incisional pain, weak voice and general fatigue. She says she has noticed some slight improvement over the past week, but is frustrated by what she sees as a lack of progress.  Past Medical History  Diagnosis Date  . 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 Pulmonary nodule, left 08/26/2011  . Hypertension   . Restless leg syndrome   . Peripheral vascular disease   . Carotid artery stenosis     bilateral  . Glomus tumor      right  . Shortness of breath     exertional  . Hypothyroidism   . Cancer     breast, bilateral s/p mast and chemo  . Shoulder pain, left     due to vascular dz  . Left hip pain     when ambulatory  . Episodic low back pain   . Depression     stress related  . Hard of hearing      Current Outpatient Prescriptions  Medication Sig Dispense Refill  . ALPRAZolam (XANAX) 0.5 MG tablet Take 0.5 mg by mouth 3 (three) times daily as needed.       Marland Kitchen aspirin 325 MG tablet Take 325 mg by mouth daily.      Marland Kitchen atorvastatin (LIPITOR) 40 MG tablet Take 40 mg by mouth Daily.      . Cholecalciferol (VITAMIN D) 2000 UNITS CAPS Take 2,000 Units by mouth daily.       . citalopram (CELEXA) 40  MG tablet Take 40 mg by mouth daily.        Marland Kitchen guaiFENesin (MUCINEX) 600 MG 12 hr tablet Take 1 tablet (600 mg total) by mouth 2 (two) times daily.  60 tablet  0  . HYDROcodone-acetaminophen (VICODIN ES) 7.5-750 MG per tablet Take 1 tablet by mouth every 6 (six) hours as needed for pain.  40 tablet  0  . levothyroxine (SYNTHROID, LEVOTHROID) 125 MCG tablet Take 1 tablet (125 mcg total) by mouth daily before breakfast.  30 tablet  1  . lisinopril-hydrochlorothiazide (PRINZIDE,ZESTORETIC) 10-12.5 MG per tablet Take 1 tablet by mouth daily.        . metoprolol tartrate (LOPRESSOR) 12.5 mg TABS Take 0.5 tablets (12.5 mg total) by mouth 2 (two) times daily.  30 tablet  1  . pramipexole (MIRAPEX) 0.125 MG tablet Take 0.125-0.25 mg by mouth at bedtime.       . traZODone (DESYREL) 50 MG tablet Take 50 mg by mouth at bedtime.         Physical Exam BP 146/79  Pulse 74  Resp 20  Ht 5\' 7"  (1.702 m)  Wt 174 lb (78.926 kg)  BMI 27.25 kg/m2  SpO2 93% Gen. somewhat flat affect Lungs diminished breath sounds left base  Incision clean dry and intact 1 chest tube site open and granulating no sign of infection  Diagnostic Tests:  Chest x-ray shows postoperative changes with a small apical space  Impression: 66 year old woman who underwent a wedge resection and then had to have a redo VATS completion lobectomy on 09/20/2011. Her recovery has been slow, but this is not all surprising. All of her complaints or common after major thoracic operation even when done minimally invasively. I advised her that she is very early in the recovery process and that she should notice some significant improvement over the next 2-3 weeks. I encouraged her to eat anything that tasted good and not worry about any type of restricted diet at the present time.  I am concerned about her weak voice which could be indicative of a left recurrent nerve injury. If that does not improve our next followup visit, we will consider an ENT  consult  I did give her a prescription for Vicodin 5/325 one to 2 tablets up to 4 times daily as needed for pain, dispense 40 tablets. No refills but she may call the office if she needs additional pain medication.  Plan: She needs to see Dr. Myna Hidalgo to discuss whether chemotherapy would be appropriate.  I will plan to see her back in about 3 weeks with another chest x-ray to check on her progress.

## 2011-10-25 ENCOUNTER — Ambulatory Visit (HOSPITAL_BASED_OUTPATIENT_CLINIC_OR_DEPARTMENT_OTHER): Payer: Medicare Other | Admitting: Hematology & Oncology

## 2011-10-25 ENCOUNTER — Other Ambulatory Visit (HOSPITAL_BASED_OUTPATIENT_CLINIC_OR_DEPARTMENT_OTHER): Payer: Medicare Other | Admitting: Lab

## 2011-10-25 VITALS — BP 151/74 | HR 74 | Temp 96.7°F | Ht 67.0 in | Wt 169.0 lb

## 2011-10-25 DIAGNOSIS — C3490 Malignant neoplasm of unspecified part of unspecified bronchus or lung: Secondary | ICD-10-CM

## 2011-10-25 DIAGNOSIS — C3410 Malignant neoplasm of upper lobe, unspecified bronchus or lung: Secondary | ICD-10-CM

## 2011-10-25 DIAGNOSIS — B370 Candidal stomatitis: Secondary | ICD-10-CM

## 2011-10-25 LAB — CBC WITH DIFFERENTIAL (CANCER CENTER ONLY)
BASO#: 0 10*3/uL (ref 0.0–0.2)
EOS%: 2.2 % (ref 0.0–7.0)
HCT: 33.7 % — ABNORMAL LOW (ref 34.8–46.6)
HGB: 11 g/dL — ABNORMAL LOW (ref 11.6–15.9)
MCH: 32.5 pg (ref 26.0–34.0)
MCHC: 32.6 g/dL (ref 32.0–36.0)
MCV: 100 fL (ref 81–101)
MONO%: 14.5 % — ABNORMAL HIGH (ref 0.0–13.0)
Platelets: 219 10*3/uL (ref 145–400)
RBC: 3.38 10*6/uL — ABNORMAL LOW (ref 3.70–5.32)
RDW: 14.3 % (ref 11.1–15.7)
WBC: 9 10*3/uL (ref 3.9–10.0)

## 2011-10-25 MED ORDER — FLUCONAZOLE 100 MG PO TABS: ORAL_TABLET | ORAL | Status: DC

## 2011-10-26 LAB — COMPREHENSIVE METABOLIC PANEL
AST: 16 U/L (ref 0–37)
Albumin: 3.5 g/dL (ref 3.5–5.2)
Alkaline Phosphatase: 84 U/L (ref 39–117)
BUN: 10 mg/dL (ref 6–23)
Potassium: 3.9 mEq/L (ref 3.5–5.3)
Sodium: 140 mEq/L (ref 135–145)

## 2011-10-28 NOTE — Progress Notes (Signed)
This office note has been dictated.

## 2011-10-28 NOTE — Progress Notes (Signed)
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  CURRENT THERAPY:  Status post resection.  INTERIM HISTORY:  [name] comes in for followup.  She did have surgery to remove the lung nodule in the left upper lobe.  However, when Dr. Orson Aloe went in he found I think multiple nodules.  He went ahead and did a left upper lobe resection.  From the pathology report ([id number])  this showed multiple tumor foci.  The largest measured 1.3 cm. This was invasive squamous cell carcinoma.  It was mildly differentiated.  All margins were negative.  There is no lymphovascular space invasion.  There is no visceropleural involvement.  She had no lymph nodes that tested positive.  I would probably stage this as IIB (T3 N0 M0) squamous cell carcinoma.  She had a tough postoperative course.  She is still recovering.  She is still somewhat frail.  Her performance status currently is ECOG 3.  Her appetite is slowly coming back.  She has had some postop pain.  She had chest tubes in for a while.  She has had no cough.  There has been no bleeding.  She has had no nausea, vomiting.  There has been no change in bowel or bladder habits. She has had some slight leg swelling.  PHYSICAL EXAMINATION:  General:  This is a somewhat frail appearing white female in no obvious distress.  Vital signs:  96.7, pulse 74, respiratory rate 18, blood pressure 151/74.  Weight is 169.  Head and head:  Exam shows a normocephalic, atraumatic skull.  There are no ocular or oral lesions.  There are no palpable cervical or supraclavicular lymph nodes.  Lungs:  With some decreased breath sounds bilaterally.  She has no wheezes.  Cardiac:  Regular rate and rhythm with a normal S1, S2.  There are no murmurs, rubs or bruits.  Abdomen: Soft with good bowel sounds.  There is no palpable abdominal mass. There is no fluid wave.  No palpable hepatosplenomegaly.  Extremities: Shows no clubbing,  cyanosis or edema.  Muscle strength is 4/5 bilaterally.  Neurological:  Exam shows no focal neurological deficits. Her thoracotomy scar in the left lateral chest wall is healing.  LABORATORY STUDIES:  White cell count 9, hemoglobin 11, hematocrit 33.7, platelet count 219.  Her sodium 140, albumin 3.5.  LFTs are normal.  IMPRESSION:  [name] is a 66 year old white female with what I would consider be stage IIB squamous cell carcinoma of the left lung.  She did undergo resection.  She is node negative.  I realize that in situations like this, adjuvant chemotherapy would be offered.  However, I just do not believe [name] would tolerate platinum based chemotherapy in the adjuvant setting.  She just is not strong enough, in my mind.  She had a very tough postop recovery. She had her initial surgery I think back in mid March.  She had a tough postop recovery.  I am glad that all margins were negative.  I am mostly satisfied with the fact that lymph nodes were negative.  She had small lesions in the left upper lung.  I feel confident that she has a significant chance of cure.  This is with surgery alone.  Again, I just do not see [name] being able to take adjuvant chemotherapy that is really necessary for non-small cell lung cancer.  I do want to follow her along.  I think she will need to  have a CT scan done in about 3 months or so.  I think we do need to follow this closely.  I will plan to see her back after she has her scan done.  We will get this set up for I think July.    ______________________________ Josph Macho, M.D. PRE/MEDQ  D:  10/28/2011  T:  10/28/2011  Job:  [id number]

## 2011-11-01 ENCOUNTER — Telehealth: Payer: Self-pay | Admitting: Hematology & Oncology

## 2011-11-01 NOTE — Telephone Encounter (Signed)
Pt aware of 7-22 lab and CT times and locations. She is also aware of 8-5 MD appointment

## 2011-11-08 ENCOUNTER — Other Ambulatory Visit: Payer: Self-pay | Admitting: Thoracic Surgery (Cardiothoracic Vascular Surgery)

## 2011-11-08 DIAGNOSIS — C3410 Malignant neoplasm of upper lobe, unspecified bronchus or lung: Secondary | ICD-10-CM

## 2011-11-12 ENCOUNTER — Other Ambulatory Visit: Payer: Self-pay | Admitting: *Deleted

## 2011-11-12 ENCOUNTER — Ambulatory Visit
Admission: RE | Admit: 2011-11-12 | Discharge: 2011-11-12 | Disposition: A | Discharge status: Home / Self Care | Payer: Medicare Other | Source: Ambulatory Visit | Attending: Thoracic Surgery (Cardiothoracic Vascular Surgery) | Admitting: Thoracic Surgery (Cardiothoracic Vascular Surgery)

## 2011-11-12 ENCOUNTER — Ambulatory Visit (INDEPENDENT_AMBULATORY_CARE_PROVIDER_SITE_OTHER): Payer: Self-pay | Admitting: Thoracic Surgery (Cardiothoracic Vascular Surgery)

## 2011-11-12 VITALS — BP 142/67 | HR 64 | Resp 18 | Ht 67.0 in | Wt 165.0 lb

## 2011-11-12 DIAGNOSIS — G8918 Other acute postprocedural pain: Secondary | ICD-10-CM

## 2011-11-12 DIAGNOSIS — Z4800 Encounter for change or removal of nonsurgical wound dressing: Secondary | ICD-10-CM

## 2011-11-12 DIAGNOSIS — C3410 Malignant neoplasm of upper lobe, unspecified bronchus or lung: Secondary | ICD-10-CM

## 2011-11-12 DIAGNOSIS — Z09 Encounter for follow-up examination after completed treatment for conditions other than malignant neoplasm: Secondary | ICD-10-CM

## 2011-11-12 DIAGNOSIS — IMO0001 Reserved for inherently not codable concepts without codable children: Secondary | ICD-10-CM

## 2011-11-12 MED ORDER — HYDROCODONE-ACETAMINOPHEN 5-325 MG PO TABS: 1.0000 | ORAL_TABLET | Freq: Four times a day (QID) | ORAL | Status: DC | PRN

## 2011-11-12 NOTE — Progress Notes (Signed)
HPI:   Katelyn [name] returns for scheduled postoperative followup visit. She underwent wedge resection of the left upper lobe in March. On frozen section it appeared to be just radiation changes. However on permanent section it was revealed that she had 2 foci of squamous cell carcinoma. She went back for a completion lobectomy on March 23. Postoperative course was relatively slow and she had a relatively prolonged air leak. She also has had difficulty with hoarseness..  She says that her voice intermittently is better, but still is weak overall. She's not had any difficulty with her breathing. He doesn't like she may be having some mild aspiration with coughing while drinking liquids. She does still have some incisional pain. She is packing her chest tube site.  Past Medical History  Diagnosis Date  . 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 Pulmonary nodule, left 08/26/2011  . Hypertension   . Restless leg syndrome   . Peripheral vascular disease   . Carotid artery stenosis     bilateral  . Glomus tumor      right  . Shortness of breath     exertional  . Hypothyroidism   . Cancer     breast, bilateral s/p mast and chemo  . Shoulder pain, left     due to vascular dz  . Left hip pain     when ambulatory  . Episodic low back pain   . Depression     stress related  . Hard of hearing     Current Outpatient Prescriptions  Medication Sig Dispense Refill  . ALPRAZolam (XANAX) 0.5 MG tablet Take 0.25 mg by mouth 1 day or 1 dose.       Marland Kitchen aspirin 325 MG tablet Take 325 mg by mouth daily.      Marland Kitchen atorvastatin (LIPITOR) 40 MG tablet Take 40 mg by mouth Daily.      . Cholecalciferol (VITAMIN D) 2000 UNITS CAPS Take 2,000 Units by mouth daily.       . citalopram (CELEXA) 40 MG tablet Take 40 mg by mouth daily.        Marland Kitchen HYDROcodone-acetaminophen (NORCO) 5-325 MG per tablet Take 1 tablet by mouth every 6 (six) hours as needed. One or two tablets every 6 hrs prn      . levothyroxine (SYNTHROID,  LEVOTHROID) 125 MCG tablet Take 1 tablet (125 mcg total) by mouth daily before breakfast.  30 tablet  1  . lisinopril-hydrochlorothiazide (PRINZIDE,ZESTORETIC) 10-12.5 MG per tablet Take 1 tablet by mouth daily.        . metoprolol tartrate (LOPRESSOR) 12.5 mg TABS Take 0.5 tablets (12.5 mg total) by mouth 2 (two) times daily.  30 tablet  1  . pramipexole (MIRAPEX) 0.125 MG tablet Take 0.125-0.25 mg by mouth 3 (three) times daily.       . traZODone (DESYREL) 50 MG tablet Take 50 mg by mouth at bedtime. Takes 25 mg at bedtime      . fluconazole (DIFLUCAN) 100 MG tablet         Physical Exam BP 142/67  Pulse 64  Resp 18  Ht 5\' 7"  (1.702 m)  Wt 165 lb (74.844 kg)  BMI 25.84 kg/m2  SpO2 93% Lungs diminished breath sounds left side no rales or wheezing A chest tube site is packed Incision clean dry and intact  Diagnostic Tests: Chest x-ray shows postoperative changes with volume loss on the left side.  Impression: Status post left upper lobectomy on March 23 for 2 foci of  squamous cell carcinoma. She is seen Dr. Arlan Organ, he does not feel that she is a candidate for chemotherapy at this time. He plans to follow her with CT scans on 3 month basis.  Surgical standpoint I think she is doing well at this point. She does still have some hoarseness although feels that this has gotten better. There is some day-to-day variability. 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 As she has shown some improvement we will follow that for now.  Plan: I'll plan to see her back in one month to check on the progress of the chest tube site and also her progress related to the hoarseness with a plain chest x-ray at that time.

## 2011-11-15 ENCOUNTER — Other Ambulatory Visit: Payer: Self-pay | Admitting: Surgical

## 2011-12-04 ENCOUNTER — Other Ambulatory Visit: Payer: Self-pay | Admitting: Thoracic Surgery (Cardiothoracic Vascular Surgery)

## 2011-12-04 DIAGNOSIS — C3410 Malignant neoplasm of upper lobe, unspecified bronchus or lung: Secondary | ICD-10-CM

## 2011-12-10 ENCOUNTER — Encounter: Payer: Self-pay | Admitting: Thoracic Surgery (Cardiothoracic Vascular Surgery)

## 2011-12-11 ENCOUNTER — Ambulatory Visit (INDEPENDENT_AMBULATORY_CARE_PROVIDER_SITE_OTHER): Payer: Self-pay | Admitting: Thoracic Surgery (Cardiothoracic Vascular Surgery)

## 2011-12-11 ENCOUNTER — Ambulatory Visit
Admission: RE | Admit: 2011-12-11 | Discharge: 2011-12-11 | Disposition: A | Discharge status: Home / Self Care | Payer: Medicare Other | Source: Ambulatory Visit | Attending: Thoracic Surgery (Cardiothoracic Vascular Surgery) | Admitting: Thoracic Surgery (Cardiothoracic Vascular Surgery)

## 2011-12-11 ENCOUNTER — Encounter: Payer: Self-pay | Admitting: Thoracic Surgery (Cardiothoracic Vascular Surgery)

## 2011-12-11 VITALS — BP 142/77 | HR 74 | Resp 20 | Ht 67.0 in | Wt 162.0 lb

## 2011-12-11 DIAGNOSIS — C3410 Malignant neoplasm of upper lobe, unspecified bronchus or lung: Secondary | ICD-10-CM

## 2011-12-11 DIAGNOSIS — Z902 Acquired absence of lung [part of]: Secondary | ICD-10-CM

## 2011-12-11 DIAGNOSIS — Z9889 Other specified postprocedural states: Secondary | ICD-10-CM

## 2011-12-11 NOTE — Progress Notes (Signed)
HPI:  Katelyn [name] returns for 3 month followup visit after a left upper lobectomy for 2 separate squamous cell carcinomas. She continues she is home oxygen. She did walk without it yesterday and had some shortness of breath. She continues to have some pain related to her incision has been taking hydrocodone in the morning and at night. She also complains of some left shoulder pain. She is still hoarse.  Past Medical History  Diagnosis Date  . 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 Pulmonary nodule, left 08/26/2011  . Hypertension   . Restless leg syndrome   . Peripheral vascular disease   . Carotid artery stenosis     bilateral  . Glomus tumor      right  . Shortness of breath     exertional  . Hypothyroidism   . Cancer     breast, bilateral s/p mast and chemo  . Shoulder pain, left     due to vascular dz  . Left hip pain     when ambulatory  . Episodic low back pain   . Depression     stress related  . Hard of hearing      Current Outpatient Prescriptions  Medication Sig Dispense Refill  . ALPRAZolam (XANAX) 0.5 MG tablet Take 0.25 mg by mouth 1 day or 1 dose.       Marland Kitchen aspirin 325 MG tablet Take 325 mg by mouth daily.      Marland Kitchen atorvastatin (LIPITOR) 40 MG tablet Take 40 mg by mouth Daily.      . Cholecalciferol (VITAMIN D) 2000 UNITS CAPS Take 2,000 Units by mouth daily.       . citalopram (CELEXA) 40 MG tablet Take 40 mg by mouth daily.        Marland Kitchen HYDROcodone-acetaminophen (NORCO) 5-325 MG per tablet Take 1 tablet by mouth every 6 (six) hours as needed. One or two tablets every 6 hrs prn  40 tablet  0  . levothyroxine (SYNTHROID, LEVOTHROID) 125 MCG tablet Take 1 tablet (125 mcg total) by mouth daily before breakfast.  30 tablet  1  . lisinopril-hydrochlorothiazide (PRINZIDE,ZESTORETIC) 10-12.5 MG per tablet Take 1 tablet by mouth daily.        . metoprolol tartrate (LOPRESSOR) 12.5 mg TABS Take 0.5 tablets (12.5 mg total) by mouth 2 (two) times daily.  30 tablet  1  . pramipexole  (MIRAPEX) 0.125 MG tablet Take 0.125-0.25 mg by mouth 2 (two) times daily.       . traZODone (DESYREL) 50 MG tablet Take 50 mg by mouth at bedtime. Takes 25 mg at bedtime        Physical Exam BP 142/77  Pulse 74  Resp 20  Ht 5\' 7"  (1.702 m)  Wt 162 lb (73.483 kg)  BMI 25.37 kg/m2  SpO58 29% 66 year old female in no acute distress Is wearing nasal cannula oxygen Voice is hoarse and week Lungs diminished breath sounds at left base, otherwise clear No palpable cervical or subclavicular adenopathy  Diagnostic Tests: Chest x-ray shows postoperative changes, no evidence recurrent disease  Impression: 66 year old woman status post resection of left upper lobe which had to separate squamous cell carcinomas, likely related to radiation for treatment of her previous inflammatory breast cancer.  I doubt she still needs oxygen at this point, we'll check her on room air.  I encouraged her to try to wean herself from the hydrocodone. She was under the impression that she could not use Tylenol. I corrected that misconception and told  her that she should not combine the narcotic which has acetaminophen and Tylenol, but she can take Tylenol instead of the narcotic.  She is still hoarse now about 3 months after surgery. 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 I would refer her for ENT evaluation of that problem.  Plan: Prescription for hydrocodone acetaminophen 5/325 1-2 twice daily as needed for pain, 40 tablets no refills.  I will plan to see her back in 3 months with a CT of the chest.  ENT referral to assess vocal cord function.

## 2012-01-17 ENCOUNTER — Other Ambulatory Visit: Payer: Self-pay | Admitting: *Deleted

## 2012-01-17 DIAGNOSIS — C3490 Malignant neoplasm of unspecified part of unspecified bronchus or lung: Secondary | ICD-10-CM

## 2012-01-20 ENCOUNTER — Ambulatory Visit (HOSPITAL_BASED_OUTPATIENT_CLINIC_OR_DEPARTMENT_OTHER)
Admission: RE | Admit: 2012-01-20 | Discharge: 2012-01-20 | Disposition: A | Discharge status: Home / Self Care | Payer: Medicare Other | Source: Ambulatory Visit | Attending: Hematology & Oncology | Admitting: Hematology & Oncology

## 2012-01-20 ENCOUNTER — Other Ambulatory Visit (HOSPITAL_BASED_OUTPATIENT_CLINIC_OR_DEPARTMENT_OTHER): Payer: Medicare Other | Admitting: Lab

## 2012-01-20 DIAGNOSIS — C3410 Malignant neoplasm of upper lobe, unspecified bronchus or lung: Secondary | ICD-10-CM

## 2012-01-20 DIAGNOSIS — C3490 Malignant neoplasm of unspecified part of unspecified bronchus or lung: Secondary | ICD-10-CM

## 2012-01-20 DIAGNOSIS — R0602 Shortness of breath: Secondary | ICD-10-CM | POA: Insufficient documentation

## 2012-01-20 DIAGNOSIS — B370 Candidal stomatitis: Secondary | ICD-10-CM

## 2012-01-20 DIAGNOSIS — R599 Enlarged lymph nodes, unspecified: Secondary | ICD-10-CM | POA: Insufficient documentation

## 2012-01-20 DIAGNOSIS — I10 Essential (primary) hypertension: Secondary | ICD-10-CM | POA: Insufficient documentation

## 2012-01-20 DIAGNOSIS — J438 Other emphysema: Secondary | ICD-10-CM | POA: Insufficient documentation

## 2012-01-20 LAB — CMP (CANCER CENTER ONLY)
Albumin: 3.4 g/dL (ref 3.3–5.5)
Alkaline Phosphatase: 73 U/L (ref 26–84)
BUN, Bld: 11 mg/dL (ref 7–22)
CO2: 30 mEq/L (ref 18–33)
Calcium: 9.6 mg/dL (ref 8.0–10.3)
Glucose, Bld: 105 mg/dL (ref 73–118)
Potassium: 3.4 mEq/L (ref 3.3–4.7)

## 2012-01-20 MED ORDER — IOHEXOL 300 MG/ML  SOLN
80.0000 mL | Freq: Once | INTRAMUSCULAR | Status: AC | PRN
  Administered 2012-01-20: 80 mL via INTRAVENOUS

## 2012-02-03 ENCOUNTER — Ambulatory Visit: Payer: Medicare Other | Admitting: Hematology & Oncology

## 2012-02-04 ENCOUNTER — Ambulatory Visit (HOSPITAL_BASED_OUTPATIENT_CLINIC_OR_DEPARTMENT_OTHER): Payer: Medicare Other | Admitting: Hematology & Oncology

## 2012-02-04 DIAGNOSIS — C3410 Malignant neoplasm of upper lobe, unspecified bronchus or lung: Secondary | ICD-10-CM

## 2012-02-05 ENCOUNTER — Telehealth: Payer: Self-pay | Admitting: Hematology & Oncology

## 2012-02-05 ENCOUNTER — Other Ambulatory Visit: Payer: Self-pay | Admitting: Thoracic Surgery (Cardiothoracic Vascular Surgery)

## 2012-02-05 DIAGNOSIS — D381 Neoplasm of uncertain behavior of trachea, bronchus and lung: Secondary | ICD-10-CM

## 2012-02-05 NOTE — Progress Notes (Signed)
Patient Name : Katelyn [name], Katelyn [name] MR #[id number] DOB: 05-01-1946 Encounter Date: 02/05/2012 Dictated by Eunice Blase, PA-C  Katelyn [name] showed up in our office lobby this morning asking about the results of her CT scan.  Apparently, she had an appointment with Katelyn [name] for yesterday, and didn't realize it.  I briefly reviewed the results of her CT scan of the chest, with contrast, from 01/20/2012, which revealed: #1 loculated left apical pleural fluid collection with thick enhancing pleural margins and infiltration of the adjacent left upper mediastinal fatty tissues.  This could be a post operative or post radiation therapy.  Appearance, but residual tumor in this vicinity is not excluded.  Adjacent volume loss at the left lung apex noted.  Careful followup imaging is recommended.   #2 mildly enlarged lower right paratracheal lymph node. #3 severe emphysema. #4 left upper lobectomy.  I informed.  Katelyn [name], that for the most part her CT scan looked good and our plan is to continue to follow her closely.  Katelyn [name] will go ahead and make an appointment to followup with Katelyn [name].

## 2012-02-05 NOTE — Telephone Encounter (Signed)
Patient appt was sch for yesterday 02/04/12.  She walked into the office today 02/05/12.  PA spoke with her and i resch her missed appt for 02/18/12

## 2012-02-06 ENCOUNTER — Ambulatory Visit: Payer: Medicare Other | Admitting: Hematology & Oncology

## 2012-02-18 ENCOUNTER — Ambulatory Visit (HOSPITAL_BASED_OUTPATIENT_CLINIC_OR_DEPARTMENT_OTHER): Payer: Medicare Other | Admitting: Hematology & Oncology

## 2012-02-18 VITALS — BP 165/59 | HR 64 | Temp 97.8°F | Resp 18 | Ht 67.0 in | Wt 158.0 lb

## 2012-02-18 DIAGNOSIS — C3490 Malignant neoplasm of unspecified part of unspecified bronchus or lung: Secondary | ICD-10-CM

## 2012-02-18 DIAGNOSIS — C3410 Malignant neoplasm of upper lobe, unspecified bronchus or lung: Secondary | ICD-10-CM

## 2012-02-18 NOTE — Progress Notes (Signed)
This office note has been dictated.

## 2012-02-18 NOTE — Progress Notes (Signed)
CC:   Salvatore Decent. Dorris Fetch, M.D. Paulene Floor, M.D.  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  CURRENT THERAPY:  Observation.  INTERIM HISTORY:  Katelyn [name] comes in for followup.  She is going to [facility] this week.  She is going to have some surgery for vocal cord. 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  I guess some type of implant might be placed.  She did have a recent CT scan done.  This did not show any evidence of residual recurrent disease.  Of course, the radiologist cannot be definitive regarding recurrence.  There are some "enhancing" pleural margins on the left.  Again, I would think this would be highly unlikely for this to be recurrence.  She has severe emphysema.  She has a left upper lobectomy.  She has had no cough.  She is not wearing oxygen as much.  There is no abdominal pain.  She has had no leg swelling.  There are no rashes.  PHYSICAL EXAMINATION:  General:  This is a fairly well-developed, well- nourished white female in no obvious distress.  Vital signs:  Show temperature of 97.8, pulse 63, respiratory rate 18, blood pressure 165/59, weight is 158.  Head and neck:  Exam shows a normocephalic, atraumatic skull.  There are no ocular or oral lesions.  She has no adenopathy in her neck.  Lungs:  Clear bilaterally.  There may be some slight decrease over on the left side.  Cardiac:  Regular rate and rhythm with a normal S1 and S2.  There are no murmurs, rubs or bruits. Abdomen:  Soft with good bowel sounds.  There is no palpable abdominal mass.  There is no fluid wave.  There is no palpable hepatosplenomegaly. Back:  No tenderness over the spine, ribs or hips.  Extremities:  Show no clubbing, cyanosis or edema.  Neurological:  Shows no focal neurological deficits.  Skin:  No rash, ecchymosis or petechia.  LABORATORY STUDIES:  Labs not done this visit.  IMPRESSION:  Katelyn [name] is a very nice 66 year old white  female with stage IIB (T3 N0 M0) squamous cell carcinoma of the left lung.  She had multiple nodules within the lung.  All margins were negative.  She had no lymphovascular space invasion.  There is still a risk of recurrence.  We still need to watch her closely.  I think we will go ahead and get her set up with a followup scan in October.  We will plan to see back after her scan is done.    ______________________________ Josph Macho, M.D. PRE/MEDQ  D:  02/18/2012  T:  02/18/2012  Job:  [id number]

## 2012-03-09 ENCOUNTER — Other Ambulatory Visit: Payer: Self-pay | Admitting: Thoracic Surgery (Cardiothoracic Vascular Surgery)

## 2012-03-09 DIAGNOSIS — C3410 Malignant neoplasm of upper lobe, unspecified bronchus or lung: Secondary | ICD-10-CM

## 2012-03-10 ENCOUNTER — Ambulatory Visit: Payer: Medicare Other | Admitting: Thoracic Surgery (Cardiothoracic Vascular Surgery)

## 2012-03-10 ENCOUNTER — Telehealth: Payer: Self-pay | Admitting: Thoracic Surgery (Cardiothoracic Vascular Surgery)

## 2012-03-10 ENCOUNTER — Inpatient Hospital Stay: Admission: RE | Admit: 2012-03-10 | Payer: Medicare Other | Source: Ambulatory Visit

## 2012-03-10 NOTE — Telephone Encounter (Signed)
Katelyn [name] had cancelled her appointment today and did not want to reschedule.  I spoke with her and she said Dr. Myna Hidalgo had done a ct recently and has another one scheduled for November. She wishes just to follow up with him.  I will be happy to see her if I can be of any assistance

## 2012-05-05 ENCOUNTER — Ambulatory Visit (HOSPITAL_BASED_OUTPATIENT_CLINIC_OR_DEPARTMENT_OTHER)
Admission: RE | Admit: 2012-05-05 | Discharge: 2012-05-05 | Disposition: A | Discharge status: Home / Self Care | Payer: Medicare Other | Source: Ambulatory Visit | Attending: Hematology & Oncology | Admitting: Hematology & Oncology

## 2012-05-05 ENCOUNTER — Other Ambulatory Visit (HOSPITAL_BASED_OUTPATIENT_CLINIC_OR_DEPARTMENT_OTHER): Payer: Medicare Other | Admitting: Lab

## 2012-05-05 DIAGNOSIS — C3410 Malignant neoplasm of upper lobe, unspecified bronchus or lung: Secondary | ICD-10-CM

## 2012-05-05 DIAGNOSIS — C3490 Malignant neoplasm of unspecified part of unspecified bronchus or lung: Secondary | ICD-10-CM

## 2012-05-05 DIAGNOSIS — R05 Cough: Secondary | ICD-10-CM | POA: Insufficient documentation

## 2012-05-05 DIAGNOSIS — Z902 Acquired absence of lung [part of]: Secondary | ICD-10-CM | POA: Insufficient documentation

## 2012-05-05 DIAGNOSIS — R0602 Shortness of breath: Secondary | ICD-10-CM | POA: Insufficient documentation

## 2012-05-05 DIAGNOSIS — R059 Cough, unspecified: Secondary | ICD-10-CM | POA: Insufficient documentation

## 2012-05-05 DIAGNOSIS — R918 Other nonspecific abnormal finding of lung field: Secondary | ICD-10-CM | POA: Insufficient documentation

## 2012-05-05 DIAGNOSIS — B370 Candidal stomatitis: Secondary | ICD-10-CM

## 2012-05-05 LAB — CMP (CANCER CENTER ONLY)
AST: 16 U/L (ref 11–38)
Albumin: 3.7 g/dL (ref 3.3–5.5)
Alkaline Phosphatase: 65 U/L (ref 26–84)
BUN, Bld: 8 mg/dL (ref 7–22)
Calcium: 9.8 mg/dL (ref 8.0–10.3)
Chloride: 102 mEq/L (ref 98–108)
Potassium: 4 mEq/L (ref 3.3–4.7)
Sodium: 140 mEq/L (ref 128–145)
Total Protein: 7.5 g/dL (ref 6.4–8.1)

## 2012-05-05 LAB — LACTATE DEHYDROGENASE: LDH: 92 U/L — ABNORMAL LOW (ref 94–250)

## 2012-05-05 LAB — CBC WITH DIFFERENTIAL (CANCER CENTER ONLY)
BASO#: 0 10*3/uL (ref 0.0–0.2)
BASO%: 0.2 % (ref 0.0–2.0)
EOS%: 1 % (ref 0.0–7.0)
HGB: 13.3 g/dL (ref 11.6–15.9)
LYMPH#: 2.9 10*3/uL (ref 0.9–3.3)
MCH: 31.3 pg (ref 26.0–34.0)
MCHC: 33.5 g/dL (ref 32.0–36.0)
MONO%: 8.9 % (ref 0.0–13.0)
NEUT#: 5.9 10*3/uL (ref 1.5–6.5)
Platelets: 217 10*3/uL (ref 145–400)
RDW: 13.8 % (ref 11.1–15.7)

## 2012-05-05 MED ORDER — IOHEXOL 300 MG/ML  SOLN
80.0000 mL | Freq: Once | INTRAMUSCULAR | Status: AC | PRN
  Administered 2012-05-05: 80 mL via INTRAVENOUS

## 2012-05-07 ENCOUNTER — Telehealth: Payer: Self-pay | Admitting: *Deleted

## 2012-05-07 NOTE — Telephone Encounter (Signed)
Message copied by Anselm Jungling on Thu May 07, 2012  2:50 PM ------      Message from: Josph Macho      Created: Tue May 05, 2012  8:34 PM       Call - No evidence of lung cancer!!!  Great job!! Happy Thanksgiving!!! Cindee Lame

## 2012-05-07 NOTE — Telephone Encounter (Signed)
Called patient to let her know that her CT scan showed no evidence of lung cancer per dr. Myna Hidalgo

## 2012-05-13 ENCOUNTER — Ambulatory Visit (HOSPITAL_BASED_OUTPATIENT_CLINIC_OR_DEPARTMENT_OTHER): Payer: Medicare Other | Admitting: Hematology & Oncology

## 2012-05-13 VITALS — BP 142/54 | HR 59 | Temp 97.8°F | Resp 16 | Ht 67.0 in | Wt 159.0 lb

## 2012-05-13 DIAGNOSIS — C3490 Malignant neoplasm of unspecified part of unspecified bronchus or lung: Secondary | ICD-10-CM

## 2012-05-13 DIAGNOSIS — C3410 Malignant neoplasm of upper lobe, unspecified bronchus or lung: Secondary | ICD-10-CM

## 2012-05-13 DIAGNOSIS — R911 Solitary pulmonary nodule: Secondary | ICD-10-CM

## 2012-05-13 NOTE — Progress Notes (Signed)
This office note has been dictated.

## 2012-05-14 NOTE — Progress Notes (Signed)
CC:   Paulene Floor, M.D. Salvatore Decent Dorris Fetch, M.D.  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  CURRENT THERAPY:  Observation.  INTERIM HISTORY:  Ms. Kapfer comes in for a followup.  She seems to be getting stronger.  She is speaking a little bit better.  She did have some vocal cord surgery out at [facility]  We did do a CT scan.  This was done on November 5th.  The CT scan did not show any evidence of recurrent disease.  She does have atherosclerosis in 3 of the coronary vessels.  She has had no increased cough or shortness breath.  She does wear oxygen on occasion.  She is wearing this less, however.  There has been no change in bowel or bladder habits.  There has been no leg swelling.  She has had no fever, sweats, or chills.  PHYSICAL EXAMINATION:  General:  This is a well-developed, well- nourished white female in no obvious distress.  Vital signs:  97.8, pulse 59, respiratory rate 16, blood pressure 142/54.  Weight is 159. Head and neck:  Normocephalic, atraumatic skull.  There are no ocular or oral lesions.  There are no palpable cervical or supraclavicular lymph nodes.  Lungs:  Clear bilaterally.  Cardiac:  Regular rate and rhythm with a normal S1 and S2.  There are no murmurs, rubs, or bruits. Abdomen:  Soft with good bowel sounds.  There is no palpable abdominal mass.  There is no fluid wave.  There is no palpable hepatosplenomegaly. Extremities:  No clubbing, cyanosis, or edema.  Back:  Thoracotomy scar in the left lateral chest wall.  LABORATORY STUDIES:  White cell count is 9.7, hemoglobin 13.3, hematocrit 39.7, platelet count 217.  Prealbumin is 19.2.  Calcium 9.8.  IMPRESSION:  Ms. Arriaga is a 66 year old female with stage IIB squamous cell carcinoma of the left upper lobe.  She is stage IIB by virtue of multiple lung nodules within the same lobe.  She had negative lymph nodes, which in my mind is most important.  For  now, we will continue to follow her along.  Her surgery was back in March of this year.  We will go ahead for another scan in about 4 months.  I think if that next scan looks okay, then maybe we can go to every 6- months scans.  I just want Ms. Vanpelt to try to continue to improve her nutritional intake.  I want to try to be a little bit more active.  I will see Ms. Deeb back next year after her next scan is done.    ______________________________ Josph Macho, M.D. PRE/MEDQ  D:  05/13/2012  T:  05/14/2012  Job:  [id number]

## 2012-08-18 ENCOUNTER — Telehealth: Payer: Self-pay | Admitting: Hematology & Oncology

## 2012-08-18 NOTE — Telephone Encounter (Signed)
Per MD to cx 09/16/12 apt and resch patient to see French Ana.  Apt was resch for 09/17/12.  i called to give patient apt date/time, but there was no answer or voice mail.  Therefore calendar was mailed to patient's home

## 2012-09-08 ENCOUNTER — Other Ambulatory Visit: Payer: Self-pay | Admitting: Medical

## 2012-09-09 ENCOUNTER — Ambulatory Visit (HOSPITAL_BASED_OUTPATIENT_CLINIC_OR_DEPARTMENT_OTHER)
Admission: RE | Admit: 2012-09-09 | Discharge: 2012-09-09 | Disposition: A | Discharge status: Home / Self Care | Payer: Medicare Other | Source: Ambulatory Visit | Attending: Hematology & Oncology | Admitting: Hematology & Oncology

## 2012-09-09 ENCOUNTER — Encounter (HOSPITAL_BASED_OUTPATIENT_CLINIC_OR_DEPARTMENT_OTHER): Payer: Self-pay

## 2012-09-09 ENCOUNTER — Other Ambulatory Visit (HOSPITAL_BASED_OUTPATIENT_CLINIC_OR_DEPARTMENT_OTHER): Payer: Medicare Other | Admitting: Lab

## 2012-09-09 DIAGNOSIS — C3410 Malignant neoplasm of upper lobe, unspecified bronchus or lung: Secondary | ICD-10-CM

## 2012-09-09 DIAGNOSIS — C3490 Malignant neoplasm of unspecified part of unspecified bronchus or lung: Secondary | ICD-10-CM

## 2012-09-09 DIAGNOSIS — J438 Other emphysema: Secondary | ICD-10-CM | POA: Insufficient documentation

## 2012-09-09 DIAGNOSIS — R059 Cough, unspecified: Secondary | ICD-10-CM | POA: Insufficient documentation

## 2012-09-09 DIAGNOSIS — R05 Cough: Secondary | ICD-10-CM | POA: Insufficient documentation

## 2012-09-09 LAB — CBC WITH DIFFERENTIAL (CANCER CENTER ONLY)
BASO%: 0.2 % (ref 0.0–2.0)
EOS%: 1.1 % (ref 0.0–7.0)
Eosinophils Absolute: 0.1 10*3/uL (ref 0.0–0.5)
LYMPH%: 29.5 % (ref 14.0–48.0)
MCH: 31.2 pg (ref 26.0–34.0)
MCHC: 33 g/dL (ref 32.0–36.0)
MCV: 95 fL (ref 81–101)
MONO%: 12.3 % (ref 0.0–13.0)
NEUT#: 5 10*3/uL (ref 1.5–6.5)
Platelets: 198 10*3/uL (ref 145–400)
RBC: 4.55 10*6/uL (ref 3.70–5.32)
RDW: 13.8 % (ref 11.1–15.7)

## 2012-09-09 LAB — CMP (CANCER CENTER ONLY)
ALT(SGPT): 12 U/L (ref 10–47)
Alkaline Phosphatase: 57 U/L (ref 26–84)
Creat: 1.1 mg/dl (ref 0.6–1.2)
Sodium: 142 mEq/L (ref 128–145)
Total Bilirubin: 0.7 mg/dl (ref 0.20–1.60)
Total Protein: 7.8 g/dL (ref 6.4–8.1)

## 2012-09-09 LAB — PREALBUMIN: Prealbumin: 23.9 mg/dL (ref 17.0–34.0)

## 2012-09-09 MED ORDER — IOHEXOL 300 MG/ML  SOLN
100.0000 mL | Freq: Once | INTRAMUSCULAR | Status: AC | PRN
  Administered 2012-09-09: 80 mL via INTRAVENOUS

## 2012-09-11 ENCOUNTER — Telehealth: Payer: Self-pay | Admitting: *Deleted

## 2012-09-11 NOTE — Telephone Encounter (Signed)
Message copied by Anselm Jungling on Fri Sep 11, 2012  2:09 PM ------      Message from: Arlan Organ R      Created: Thu Sep 10, 2012  5:01 PM       Call - No cancer on scans!!!  Cindee Lame ------

## 2012-09-11 NOTE — Telephone Encounter (Signed)
Called patient to let her know that there was no cancer noted on scans per dr. Myna Hidalgo.

## 2012-09-15 ENCOUNTER — Other Ambulatory Visit: Payer: Self-pay | Admitting: Medical

## 2012-09-16 ENCOUNTER — Ambulatory Visit: Payer: Medicare Other | Admitting: Hematology & Oncology

## 2012-09-17 ENCOUNTER — Ambulatory Visit: Payer: Medicare Other | Admitting: Medical

## 2012-09-28 ENCOUNTER — Ambulatory Visit (HOSPITAL_BASED_OUTPATIENT_CLINIC_OR_DEPARTMENT_OTHER): Payer: Medicare Other | Admitting: Medical

## 2012-09-28 VITALS — BP 158/63 | HR 65 | Temp 98.5°F | Resp 16 | Ht 67.0 in | Wt 165.0 lb

## 2012-09-28 DIAGNOSIS — C3410 Malignant neoplasm of upper lobe, unspecified bronchus or lung: Secondary | ICD-10-CM

## 2012-09-28 DIAGNOSIS — R911 Solitary pulmonary nodule: Secondary | ICD-10-CM

## 2012-09-28 DIAGNOSIS — C3492 Malignant neoplasm of unspecified part of left bronchus or lung: Secondary | ICD-10-CM

## 2012-09-28 NOTE — Progress Notes (Signed)
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  CURRENT THERAPY:  Observation.  INTERIM HISTORY: Katelyn [name] presents today for an office followup visit.  Overall, she seems to be doing quite well.  She is eating much better.  She recently had a CT of the chest.  Back on 09/10/2012.  The CT scan revealed stable posttreatment changes in the left hemithorax.  No evidence of recurrent or metastatic carcinoma.  She does have some pulmonary emphysema.  No active lung disease.  Stable cardiomegaly.  She's not reporting any increasing, cough, or shortness of breath.  She states, that she does wear oxygen on occasion, but this has become less and less, as she has not needed it.  She has a good appetite.  She denies any nausea, vomiting, diarrhea, constipation, chest pain, shortness of breath or cough.  She denies any fevers, chills, or night sweats.  She denies any abnormal or obvious, bleeding.  She denies any lower leg swelling.  She denies any headaches, visual changes, or rashes.  Review of Systems: Constitutional:Negative for malaise/fatigue, fever, chills, weight loss, diaphoresis, activity change, appetite change, and unexpected weight change.  HEENT: Negative for double vision, blurred vision, visual loss, ear pain, tinnitus, congestion, rhinorrhea, epistaxis sore throat or sinus disease, oral pain/lesion, tongue soreness Respiratory: Negative for cough, chest tightness, shortness of breath, wheezing and stridor.  Cardiovascular: Negative for chest pain, palpitations, leg swelling, orthopnea, PND, DOE or claudication Gastrointestinal: Negative for nausea, vomiting, abdominal pain, diarrhea, constipation, blood in stool, melena, hematochezia, abdominal distention, anal bleeding, rectal pain, anorexia and hematemesis.  Genitourinary: Negative for dysuria, frequency, hematuria,  Musculoskeletal: Negative for myalgias, back pain, joint swelling, arthralgias and gait  problem.  Skin: Negative for rash, color change, pallor and wound.  Neurological:. Negative for dizziness/light-headedness, tremors, seizures, syncope, facial asymmetry, speech difficulty, weakness, numbness, headaches and paresthesias.  Hematological: Negative for adenopathy. Does not bruise/bleed easily.  Psychiatric/Behavioral:  Negative for depression, no loss of interest in normal activity or change in sleep pattern.   Physical Exam: This is a pleasant, 67 year old, well-developed, well-nourished, white female, in no obvious distress Vitals: Temperature 98.5 degrees, pulse 65, respirations 16, blood pressure 158/63, weight 165 pounds HEENT reveals a normocephalic, atraumatic skull, no scleral icterus, no oral lesions  Neck is supple without any cervical or supraclavicular adenopathy.  Lungs are clear to auscultation bilaterally. There are no wheezes, rales or rhonci Cardiac is regular rate and rhythm with a normal S1 and S2. There are no murmurs, rubs, or bruits.  Abdomen is soft with good bowel sounds, there is no palpable mass. There is no palpable hepatosplenomegaly. There is no palpable fluid wave.  Musculoskeletal no tenderness of the spine, ribs, or hips.  Extremities there are no clubbing, cyanosis, or edema.  Skin no petechia, purpura or ecchymosis Neurologic is nonfocal.  Laboratory Data: White count 8.7, hemoglobin 14.2, hematocrit 43.0, platelets 198,000  Current Outpatient Prescriptions on File Prior to Visit  Medication Sig Dispense Refill  . ALPRAZolam (XANAX) 0.5 MG tablet Take 0.25 mg by mouth 1 day or 1 dose.       Marland Kitchen aspirin 325 MG tablet Take 325 mg by mouth daily.      Marland Kitchen atorvastatin (LIPITOR) 40 MG tablet Take 40 mg by mouth Daily.      . Cholecalciferol (VITAMIN D) 2000 UNITS CAPS Take 2,000 Units by mouth daily.       . citalopram (CELEXA) 40 MG tablet Take 40  mg by mouth daily.        Marland Kitchen ibuprofen (ADVIL,MOTRIN) 100 MG tablet Take 200 mg by mouth every 8 (eight)  hours as needed.      Marland Kitchen levothyroxine (SYNTHROID, LEVOTHROID) 125 MCG tablet Take 1 tablet (125 mcg total) by mouth daily before breakfast.  30 tablet  1  . lisinopril-hydrochlorothiazide (PRINZIDE,ZESTORETIC) 10-12.5 MG per tablet Take 1 tablet by mouth daily.        . metoprolol tartrate (LOPRESSOR) 12.5 mg TABS Take 0.5 tablets (12.5 mg total) by mouth 2 (two) times daily.  30 tablet  1  . Multiple Minerals-Vitamins (CALCIUM & VIT D3 BONE HEALTH PO) Take by mouth every morning.      . pramipexole (MIRAPEX) 0.125 MG tablet Take 0.125-0.25 mg by mouth 2 (two) times daily.       . traZODone (DESYREL) 50 MG tablet Take 50 mg by mouth at bedtime. Takes 25 mg at bedtime       No current facility-administered medications on file prior to visit.   Assessment/Plan: This is a pleasant, 67 year old, white female, with the following issues:  #1.  History of stage IIb squamous cell carcinoma of the left upper lobe.  She is stage IIb by virtue multiple lung nodules within the same lobe.  She had negative lymph nodes.  Her most recent CT scan revealed no evidence of any recurrent or metastatic disease.  I believe we can go with every 6 month scans.  #2.  Followup.  We will follow back up with Katelyn [name] in 6 months, but before then should there be questions or concerns.

## 2012-10-22 ENCOUNTER — Other Ambulatory Visit: Payer: Self-pay | Admitting: *Deleted

## 2012-10-22 MED ORDER — PRAMIPEXOLE DIHYDROCHLORIDE 0.125 MG PO TABS: 0.1250 mg | ORAL_TABLET | Freq: Three times a day (TID) | ORAL | Status: DC

## 2012-10-26 ENCOUNTER — Other Ambulatory Visit: Payer: Self-pay | Admitting: *Deleted

## 2012-10-26 MED ORDER — LEVOTHYROXINE SODIUM 112 MCG PO TABS: 112.0000 ug | ORAL_TABLET | Freq: Every day | ORAL | Status: DC

## 2012-10-26 MED ORDER — METOPROLOL TARTRATE 25 MG PO TABS: ORAL_TABLET | ORAL | Status: DC

## 2012-10-26 NOTE — Telephone Encounter (Signed)
Thyroid in 1/14. Suppose to return in 6 weeks. No labs in chart. Last ov 12/13

## 2012-11-23 ENCOUNTER — Other Ambulatory Visit: Payer: Self-pay | Admitting: Nurse Practitioner

## 2012-11-25 NOTE — Telephone Encounter (Signed)
Patient last seen in office on 06-19-12. Please advise

## 2012-11-27 ENCOUNTER — Other Ambulatory Visit: Payer: Self-pay | Admitting: Nurse Practitioner

## 2012-12-21 ENCOUNTER — Other Ambulatory Visit: Payer: Self-pay | Admitting: Nurse Practitioner

## 2012-12-28 ENCOUNTER — Telehealth: Payer: Self-pay | Admitting: Nurse Practitioner

## 2012-12-28 NOTE — Telephone Encounter (Signed)
appt given  

## 2012-12-29 ENCOUNTER — Ambulatory Visit (INDEPENDENT_AMBULATORY_CARE_PROVIDER_SITE_OTHER): Payer: Medicare Other | Admitting: Nurse Practitioner

## 2012-12-29 ENCOUNTER — Encounter: Payer: Self-pay | Admitting: Nurse Practitioner

## 2012-12-29 VITALS — BP 163/72 | HR 57 | Temp 97.1°F | Ht 65.0 in | Wt 167.0 lb

## 2012-12-29 DIAGNOSIS — I10 Essential (primary) hypertension: Secondary | ICD-10-CM

## 2012-12-29 DIAGNOSIS — E039 Hypothyroidism, unspecified: Secondary | ICD-10-CM

## 2012-12-29 DIAGNOSIS — E785 Hyperlipidemia, unspecified: Secondary | ICD-10-CM

## 2012-12-29 DIAGNOSIS — F329 Major depressive disorder, single episode, unspecified: Secondary | ICD-10-CM

## 2012-12-29 DIAGNOSIS — G2581 Restless legs syndrome: Secondary | ICD-10-CM

## 2012-12-29 DIAGNOSIS — F411 Generalized anxiety disorder: Secondary | ICD-10-CM

## 2012-12-29 DIAGNOSIS — G4700 Insomnia, unspecified: Secondary | ICD-10-CM | POA: Insufficient documentation

## 2012-12-29 MED ORDER — METOPROLOL TARTRATE 25 MG PO TABS: ORAL_TABLET | ORAL | Status: DC

## 2012-12-29 MED ORDER — LISINOPRIL-HYDROCHLOROTHIAZIDE 20-12.5 MG PO TABS: 1.0000 | ORAL_TABLET | Freq: Every day | ORAL | Status: DC

## 2012-12-29 MED ORDER — TRAMADOL HCL 50 MG PO TABS: 50.0000 mg | ORAL_TABLET | Freq: Two times a day (BID) | ORAL | Status: DC | PRN

## 2012-12-29 MED ORDER — ALPRAZOLAM 0.5 MG PO TABS: 0.2500 mg | ORAL_TABLET | ORAL | Status: DC

## 2012-12-29 MED ORDER — ATORVASTATIN CALCIUM 40 MG PO TABS: 40.0000 mg | ORAL_TABLET | Freq: Every day | ORAL | Status: DC

## 2012-12-29 MED ORDER — CITALOPRAM HYDROBROMIDE 40 MG PO TABS: 40.0000 mg | ORAL_TABLET | Freq: Every day | ORAL | Status: DC

## 2012-12-29 MED ORDER — LEVOTHYROXINE SODIUM 112 MCG PO TABS: 112.0000 ug | ORAL_TABLET | Freq: Every day | ORAL | Status: DC

## 2012-12-29 MED ORDER — PRAMIPEXOLE DIHYDROCHLORIDE 0.125 MG PO TABS: 0.1250 mg | ORAL_TABLET | Freq: Three times a day (TID) | ORAL | Status: DC

## 2012-12-29 MED ORDER — TRAZODONE HCL 50 MG PO TABS: 50.0000 mg | ORAL_TABLET | Freq: Every day | ORAL | Status: DC

## 2012-12-29 NOTE — Progress Notes (Signed)
Subjective:    Patient ID: Katelyn [name], female    DOB: [birth year], 67 y.o.   MRN: [id number]  Hypertension This is a chronic problem. The current episode started more than 1 year ago. The problem has been waxing and waning since onset. The problem is uncontrolled. Pertinent negatives include no blurred vision, chest pain, headaches, neck pain, palpitations, peripheral edema, shortness of breath or sweats. There are no associated agents to hypertension. Risk factors for coronary artery disease include dyslipidemia, post-menopausal state and stress. Past treatments include ACE inhibitors, diuretics and beta blockers. The current treatment provides mild improvement. Compliance problems include diet and exercise.  Hypertensive end-organ damage includes a thyroid problem.  Hyperlipidemia This is a chronic problem. The current episode started more than 1 year ago. The problem is uncontrolled. Recent lipid tests were reviewed and are high. Exacerbating diseases include hypothyroidism. Factors aggravating her hyperlipidemia include thiazides. Pertinent negatives include no chest pain or shortness of breath. Current antihyperlipidemic treatment includes statins. The current treatment provides moderate improvement of lipids. Compliance problems include adherence to diet and adherence to exercise.  Risk factors for coronary artery disease include hypertension, post-menopausal and stress.  Thyroid Problem Presents for follow-up (hypothyroidism) visit. Symptoms include fatigue. Patient reports no diaphoresis, dry skin, hair loss, hoarse voice, leg swelling, menstrual problem, palpitations, weight gain or weight loss. The symptoms have been stable. Her past medical history is significant for hyperlipidemia.  Left Lung CA Has follow-up APPOINTMENT WITH ONCOLOGIST IN [place] some back pain on left side. RLS mirapex works well - helps her rest better Depression Celexa helping  Her deal with her medical  problems- keeps her from worrying so much. GAD Xanax working well Insomnia trazadone working well  Review of Systems  Constitutional: Positive for fatigue. Negative for weight loss, weight gain and diaphoresis.  HENT: Negative for hoarse voice and neck pain.   Eyes: Negative for blurred vision.  Respiratory: Negative for shortness of breath.   Cardiovascular: Negative for chest pain and palpitations.  Genitourinary: Negative for menstrual problem.  Neurological: Negative for headaches.       Objective:   Physical Exam  Constitutional: She is oriented to person, place, and time. She appears well-developed and well-nourished.  HENT:  Nose: Nose normal.  Mouth/Throat: Oropharynx is clear and moist.  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  Eyes: EOM are normal.  Neck: Trachea normal, normal range of motion and full passive range of motion without pain. Neck supple. No JVD present. Carotid bruit is not present. No thyromegaly present.  Cardiovascular: Normal rate, regular rhythm, normal heart sounds and intact distal pulses.  Exam reveals no gallop and no friction rub.   No murmur heard. occassional PAC's   Pulmonary/Chest: Effort normal and breath sounds normal. No respiratory distress.  Diminished breath sounds left lower lobe  Abdominal: Soft. Bowel sounds are normal. She exhibits no distension and no mass. There is no tenderness.  Musculoskeletal: Normal range of motion.  Lymphadenopathy:    She has no cervical adenopathy.  Neurological: She is alert and oriented to person, place, and time. She has normal reflexes.  Skin: Skin is warm and dry.  Psychiatric: She has a normal mood and affect. Her behavior is normal. Judgment and thought content normal.     BP 163/72  Pulse 57  Temp(Src) 97.1 F (36.2 C) (Oral)  Ht 5\' 5"  (1.651 m)  Wt 167 lb (75.751 kg)  BMI 27.79 kg/m2      Assessment &  Plan:  1. Hypertension Low Na+ diet Increased lisinopril at  appointment - metoprolol tartrate (LOPRESSOR) 25 MG tablet; TAKE 1/2 TABLET 2 TIMES A DAY  Dispense: 30 tablet; Refill: 5 - lisinopril-hydrochlorothiazide (ZESTORETIC) 20-12.5 MG per tablet; Take 1 tablet by mouth daily.  Dispense: 30 tablet; Refill: 5  2. Hyperlipidemia Low fat diet - atorvastatin (LIPITOR) 40 MG tablet; Take 1 tablet (40 mg total) by mouth daily.  Dispense: 30 tablet; Refill: 5  3. Hypothyroidism Rest - levothyroxine (SYNTHROID, LEVOTHROID) 112 MCG tablet; Take 1 tablet (112 mcg total) by mouth daily.  Dispense: 30 tablet; Refill: 5  4. RLS (restless legs syndrome) Keep legs warm at night - pramipexole (MIRAPEX) 0.125 MG tablet; Take 1 tablet (0.125 mg total) by mouth 3 (three) times daily.  Dispense: 90 tablet; Refill: 5  5. Insomnia Bedtime ritual - traZODone (DESYREL) 50 MG tablet; Take 1 tablet (50 mg total) by mouth at bedtime.  Dispense: 30 tablet; Refill: 5  6. Depression Stress management - citalopram (CELEXA) 40 MG tablet; Take 1 tablet (40 mg total) by mouth daily.  Dispense: 30 tablet; Refill: 5  7. GAD (generalized anxiety disorder) Stress management - ALPRAZolam (XANAX) 0.5 MG tablet; Take 0.5 tablets (0.25 mg total) by mouth 1 day or 1 dose.  Dispense: 30 tablet; Refill: 2  Health maintenance reviewed  8. Lung CA Keep follow up appointments with oncologist  Orders Placed This Encounter  Procedures  . COMPLETE METABOLIC PANEL WITH GFR  . NMR Lipoprofile with Lipids  . Thyroid Panel With TSH    Mary-Margaret Daphine Deutscher, FNP

## 2012-12-29 NOTE — Patient Instructions (Signed)

## 2013-01-04 ENCOUNTER — Other Ambulatory Visit: Payer: Self-pay | Admitting: *Deleted

## 2013-01-04 NOTE — Telephone Encounter (Signed)
error 

## 2013-01-05 ENCOUNTER — Encounter (HOSPITAL_COMMUNITY): Payer: Self-pay | Admitting: *Deleted

## 2013-01-05 ENCOUNTER — Other Ambulatory Visit: Payer: Self-pay | Admitting: *Deleted

## 2013-01-05 ENCOUNTER — Inpatient Hospital Stay (HOSPITAL_COMMUNITY)
Admission: EM | Admit: 2013-01-05 | Discharge: 2013-01-09 | DRG: 194 | Disposition: A | Discharge status: Home / Self Care | Payer: Medicare Other | Attending: Family Medicine | Admitting: Family Medicine

## 2013-01-05 ENCOUNTER — Ambulatory Visit (INDEPENDENT_AMBULATORY_CARE_PROVIDER_SITE_OTHER): Payer: Medicare Other

## 2013-01-05 ENCOUNTER — Ambulatory Visit (INDEPENDENT_AMBULATORY_CARE_PROVIDER_SITE_OTHER): Payer: Medicare Other | Admitting: Nurse Practitioner

## 2013-01-05 ENCOUNTER — Emergency Department (HOSPITAL_COMMUNITY): Payer: Medicare Other

## 2013-01-05 ENCOUNTER — Encounter: Payer: Self-pay | Admitting: Nurse Practitioner

## 2013-01-05 VITALS — BP 104/61 | HR 66 | Temp 97.7°F | Ht 64.0 in | Wt 164.0 lb

## 2013-01-05 DIAGNOSIS — R0789 Other chest pain: Secondary | ICD-10-CM | POA: Diagnosis present

## 2013-01-05 DIAGNOSIS — Z853 Personal history of malignant neoplasm of breast: Secondary | ICD-10-CM

## 2013-01-05 DIAGNOSIS — R0781 Pleurodynia: Secondary | ICD-10-CM

## 2013-01-05 DIAGNOSIS — R071 Chest pain on breathing: Secondary | ICD-10-CM | POA: Diagnosis present

## 2013-01-05 DIAGNOSIS — C3492 Malignant neoplasm of unspecified part of left bronchus or lung: Secondary | ICD-10-CM

## 2013-01-05 DIAGNOSIS — R0609 Other forms of dyspnea: Secondary | ICD-10-CM

## 2013-01-05 DIAGNOSIS — E039 Hypothyroidism, unspecified: Secondary | ICD-10-CM | POA: Diagnosis present

## 2013-01-05 DIAGNOSIS — R911 Solitary pulmonary nodule: Secondary | ICD-10-CM | POA: Diagnosis present

## 2013-01-05 DIAGNOSIS — Z87891 Personal history of nicotine dependence: Secondary | ICD-10-CM

## 2013-01-05 DIAGNOSIS — M79622 Pain in left upper arm: Secondary | ICD-10-CM

## 2013-01-05 DIAGNOSIS — G2581 Restless legs syndrome: Secondary | ICD-10-CM

## 2013-01-05 DIAGNOSIS — Z902 Acquired absence of lung [part of]: Secondary | ICD-10-CM

## 2013-01-05 DIAGNOSIS — I10 Essential (primary) hypertension: Secondary | ICD-10-CM | POA: Diagnosis present

## 2013-01-05 DIAGNOSIS — C3490 Malignant neoplasm of unspecified part of unspecified bronchus or lung: Secondary | ICD-10-CM | POA: Diagnosis present

## 2013-01-05 DIAGNOSIS — I639 Cerebral infarction, unspecified: Secondary | ICD-10-CM

## 2013-01-05 DIAGNOSIS — Z5189 Encounter for other specified aftercare: Secondary | ICD-10-CM

## 2013-01-05 DIAGNOSIS — R222 Localized swelling, mass and lump, trunk: Secondary | ICD-10-CM

## 2013-01-05 DIAGNOSIS — G4700 Insomnia, unspecified: Secondary | ICD-10-CM

## 2013-01-05 DIAGNOSIS — E785 Hyperlipidemia, unspecified: Secondary | ICD-10-CM

## 2013-01-05 DIAGNOSIS — R079 Chest pain, unspecified: Secondary | ICD-10-CM

## 2013-01-05 DIAGNOSIS — C50919 Malignant neoplasm of unspecified site of unspecified female breast: Secondary | ICD-10-CM | POA: Diagnosis present

## 2013-01-05 DIAGNOSIS — J189 Pneumonia, unspecified organism: Principal | ICD-10-CM | POA: Diagnosis present

## 2013-01-05 LAB — BASIC METABOLIC PANEL
CO2: 31 mEq/L (ref 19–32)
Calcium: 9.9 mg/dL (ref 8.4–10.5)
Chloride: 100 mEq/L (ref 96–112)
Glucose, Bld: 82 mg/dL (ref 70–99)
Potassium: 3.7 mEq/L (ref 3.5–5.1)
Sodium: 138 mEq/L (ref 135–145)

## 2013-01-05 LAB — CBC WITH DIFFERENTIAL/PLATELET
Eosinophils Absolute: 0.1 10*3/uL (ref 0.0–0.7)
Eosinophils Relative: 1 % (ref 0–5)
Hemoglobin: 13 g/dL (ref 12.0–15.0)
Lymphocytes Relative: 28 % (ref 12–46)
Lymphs Abs: 2.9 10*3/uL (ref 0.7–4.0)
MCH: 31.3 pg (ref 26.0–34.0)
MCV: 94.5 fL (ref 78.0–100.0)
Monocytes Relative: 10 % (ref 3–12)
Platelets: 217 10*3/uL (ref 150–400)
RBC: 4.16 MIL/uL (ref 3.87–5.11)
WBC: 10.3 10*3/uL (ref 4.0–10.5)

## 2013-01-05 LAB — POCT I-STAT TROPONIN I: Troponin i, poc: 0 ng/mL (ref 0.00–0.08)

## 2013-01-05 MED ORDER — ASPIRIN 325 MG PO TABS
325.0000 mg | ORAL_TABLET | Freq: Once | ORAL | Status: DC
  Filled 2013-01-05: qty 1

## 2013-01-05 MED ORDER — MORPHINE SULFATE 4 MG/ML IJ SOLN
4.0000 mg | Freq: Once | INTRAMUSCULAR | Status: AC
  Administered 2013-01-05: 4 mg via INTRAVENOUS
  Filled 2013-01-05: qty 1

## 2013-01-05 MED ORDER — IOHEXOL 350 MG/ML SOLN
100.0000 mL | Freq: Once | INTRAVENOUS | Status: AC | PRN
  Administered 2013-01-05: 100 mL via INTRAVENOUS

## 2013-01-05 MED ORDER — DEXTROSE 5 % IV SOLN
1.0000 g | Freq: Once | INTRAVENOUS | Status: AC
  Administered 2013-01-05: 1 g via INTRAVENOUS
  Filled 2013-01-05: qty 10

## 2013-01-05 MED ORDER — DEXTROSE 5 % IV SOLN
500.0000 mg | Freq: Once | INTRAVENOUS | Status: AC
  Administered 2013-01-05: 500 mg via INTRAVENOUS
  Filled 2013-01-05: qty 500

## 2013-01-05 NOTE — ED Notes (Signed)
PT sent from PCP office today because of [facility] and a change on Chest x-ray. Pt reports a Hx of lung CA with a resection.

## 2013-01-05 NOTE — ED Notes (Signed)
Admitting MD at bedside.

## 2013-01-05 NOTE — Progress Notes (Signed)
Pt called to report she was having pain on the left side of her chest under her arm. Went to see her PCP who did not order a CXR and recommended that she contact Dr Myna Hidalgo to move her appt up. Her CT from March was negative for recurrence. Reviewed with Dr Myna Hidalgo. To schedule with French Ana on Thurs or Friday. Pt understood that someone would be calling to set this up. To have CXR prior to appt.

## 2013-01-05 NOTE — Progress Notes (Signed)
  Subjective:    Patient ID: Katelyn [name], female    DOB: 1945-12-13, 68 y.o.   MRN: [id number]  HPI Patient in c/o continuing soreness of pain inleft chest wall that radiates around chest wall and up into shoulder- describes pain as a stinging burning pain- gets SOB at times-at times it feels like something is sitting on her chest- exercise increases pain and rest decreases pain- tramadol helps pain also. Patient has a history of left lung CA and has had lung surgery.    Review of Systems  Constitutional: Negative.   HENT: Negative.   Eyes: Negative.   Respiratory: Positive for shortness of breath.        DOE  Cardiovascular: Positive for chest pain.  Genitourinary: Negative.        Objective:   Physical Exam  Constitutional: She appears well-developed and well-nourished.  Cardiovascular: Normal rate and normal heart sounds.   Pulmonary/Chest: Effort normal and breath sounds normal.  Diminished left lower lobe   Chest x ray- no acute findings- Surgical clips visible left middle lung field-questionable left upper lung mass with decreased upper lung borders--Preliminary reading by Paulene Floor, FNP  [facility]       Assessment & Plan:   1. Chest pain   2. DOE (dyspnea on exertion)    Chest xray changes To Er via EMS O2 Vis nasal cannulla @ 3 L  Mary-Margaret Daphine Deutscher, FNP

## 2013-01-05 NOTE — ED Notes (Signed)
Patient transported to CT 

## 2013-01-05 NOTE — ED Provider Notes (Signed)
History    CSN: [id number] Arrival date & time 01/05/13  1651  First MD Initiated Contact with Patient 01/05/13 1659     Chief Complaint  Patient presents with  . Shortness of Breath    Patient is a 67 y.o. female presenting with chest pain. The history is provided by the patient.  Chest Pain Pain location:  L chest Pain quality: burning   Pain quality comment:  Stinging Pain radiates to:  L shoulder Pain severity:  Mild Onset quality:  Gradual Duration:  2 months Timing:  Intermittent Progression:  Worsening Chronicity:  Recurrent Relieved by:  Nothing Worsened by:  Nothing tried Associated symptoms: shortness of breath   Associated symptoms: no abdominal pain, no fever, no syncope, not vomiting and no weakness   pt reports intermittent CP for past 2 months, reports burning and stinging on pain She is unsure what makes pain worse She has h/o lung ca with lung resection in past and is no longer on chemotherapy She denies known h/o CAD/PE Past Medical History  Diagnosis Date  . 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 Pulmonary nodule, left 08/26/2011  . Hypertension   . Restless leg syndrome   . Peripheral vascular disease   . Carotid artery stenosis     bilateral  . Glomus tumor      right  . Shortness of breath     exertional  . Hypothyroidism   . Cancer     breast, bilateral s/p mast and chemo  . Shoulder pain, left     due to vascular dz  . Left hip pain     when ambulatory  . Episodic low back pain   . Depression     stress related  . Hard of hearing    Past Surgical History  Procedure Laterality Date  . Breast surgery  7829,5621    bilateral mastectomy  . Portacath placement  1996    removed after chemo  . Vascular stent  2005    left subclavian  . Mastectomy      bilateral  . Tubal ligation    . Tonsillectomy      age 59  . Vascular surgery     Family History  Problem Relation Age of Onset  . Anesthesia problems Neg Hx    History  Substance  Use Topics  . Smoking status: Former Smoker -- 1.00 packs/day for 40 years    Types: Cigarettes    Quit date: 07/09/2011  . Smokeless tobacco: Former Neurosurgeon    Quit date: 07/09/2011     Comment: electronic cigarette daily  . Alcohol Use: No   OB History   Grav Para Term Preterm Abortions TAB SAB Ect Mult Living                 Review of Systems  Constitutional: Negative for fever.  Respiratory: Positive for shortness of breath.   Cardiovascular: Positive for chest pain. Negative for syncope.  Gastrointestinal: Negative for vomiting and abdominal pain.  Neurological: Negative for weakness.  All other systems reviewed and are negative.    Allergies  Review of patient's allergies indicates no known allergies.  Home Medications   Current Outpatient Rx  Name  Route  Sig  Dispense  Refill  . ALPRAZolam (XANAX) 0.5 MG tablet   Oral   Take 0.5 tablets (0.25 mg total) by mouth 1 day or 1 dose.   30 tablet   2   . aspirin 325 MG tablet  Oral   Take 325 mg by mouth daily.         Marland Kitchen atorvastatin (LIPITOR) 40 MG tablet   Oral   Take 1 tablet (40 mg total) by mouth daily.   30 tablet   5   . Cholecalciferol (VITAMIN D) 2000 UNITS CAPS   Oral   Take 2,000 Units by mouth daily.          . citalopram (CELEXA) 40 MG tablet   Oral   Take 1 tablet (40 mg total) by mouth daily.   30 tablet   5   . ibuprofen (ADVIL,MOTRIN) 100 MG tablet   Oral   Take 200 mg by mouth every 8 (eight) hours as needed.         Marland Kitchen levothyroxine (SYNTHROID, LEVOTHROID) 112 MCG tablet   Oral   Take 1 tablet (112 mcg total) by mouth daily.   30 tablet   5     NTBS for lab work   . lisinopril-hydrochlorothiazide (ZESTORETIC) 20-12.5 MG per tablet   Oral   Take 1 tablet by mouth daily.   30 tablet   5   . metoprolol tartrate (LOPRESSOR) 25 MG tablet   Oral   Take 12.5 mg by mouth 2 (two) times daily.         . Multiple Minerals-Vitamins (CALCIUM & VIT D3 BONE HEALTH PO)   Oral    Take by mouth every morning.         . pramipexole (MIRAPEX) 0.125 MG tablet   Oral   Take 1 tablet (0.125 mg total) by mouth 3 (three) times daily.   90 tablet   5   . traMADol (ULTRAM) 50 MG tablet   Oral   Take 1 tablet (50 mg total) by mouth 2 (two) times daily as needed for pain.   60 tablet   1   . traZODone (DESYREL) 50 MG tablet   Oral   Take 1 tablet (50 mg total) by mouth at bedtime.   30 tablet   5    BP 104/87  Pulse 65  Temp(Src) 98.4 F (36.9 C) (Oral)  Resp 27  SpO2 100% BP 165/74  Pulse 65  Temp(Src) 98.4 F (36.9 C) (Oral)  Resp 21  SpO2 97%  Physical Exam CONSTITUTIONAL: frail, elderly HEAD: Normocephalic/atraumatic EYES: EOMI/PERRL ENMT: Mucous membranes moist, hoarse voice noted (baseline per patient NECK: supple no meningeal signs SPINE:entire spine nontender CV: S1/S2 noted, no murmurs/rubs/gallops noted LUNGS: Lungs are clear to auscultation bilaterally, no apparent distress ABDOMEN: soft, nontender, no rebound or guarding GU:no cva tenderness NEURO: Pt is awake/alert, moves all extremitiesx4 EXTREMITIES: pulses normal, full ROM, no calf tenderness noted, no edema to lower extremities SKIN: warm, color normal PSYCH: no abnormalities of mood noted  ED Course  Procedures   6:50 PM Pt here with CP.  She has h/o cancer and some stinging pain which she thinks may be related to deep breathing.  D-dimer ordered.  She had told her PCP that pain was worse with exertion, but denied this on my eval Workup pending at this time  11:25 PM No PE noted on CT chest Pt does have pneumonia as well as probably chest wall lesion, suspect metastasis per radiology I discussed this with patient and her family and discussed need for further evaluation She has not been admitted recently, and lives at home, this is community acquired pneumonia D/w dr Conley Rolls, will admit to medicine Labs Reviewed  BASIC METABOLIC PANEL - Abnormal;  Notable for the following:     Creatinine, Ser 1.18 (*)    GFR calc non Af Amer 47 (*)    GFR calc Af Amer 54 (*)    All other components within normal limits  CBC WITH DIFFERENTIAL  D-DIMER, QUANTITATIVE  POCT I-STAT TROPONIN I   Dg Chest 2 View  01/05/2013   *RADIOLOGY REPORT*  Clinical Data: History of chest pain. History of lung cancer. History of previous partial left pneumonectomy.  CHEST - 2 VIEW  Comparison: 12/11/2011.  CT 09/09/2012.  Findings: The rotation of scoliosis convexity to the left is again present.  Cardiac silhouette is borderline size. Ectasia and nonaneurysmal calcification of the thoracic aorta are seen. Noncalcific pleural thickening, fibrosis, volume loss in the left upper lobe is seen with elevation of the left hilum.  Surgical clips are seen in the left axillary region. There is a left subclavian stent.  There is chronic elevation of the left hemidiaphragm.  There is granuloma calcification in the right upper lobe.  No active granulomatous process is seen. No adenopathy or pleural effusion is seen.  No pulmonary nodules or masses are evident.  There is osteopenic appearance of the bones.  Changes of degenerative disc disease and degenerative spondylosis are present. No blastic or lytic disease is evident.  IMPRESSION: No acute or active pleural or parenchymal abnormalities are identified.  Chronic findings are discussed in detail above.  Clinically significant discrepancy from primary report, if provided: None   Original Report Authenticated By: Onalee Hua Call     MDM  Nursing notes including past medical history and social history reviewed and considered in documentation Labs/vital reviewed and considered xrays reviewed and considered Previous records reviewed and considered - office visit records reviewed     Date: 01/05/2013  Rate: 65  Rhythm: normal sinus rhythm  QRS Axis: normal  Intervals: normal  ST/T Wave abnormalities: nonspecific ST changes  Conduction Disutrbances:none  Narrative  Interpretation: LVH noted  Old EKG Reviewed: unchanged Significant artifact noted     Date: 01/05/2013 1837  Rate: 59  Rhythm: normal sinus rhythm  QRS Axis: right  Intervals: normal  ST/T Wave abnormalities: nonspecific ST changes  Conduction Disutrbances:none  Narrative Interpretation:   Old EKG Reviewed: repeat EKG shows ST changes in inferior leads      Joya Gaskins, MD 01/05/13 2331

## 2013-01-06 ENCOUNTER — Telehealth: Payer: Self-pay | Admitting: Hematology & Oncology

## 2013-01-06 ENCOUNTER — Encounter (HOSPITAL_COMMUNITY): Payer: Self-pay | Admitting: Internal Medicine

## 2013-01-06 DIAGNOSIS — R079 Chest pain, unspecified: Secondary | ICD-10-CM

## 2013-01-06 DIAGNOSIS — J189 Pneumonia, unspecified organism: Principal | ICD-10-CM

## 2013-01-06 DIAGNOSIS — R0781 Pleurodynia: Secondary | ICD-10-CM | POA: Diagnosis present

## 2013-01-06 LAB — CBC
MCHC: 32.9 g/dL (ref 30.0–36.0)
Platelets: 203 10*3/uL (ref 150–400)
RDW: 13.9 % (ref 11.5–15.5)

## 2013-01-06 LAB — CREATININE, SERUM
Creatinine, Ser: 1.04 mg/dL (ref 0.50–1.10)
GFR calc non Af Amer: 54 mL/min — ABNORMAL LOW (ref >90)

## 2013-01-06 MED ORDER — SODIUM CHLORIDE 0.9 % IJ SOLN
3.0000 mL | Freq: Two times a day (BID) | INTRAMUSCULAR | Status: DC
  Administered 2013-01-06 – 2013-01-09 (×6): 3 mL via INTRAVENOUS

## 2013-01-06 MED ORDER — IBUPROFEN 200 MG PO TABS
200.0000 mg | ORAL_TABLET | Freq: Four times a day (QID) | ORAL | Status: DC | PRN
  Filled 2013-01-06 (×2): qty 1

## 2013-01-06 MED ORDER — ASPIRIN 325 MG PO TABS
325.0000 mg | ORAL_TABLET | Freq: Every day | ORAL | Status: DC
  Administered 2013-01-06 – 2013-01-09 (×4): 325 mg via ORAL
  Filled 2013-01-06 (×4): qty 1

## 2013-01-06 MED ORDER — HYDROCHLOROTHIAZIDE 12.5 MG PO CAPS
12.5000 mg | ORAL_CAPSULE | Freq: Every day | ORAL | Status: DC
  Administered 2013-01-06 – 2013-01-09 (×4): 12.5 mg via ORAL
  Filled 2013-01-06 (×4): qty 1

## 2013-01-06 MED ORDER — ALPRAZOLAM 0.25 MG PO TABS
0.2500 mg | ORAL_TABLET | Freq: Every day | ORAL | Status: DC | PRN
  Administered 2013-01-06 (×2): 0.25 mg via ORAL
  Filled 2013-01-06 (×2): qty 1

## 2013-01-06 MED ORDER — DOCUSATE SODIUM 100 MG PO CAPS
100.0000 mg | ORAL_CAPSULE | Freq: Two times a day (BID) | ORAL | Status: DC
  Administered 2013-01-06 – 2013-01-09 (×8): 100 mg via ORAL
  Filled 2013-01-06 (×9): qty 1

## 2013-01-06 MED ORDER — ATORVASTATIN CALCIUM 40 MG PO TABS
40.0000 mg | ORAL_TABLET | Freq: Every day | ORAL | Status: DC
  Administered 2013-01-06 – 2013-01-08 (×3): 40 mg via ORAL
  Filled 2013-01-06 (×4): qty 1

## 2013-01-06 MED ORDER — CITALOPRAM HYDROBROMIDE 40 MG PO TABS
40.0000 mg | ORAL_TABLET | Freq: Every day | ORAL | Status: DC
  Administered 2013-01-06 – 2013-01-09 (×4): 40 mg via ORAL
  Filled 2013-01-06 (×4): qty 1

## 2013-01-06 MED ORDER — LISINOPRIL 20 MG PO TABS
20.0000 mg | ORAL_TABLET | Freq: Every day | ORAL | Status: DC
  Administered 2013-01-06 – 2013-01-09 (×4): 20 mg via ORAL
  Filled 2013-01-06 (×4): qty 1

## 2013-01-06 MED ORDER — DEXTROSE 5 % IV SOLN
1.0000 g | INTRAVENOUS | Status: DC
  Administered 2013-01-06 – 2013-01-08 (×3): 1 g via INTRAVENOUS
  Filled 2013-01-06 (×4): qty 10

## 2013-01-06 MED ORDER — ONDANSETRON HCL 4 MG/2ML IJ SOLN: 4.0000 mg | Freq: Four times a day (QID) | INTRAMUSCULAR | Status: DC | PRN

## 2013-01-06 MED ORDER — VITAMIN D 1000 UNITS PO TABS
2000.0000 [IU] | ORAL_TABLET | Freq: Every day | ORAL | Status: DC
  Administered 2013-01-06 – 2013-01-09 (×4): 2000 [IU] via ORAL
  Filled 2013-01-06 (×4): qty 2

## 2013-01-06 MED ORDER — ONDANSETRON HCL 4 MG PO TABS: 4.0000 mg | ORAL_TABLET | Freq: Four times a day (QID) | ORAL | Status: DC | PRN

## 2013-01-06 MED ORDER — LEVOTHYROXINE SODIUM 112 MCG PO TABS
112.0000 ug | ORAL_TABLET | Freq: Every day | ORAL | Status: DC
  Administered 2013-01-06 – 2013-01-09 (×4): 112 ug via ORAL
  Filled 2013-01-06 (×6): qty 1

## 2013-01-06 MED ORDER — TRAMADOL HCL 50 MG PO TABS
50.0000 mg | ORAL_TABLET | Freq: Two times a day (BID) | ORAL | Status: DC | PRN
  Administered 2013-01-06: 50 mg via ORAL
  Filled 2013-01-06 (×2): qty 1

## 2013-01-06 MED ORDER — DEXTROSE 5 % IV SOLN
500.0000 mg | INTRAVENOUS | Status: DC
  Administered 2013-01-06 – 2013-01-08 (×3): 500 mg via INTRAVENOUS
  Filled 2013-01-06 (×3): qty 500

## 2013-01-06 MED ORDER — METOPROLOL TARTRATE 12.5 MG HALF TABLET
12.5000 mg | ORAL_TABLET | Freq: Two times a day (BID) | ORAL | Status: DC
  Administered 2013-01-06 – 2013-01-09 (×8): 12.5 mg via ORAL
  Filled 2013-01-06 (×9): qty 1

## 2013-01-06 MED ORDER — TRAZODONE HCL 50 MG PO TABS
50.0000 mg | ORAL_TABLET | Freq: Every day | ORAL | Status: DC
  Administered 2013-01-06 – 2013-01-08 (×4): 50 mg via ORAL
  Filled 2013-01-06 (×5): qty 1

## 2013-01-06 MED ORDER — TRAMADOL HCL 50 MG PO TABS
50.0000 mg | ORAL_TABLET | Freq: Four times a day (QID) | ORAL | Status: DC | PRN
  Administered 2013-01-06 – 2013-01-07 (×4): 50 mg via ORAL
  Filled 2013-01-06 (×4): qty 1

## 2013-01-06 MED ORDER — ENOXAPARIN SODIUM 40 MG/0.4ML ~~LOC~~ SOLN
40.0000 mg | SUBCUTANEOUS | Status: DC
  Administered 2013-01-06 – 2013-01-09 (×4): 40 mg via SUBCUTANEOUS
  Filled 2013-01-06 (×4): qty 0.4

## 2013-01-06 MED ORDER — PRAMIPEXOLE DIHYDROCHLORIDE 0.125 MG PO TABS
0.1250 mg | ORAL_TABLET | Freq: Three times a day (TID) | ORAL | Status: DC
  Administered 2013-01-06 – 2013-01-09 (×10): 0.125 mg via ORAL
  Filled 2013-01-06 (×12): qty 1

## 2013-01-06 MED ORDER — LISINOPRIL-HYDROCHLOROTHIAZIDE 20-12.5 MG PO TABS: 1.0000 | ORAL_TABLET | Freq: Every day | ORAL | Status: DC

## 2013-01-06 MED ORDER — HYDROMORPHONE HCL PF 1 MG/ML IJ SOLN: 0.5000 mg | INTRAMUSCULAR | Status: DC | PRN

## 2013-01-06 NOTE — ED Notes (Signed)
[facility] notified for patient transport to [facility], Bed 1520. Report phoned to [facility] receiving RN

## 2013-01-06 NOTE — Progress Notes (Signed)
Patient seen and admitted earlier this AM by my associate. Please refer to his H and P for further details regarding assessment and plan.  I have consulted patient oncologist Dr. Twanna Hy who will see patient and make further recommendations. Patient will require biopsy most likely.  After oncologist has had discussion with patient I will proceed with their recommendations based on the goals of care.  For patient's cap will continue current antibiotic regimen.  Yaileen Hofferber, [employer]

## 2013-01-06 NOTE — ED Notes (Signed)
Patient report given to [facility] with [facility]

## 2013-01-06 NOTE — H&P (Signed)
[facility] History and Physical  Katelyn [name] ZOX:[id number] DOB: 09-14-1945    PCP:   [name], MD   Chief Complaint: Left sided chest pain.  HPI: Katelyn [name] is an 67 y.o. female with hx of SCC lung, s/p left apical hemithorax, breast cancer (90's s/p bilateral mastectomy, bilateral carotid stenosis, HTN, hypothyroidism, presents to the ER with left sided chest pain.  She has no fever, chills, or productive coughs.  She denied any shortness of breath.  Evaluation in the ER included a chest CT which showed no PE, but did show new soft tissue lesion with bony destruction, and possible infiltrate.  She has no leukocytosis, and her renal fx tests were normal.  Hospitalist was asked to admit her for treatment of possible CAP, but more so to have prompt oncology consult regarding this new finding.  She is very stable and has no shortness of breath.  Rewiew of Systems:  Constitutional: Negative for malaise, fever and chills. No significant weight loss or weight gain Eyes: Negative for eye pain, redness and discharge, diplopia, visual changes, or flashes of light. ENMT: Negative for ear pain, hoarseness, nasal congestion, sinus pressure and sore throat. No headaches; tinnitus, drooling, or problem swallowing. Cardiovascular: Negative for chest pain, palpitations, diaphoresis, dyspnea and peripheral edema. ; No orthopnea, PND Respiratory: Negative for cough, hemoptysis, wheezing and stridor.  Gastrointestinal: Negative for nausea, vomiting, diarrhea, constipation, abdominal pain, melena, blood in stool, hematemesis, jaundice and rectal bleeding.    Genitourinary: Negative for frequency, dysuria, incontinence,flank pain and hematuria; Musculoskeletal: Negative for back pain and neck pain. Negative for swelling and trauma.;  Skin: . Negative for pruritus, rash, abrasions, bruising and skin lesion.; ulcerations Neuro: Negative for headache, lightheadedness and neck stiffness.  Negative for weakness, altered level of consciousness , altered mental status, extremity weakness, burning feet, involuntary movement, seizure and syncope.  Psych: negative for anxiety, depression, insomnia, tearfulness, panic attacks, hallucinations, paranoia, suicidal or homicidal ideation    Past Medical History  Diagnosis Date  . 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 Pulmonary nodule, left 08/26/2011  . Hypertension   . Restless leg syndrome   . Peripheral vascular disease   . Carotid artery stenosis     bilateral  . Glomus tumor      right  . Shortness of breath     exertional  . Hypothyroidism   . Cancer     breast, bilateral s/p mast and chemo  . Shoulder pain, left     due to vascular dz  . Left hip pain     when ambulatory  . Episodic low back pain   . Depression     stress related  . Hard of hearing     Past Surgical History  Procedure Laterality Date  . Breast surgery  [id number]    bilateral mastectomy  . Portacath placement  1996    removed after chemo  . Vascular stent  2005    left subclavian  . Mastectomy      bilateral  . Tubal ligation    . Tonsillectomy      age 67  . Vascular surgery      Medications:  HOME MEDS: Prior to Admission medications   Medication Sig Start Date End Date Taking? Authorizing Provider  ALPRAZolam Prudy Feeler) 0.5 MG tablet Take 0.5 tablets (0.25 mg total) by mouth 1 day or 1 dose. 12/29/12  Yes Mary-Margaret Daphine Deutscher, FNP  aspirin 325 MG tablet Take 325 mg by mouth daily.  Yes Historical Provider, MD  atorvastatin (LIPITOR) 40 MG tablet Take 1 tablet (40 mg total) by mouth daily. 12/29/12  Yes Mary-Margaret Daphine Deutscher, FNP  Cholecalciferol (VITAMIN D) 2000 UNITS CAPS Take 2,000 Units by mouth daily.    Yes Historical Provider, MD  citalopram (CELEXA) 40 MG tablet Take 1 tablet (40 mg total) by mouth daily. 12/29/12  Yes Mary-Margaret Daphine Deutscher, FNP  ibuprofen (ADVIL,MOTRIN) 100 MG tablet Take 200 mg by mouth every 8 (eight) hours as needed.    Yes Historical Provider, MD  levothyroxine (SYNTHROID, LEVOTHROID) 112 MCG tablet Take 1 tablet (112 mcg total) by mouth daily. 12/29/12  Yes Mary-Margaret Daphine Deutscher, FNP  lisinopril-hydrochlorothiazide (ZESTORETIC) 20-12.5 MG per tablet Take 1 tablet by mouth daily. 12/29/12  Yes Mary-Margaret Daphine Deutscher, FNP  metoprolol tartrate (LOPRESSOR) 25 MG tablet Take 12.5 mg by mouth 2 (two) times daily.   Yes Historical Provider, MD  Multiple Minerals-Vitamins (CALCIUM & VIT D3 BONE HEALTH PO) Take by mouth every morning.   Yes Historical Provider, MD  pramipexole (MIRAPEX) 0.125 MG tablet Take 1 tablet (0.125 mg total) by mouth 3 (three) times daily. 12/29/12  Yes Mary-Margaret Daphine Deutscher, FNP  traMADol (ULTRAM) 50 MG tablet Take 1 tablet (50 mg total) by mouth 2 (two) times daily as needed for pain. 12/29/12  Yes Mary-Margaret Daphine Deutscher, FNP  traZODone (DESYREL) 50 MG tablet Take 1 tablet (50 mg total) by mouth at bedtime. 12/29/12  Yes Mary-Margaret Daphine Deutscher, FNP     Allergies:  No Known Allergies  Social History:   reports that she quit smoking about 17 months ago. Her smoking use included Cigarettes. She has a 40 pack-year smoking history. She quit smokeless tobacco use about 17 months ago. She reports that she does not drink alcohol or use illicit drugs.  Family History: Family History  Problem Relation Age of Onset  . Anesthesia problems Neg Hx      Physical Exam: Filed Vitals:   01/05/13 2300 01/05/13 2330 01/06/13 0015 01/06/13 0045  BP: 165/74 131/108 112/70 124/49  Pulse: 65 65 59 61  Temp:      TempSrc:      Resp:  15 14 20   SpO2: 97% 95% 95% 100%   Blood pressure 124/49, pulse 61, temperature 98.4 F (36.9 C), temperature source Oral, resp. rate 20, SpO2 100.00%.  GEN:  Pleasant  patient lying in the stretcher in no acute distress; cooperative with exam. PSYCH:  alert and oriented x4; does not appear anxious or depressed; affect is appropriate. HEENT: Mucous membranes pink and anicteric; PERRLA;  EOM intact; no cervical lymphadenopathy nor thyromegaly or carotid bruit; no JVD; There were no stridor. Neck is very supple. Breasts:: Not examined CHEST WALL: chest wall tenderness over her left side. CHEST: Normal respiration, clear to auscultation bilaterally.  HEART: Regular rate and rhythm.  There are no murmur, rub, or gallops.   BACK: No kyphosis or scoliosis; no CVA tenderness ABDOMEN: soft and non-tender; no masses, no organomegaly, normal abdominal bowel sounds; no pannus; no intertriginous candida. There is no rebound and no distention. Rectal Exam: Not done EXTREMITIES: No bone or joint deformity; age-appropriate arthropathy of the hands and knees; no edema; no ulcerations.  There is no calf tenderness. Genitalia: not examined PULSES: 2+ and symmetric SKIN: Normal hydration no rash or ulceration CNS: Cranial nerves 2-12 grossly intact no focal lateralizing neurologic deficit.  Speech is fluent; uvula elevated with phonation, facial symmetry and tongue midline. DTR are normal bilaterally, cerebella exam is intact, barbinski is negative and  strengths are equaled bilaterally.  No sensory loss.   Labs on Admission:  Basic Metabolic Panel:  Recent Labs Lab 01/05/13 1711  NA 138  K 3.7  CL 100  CO2 31  GLUCOSE 82  BUN 14  CREATININE 1.18*  CALCIUM 9.9   Liver Function Tests: No results found for this basename: AST, ALT, ALKPHOS, BILITOT, PROT, ALBUMIN,  in the last 168 hours No results found for this basename: LIPASE, AMYLASE,  in the last 168 hours No results found for this basename: AMMONIA,  in the last 168 hours CBC:  Recent Labs Lab 01/05/13 1711  WBC 10.3  NEUTROABS 6.2  HGB 13.0  HCT 39.3  MCV 94.5  PLT 217   Cardiac Enzymes: No results found for this basename: CKTOTAL, CKMB, CKMBINDEX, TROPONINI,  in the last 168 hours  CBG: No results found for this basename: GLUCAP,  in the last 168 hours   Radiological Exams on Admission: Dg Chest 2  View  01/05/2013   *RADIOLOGY REPORT*  Clinical Data: History of chest pain. History of lung cancer. History of previous partial left pneumonectomy.  CHEST - 2 VIEW  Comparison: 12/11/2011.  CT 09/09/2012.  Findings: The rotation of scoliosis convexity to the left is again present.  Cardiac silhouette is borderline size. Ectasia and nonaneurysmal calcification of the thoracic aorta are seen. Noncalcific pleural thickening, fibrosis, volume loss in the left upper lobe is seen with elevation of the left hilum.  Surgical clips are seen in the left axillary region. There is a left subclavian stent.  There is chronic elevation of the left hemidiaphragm.  There is granuloma calcification in the right upper lobe.  No active granulomatous process is seen. No adenopathy or pleural effusion is seen.  No pulmonary nodules or masses are evident.  There is osteopenic appearance of the bones.  Changes of degenerative disc disease and degenerative spondylosis are present. No blastic or lytic disease is evident.  IMPRESSION: No acute or active pleural or parenchymal abnormalities are identified.  Chronic findings are discussed in detail above.  Clinically significant discrepancy from primary report, if provided: None   Original Report Authenticated By: Onalee Hua Call   Ct Angio Chest W/cm &/or Wo Cm  01/05/2013   *RADIOLOGY REPORT*  Clinical Data: Shortness of breath with pain in the left chest wall. History of breast cancer post mastectomy.  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  CT ANGIOGRAPHY CHEST  Technique:  Multidetector CT imaging of the chest using the standard protocol during bolus administration of intravenous contrast. Multiplanar reconstructed images including MIPs were obtained and reviewed to evaluate the vascular anatomy.  Contrast: OMNIPAQUE IOHEXOL 350 MG/ML SOLN  Comparison: CT chest 09/09/2012  Findings: Technically adequate study with good opacification of the central and segmental  pulmonary arteries.  No focal filling defects.  No evidence of significant pulmonary embolus. Postoperative changes consistent with left thoracotomy and partial pneumonectomy.  Stable left apical scarring is probably postoperative.  Left apical pleural thickening with destruction of the posterior left rib, likely the fifth rib.  This is progressing since the previous study and suggests pleural metastasis invading bony versus bony metastasis extending into the pleura.  The mass is measured at about 4.2 x 2 cm.  Pretracheal and subcarinal lymphadenopathy with lymph nodes measuring up to about 10 mm short axis dimension.  This appears stable since the previous study. Metastatic disease is not excluded.  Stable 8 mm nodule in the right middle lung inferiorly.  New focal airspace consolidation in the right lower lung posteriorly could relate to pneumonia.  Cardiac enlargement.  Coronary artery calcification.  Calcification of thoracic aorta without aneurysm.  Esophagus is decompressed. Surgical clips in the mediastinum.  Postoperative changes with left mastectomy and surgical clips in the axilla.  Emphysematous changes in the lungs.  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 New infiltration in the right lower lung may represent pneumonia.  Additional postoperative and chronic changes as described above.   Original Report Authenticated By: Burman Nieves, M.D.    Assessment/Plan Present on Admission:  . Squamous cell carcinoma lung . Hypothyroidism . Breast cancer . Hypertension . Pulmonary nodule, left . CAP (community acquired pneumonia) . Rib pain on left side  PLAN:  She is very stable with respect to possible PNA, and will be treated, but we'll transfer her to [facility] in hope Dr Burna Cash can consult on her.  Her family would very much like to see Dr Twanna Hy as he has been her oncologist for quite a long time.  She will be  given a small amount of Dilaudid PRN, but she is not in severe pain.  With respect to the new metastatic finding on to the chest wall, I assume it is her SCC recurrence, but I wonder if it could be her breast cancer recurrence also.  Please consult oncology later today.  The rest of her problems are stable, and her home meds were continued.  She is presumed full code, and will be admitted to [facility] service at [facility] hospital.  Thank you for letting me participate in the care of this nice patient.   Other plans as per orders.  Code Status: Presumed FULL Unk Lightning, MD. [facility] Pager [phone] 7pm to 7am.  01/06/2013, 1:10 AM

## 2013-01-06 NOTE — Telephone Encounter (Signed)
Left pt message with 7-10 MD appointment and to call for details about another appointment before MD

## 2013-01-06 NOTE — Progress Notes (Signed)
0400: Was asked to see pt s/p transfer form [facility]. Pt received into [facility] room 1520 from [facility]. Presented to [facility] last evening w/ c/o (L) sided CP. Is being treated for possible PNA. Has h/o breast cancer and SCC lung and there is concern for metastatic disease given findings on Ct chest. Pt transferred to [facility] for prompt consult w/ oncology (Dr Twanna Hy is pt's oncologist). Pt has voiced no c/o since arrival to floor. At bedside pt noted sleeping in NAD. VSS. Will continue to monitor closely.  Leanne Chang, NP-C [facility] Pager [phone]

## 2013-01-06 NOTE — ED Notes (Signed)
Family at bedside updated on plan of care.

## 2013-01-06 NOTE — ED Notes (Signed)
[facility] at bedside to transport patient to [facility]

## 2013-01-07 ENCOUNTER — Encounter (HOSPITAL_COMMUNITY): Payer: Self-pay | Admitting: Radiology

## 2013-01-07 ENCOUNTER — Ambulatory Visit: Payer: Medicare Other | Admitting: Medical

## 2013-01-07 ENCOUNTER — Other Ambulatory Visit: Payer: Medicare Other | Admitting: Lab

## 2013-01-07 DIAGNOSIS — E785 Hyperlipidemia, unspecified: Secondary | ICD-10-CM

## 2013-01-07 DIAGNOSIS — R222 Localized swelling, mass and lump, trunk: Secondary | ICD-10-CM

## 2013-01-07 DIAGNOSIS — C50919 Malignant neoplasm of unspecified site of unspecified female breast: Secondary | ICD-10-CM

## 2013-01-07 DIAGNOSIS — I10 Essential (primary) hypertension: Secondary | ICD-10-CM

## 2013-01-07 DIAGNOSIS — E039 Hypothyroidism, unspecified: Secondary | ICD-10-CM

## 2013-01-07 LAB — COMPREHENSIVE METABOLIC PANEL
AST: 15 U/L (ref 0–37)
BUN: 13 mg/dL (ref 6–23)
CO2: 31 mEq/L (ref 19–32)
Calcium: 9.7 mg/dL (ref 8.4–10.5)
Chloride: 101 mEq/L (ref 96–112)
Creatinine, Ser: 1.06 mg/dL (ref 0.50–1.10)
GFR calc Af Amer: 62 mL/min — ABNORMAL LOW (ref >90)
GFR calc non Af Amer: 53 mL/min — ABNORMAL LOW (ref >90)
Total Bilirubin: 0.4 mg/dL (ref 0.3–1.2)

## 2013-01-07 LAB — PROTIME-INR
INR: 1.01 (ref 0.00–1.49)
Prothrombin Time: 13.1 seconds (ref 11.6–15.2)

## 2013-01-07 LAB — PREALBUMIN: Prealbumin: 16.9 mg/dL — ABNORMAL LOW (ref 17.0–34.0)

## 2013-01-07 MED ORDER — OXYCODONE HCL 5 MG PO TABS
5.0000 mg | ORAL_TABLET | Freq: Four times a day (QID) | ORAL | Status: DC | PRN
  Administered 2013-01-07 – 2013-01-09 (×4): 5 mg via ORAL
  Filled 2013-01-07 (×4): qty 1

## 2013-01-07 NOTE — Progress Notes (Signed)
Radiology called and informed that patient could not have CT with biopsy today because they had to many patients scheduled. Will notify MD

## 2013-01-07 NOTE — Consult Note (Signed)
Katelyn [name], PINCUS NO.:  [id number]  MEDICAL RECORD NO.:  [id number]  LOCATION:  1520                         FACILITY:  [facility]  PHYSICIAN:  Josph Macho, M.D.  DATE OF BIRTH:  [birth year]  DATE OF CONSULTATION:  01/07/2013 DATE OF DISCHARGE:                                CONSULTATION   REASON FOR CONSULTATION:  Likely recurrent bronchogenic carcinoma.  HISTORY OF PRESENT ILLNESS:  Katelyn [name] is a very nice 67 year old, white female.  She is very well known to me.  I initially saw her back in 1996, for the inflammatory carcinoma of the right breast.  She, amazingly, has been cured of this.  I think she must have had 19 positive lymph nodes.  She received chemotherapy for this.  She then was found to have a squamous cell carcinoma of the left lung. She was taken to surgery by Dr. Dorris Fetch back in I think March or April of last year.  She was found to have stage IIB (T3, N0, M0) squamous cell carcinoma.  She had multiple nodules I think in the same lobe.  Obviously, lymph nodes were all negative.  There is no pleural involvement.  The patient is doing well.  Her last CT scan that she had done was back in March was looked fine.  Lately, she has been having more problems with chest wall pain.  She has been having more shortness of breath.  She had a little bit of weight loss.  Appetite has not been as good.  She ultimately showed in the emergency room.  She was complaining of some left-sided chest wall pain.  A CT scan was done.  This unfortunately showed a 4.2 x 2 cm pleural based mass on the left.  There was some destruction of the left posterior fifth rib.  No obvious nodules were noted.  There is no lymphadenopathy.  There is a coronary artery calcification.  The liver looked fine.  She was admitted.  On the CT scan, there is also some noted areas of pneumonia.  She is on antibiotics right now.  She is complaining of some discomfort in the left  lateral chest wall.  There is no hemoptysis.  She has had slightly decreased appetite.  There is no nausea or vomiting.  She has had no headache.  We are asked to see her to try to help with management issues.  PAST MEDICAL HISTORY:  Remarkable for: 1. Inflammatory carcinoma of the left breast. 2. Hypertension. 3. Glomus jugulare tumor. 4. Hypothyroidism. 5. Situational depression.  ALLERGIES:  None.  ADMISSION MEDICATIONS: 1. Xanax 0.25 mg p.o. daily p.r.n. 2. Aspirin 325 mg p.o. daily. 3. Lipitor 40 mg p.o. daily. 4. Celexa 40 mg p.o. daily. 5. Synthroid 0.112 mg p.o. daily. 6. Zestoretic (20/12.5) 1 p.o. daily. 7. Lopressor 12.5 mg p.o. b.i.d. 8. Mirapex 0.125 mg p.o. t.i.d. 9. Ultram 50 mg p.o. q.12 hours p.r.n. 10.Trazodone 50 mg p.o. at bedtime.  SOCIAL HISTORY:  Remarkable for past tobacco use.  She stopped smoking before her lung cancer surgery.  She has about a 40 pack history of tobacco use.  She has no history of alcohol use.  There  is no obvious occupational exposures.  FAMILY HISTORY:  Noncontributory.  REVIEW OF SYSTEMS:  As stated in the history of present illness.  PHYSICAL EXAMINATION:  General:  This is a slightly ill-appearing white female, in no obvious distress. VITAL SIGNS:  Temperature of 98.5, pulse 57, respiratory rate 16, blood pressure 153/54. HEAD AND NECK:  Normocephalic, atraumatic skull.  There are no ocular or oral lesions.  She has no palpable cervical or supraclavicular lymph nodes.  Thyroid is not palpable. LUNGS:  With good breath sounds bilaterally.  There are no wheezes.  No rhonchi are noted.  No friction rub is noted. CARDIAC:  Regular rate and rhythm with a normal S1 and S2.  There are no murmurs, rubs, or bruits.  ABDOMEN:  Soft.  She has good bowel sounds. There is no fluid wave.  There is no palpable hepatosplenomegaly. EXTREMITIES:  No clubbing, cyanosis, or edema.  She has good range of motion of her joints.  She has  good strength in her extremities. SKIN:  No rashes, ecchymoses, or petechia. NEUROLOGICAL:  No focal neurological deficits.  LABORATORY STUDIES:  White cell count 8.7, hemoglobin 13, hematocrit 39.5, and platelet count 203.  Sodium 138, potassium 3.7, BUN 14, creatinine 1.18.  Calcium 9.9.  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  This was resected. She had a tough time postop.  She was not a candidate for a systemic adjuvant therapy because of her poor performance status.  We were just following her along.  We now have to worry about recurrent disease.  Again, she is going to need to have a biopsy done.  We will see if Radiology can do a biopsy for [place].  We will hold off on further studies with x-rays until we know exactly what is going on.  If we do find that she has a local recurrence, then we might be able to go with radiation therapy to try to treat the cancer.  I spoke to Katelyn [name] and her family.  Again, I know Katelyn [name] for close to 20 years.  She is very dismayed that she did have recurrent disease already.  Nutrition will be very important for her.  She may need a dietitian consult or nutrition consult while she is in the hospital.  We will follow along.  Again, the issue is tissue, and we need to see what comes out of her biopsy.     Josph Macho, M.D.     PRE/MEDQ  D:  01/07/2013  T:  01/07/2013  Job:  [id number]

## 2013-01-07 NOTE — H&P (Signed)
HPI: Katelyn [name] is an 67 y.o. female with prior history of both breast and lung cancer. She developed some left sided chest pain and was admitted after a CT of the chest showed a posterior left chest wall lesion with soft tissue involvement and destruction of the 5th rib. Oncology has seen the pt and requests biopsy of this lesion. PMHx  and meds reviewed. Pt otherwise feels well with the exception of the discomfort.  Past Medical History:  Past Medical History  Diagnosis Date  . 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 Pulmonary nodule, left 08/26/2011  . Hypertension   . Restless leg syndrome   . Peripheral vascular disease   . Carotid artery stenosis     bilateral  . Glomus tumor      right  . Shortness of breath     exertional  . Hypothyroidism   . Cancer     breast, bilateral s/p mast and chemo  . Shoulder pain, left     due to vascular dz  . Left hip pain     when ambulatory  . Episodic low back pain   . Depression     stress related  . Hard of hearing     Past Surgical History:  Past Surgical History  Procedure Laterality Date  . Breast surgery  4540,9811    bilateral mastectomy  . Portacath placement  1996    removed after chemo  . Vascular stent  2005    left subclavian  . Mastectomy      bilateral  . Tubal ligation    . Tonsillectomy      age 1  . Vascular surgery      Family History:  Family History  Problem Relation Age of Onset  . Anesthesia problems Neg Hx     Social History:  reports that she quit smoking about 18 months ago. Her smoking use included Cigarettes. She has a 40 pack-year smoking history. She quit smokeless tobacco use about 18 months ago. She reports that she does not drink alcohol or use illicit drugs.  Allergies: No Known Allergies  Medications: Medications Prior to Admission  Medication Sig Dispense Refill  . ALPRAZolam (XANAX) 0.5 MG tablet Take 0.5 tablets (0.25 mg total) by mouth 1 day or 1 dose.  30 tablet  2  .  aspirin 325 MG tablet Take 325 mg by mouth daily.      Marland Kitchen atorvastatin (LIPITOR) 40 MG tablet Take 1 tablet (40 mg total) by mouth daily.  30 tablet  5  . Cholecalciferol (VITAMIN D) 2000 UNITS CAPS Take 2,000 Units by mouth daily.       . citalopram (CELEXA) 40 MG tablet Take 1 tablet (40 mg total) by mouth daily.  30 tablet  5  . ibuprofen (ADVIL,MOTRIN) 100 MG tablet Take 200 mg by mouth every 8 (eight) hours as needed.      Marland Kitchen levothyroxine (SYNTHROID, LEVOTHROID) 112 MCG tablet Take 1 tablet (112 mcg total) by mouth daily.  30 tablet  5  . lisinopril-hydrochlorothiazide (ZESTORETIC) 20-12.5 MG per tablet Take 1 tablet by mouth daily.  30 tablet  5  . metoprolol tartrate (LOPRESSOR) 25 MG tablet Take 12.5 mg by mouth 2 (two) times daily.      . Multiple Minerals-Vitamins (CALCIUM & VIT D3 BONE HEALTH PO) Take by mouth every morning.      . pramipexole (MIRAPEX) 0.125 MG tablet Take 1 tablet (0.125 mg total) by mouth 3 (three) times daily.  90 tablet  5  . traMADol (ULTRAM) 50 MG tablet Take 1 tablet (50 mg total) by mouth 2 (two) times daily as needed for pain.  60 tablet  1  . traZODone (DESYREL) 50 MG tablet Take 1 tablet (50 mg total) by mouth at bedtime.  30 tablet  5    Please HPI for pertinent positives, otherwise complete 10 system ROS negative.  Physical Exam: BP 139/66  Pulse 63  Temp(Src) 98 F (36.7 C) (Oral)  Resp 18  Ht 5\' 7"  (1.702 m)  Wt 165 lb (74.844 kg)  BMI 25.84 kg/m2  SpO2 97% Body mass index is 25.84 kg/(m^2).   General Appearance:  Alert, cooperative, no distress, appears stated age  Head:  Normocephalic, without obvious abnormality, atraumatic  ENT: Unremarkable  Neck: Supple, symmetrical, trachea midline  Lungs:   Clear to auscultation bilaterally, no w/r/r, respirations unlabored without use of accessory muscles.  Chest Wall:  Tender left posterior chest wall  Heart:  Regular rate and rhythm, S1, S2 normal, no murmur, rub or gallop.  Neurologic: Normal  affect, no gross deficits.   Results for orders placed during the hospital encounter of 01/05/13 (from the past 48 hour(s))  BASIC METABOLIC PANEL     Status: Abnormal   Collection Time    01/05/13  5:11 PM      Result Value Range   Sodium 138  135 - 145 mEq/L   Potassium 3.7  3.5 - 5.1 mEq/L   Chloride 100  96 - 112 mEq/L   CO2 31  19 - 32 mEq/L   Glucose, Bld 82  70 - 99 mg/dL   BUN 14  6 - 23 mg/dL   Creatinine, Ser 1.47 (*) 0.50 - 1.10 mg/dL   Calcium 9.9  8.4 - 82.9 mg/dL   GFR calc non Af Amer 47 (*) >90 mL/min   GFR calc Af Amer 54 (*) >90 mL/min   Comment:            The eGFR has been calculated     using the CKD EPI equation.     This calculation has not been     validated in all clinical     situations.     eGFR's persistently     <90 mL/min signify     possible Chronic Kidney Disease.  CBC WITH DIFFERENTIAL     Status: None   Collection Time    01/05/13  5:11 PM      Result Value Range   WBC 10.3  4.0 - 10.5 K/uL   RBC 4.16  3.87 - 5.11 MIL/uL   Hemoglobin 13.0  12.0 - 15.0 g/dL   HCT 56.2  13.0 - 86.5 %   MCV 94.5  78.0 - 100.0 fL   MCH 31.3  26.0 - 34.0 pg   MCHC 33.1  30.0 - 36.0 g/dL   RDW 78.4  69.6 - 29.5 %   Platelets 217  150 - 400 K/uL   Neutrophils Relative % 60  43 - 77 %   Neutro Abs 6.2  1.7 - 7.7 K/uL   Lymphocytes Relative 28  12 - 46 %   Lymphs Abs 2.9  0.7 - 4.0 K/uL   Monocytes Relative 10  3 - 12 %   Monocytes Absolute 1.0  0.1 - 1.0 K/uL   Eosinophils Relative 1  0 - 5 %   Eosinophils Absolute 0.1  0.0 - 0.7 K/uL   Basophils Relative 0  0 -  1 %   Basophils Absolute 0.0  0.0 - 0.1 K/uL  POCT I-STAT TROPONIN I     Status: None   Collection Time    01/05/13  5:33 PM      Result Value Range   Troponin i, poc 0.00  0.00 - 0.08 ng/mL   Comment 3            Comment: Due to the release kinetics of cTnI,     a negative result within the first hours     of the onset of symptoms does not rule out     myocardial infarction with certainty.      If myocardial infarction is still suspected,     repeat the test at appropriate intervals.  D-DIMER, QUANTITATIVE     Status: Abnormal   Collection Time    01/05/13  7:45 PM      Result Value Range   D-Dimer, Quant 1.04 (*) 0.00 - 0.48 ug/mL-FEU   Comment:            AT THE INHOUSE ESTABLISHED CUTOFF     VALUE OF 0.48 ug/mL FEU,     THIS ASSAY HAS BEEN DOCUMENTED     IN THE LITERATURE TO HAVE     A SENSITIVITY AND NEGATIVE     PREDICTIVE VALUE OF AT LEAST     98 TO 99%.  THE TEST RESULT     SHOULD BE CORRELATED WITH     AN ASSESSMENT OF THE CLINICAL     PROBABILITY OF DVT / VTE.  CBC     Status: None   Collection Time    01/06/13  5:30 AM      Result Value Range   WBC 8.7  4.0 - 10.5 K/uL   RBC 4.15  3.87 - 5.11 MIL/uL   Hemoglobin 13.0  12.0 - 15.0 g/dL   HCT 08.6  57.8 - 46.9 %   MCV 95.2  78.0 - 100.0 fL   MCH 31.3  26.0 - 34.0 pg   MCHC 32.9  30.0 - 36.0 g/dL   RDW 62.9  52.8 - 41.3 %   Platelets 203  150 - 400 K/uL  CREATININE, SERUM     Status: Abnormal   Collection Time    01/06/13  5:30 AM      Result Value Range   Creatinine, Ser 1.04  0.50 - 1.10 mg/dL   GFR calc non Af Amer 54 (*) >90 mL/min   GFR calc Af Amer 63 (*) >90 mL/min   Comment:            The eGFR has been calculated     using the CKD EPI equation.     This calculation has not been     validated in all clinical     situations.     eGFR's persistently     <90 mL/min signify     possible Chronic Kidney Disease.  COMPREHENSIVE METABOLIC PANEL     Status: Abnormal   Collection Time    01/07/13  7:55 AM      Result Value Range   Sodium 139  135 - 145 mEq/L   Potassium 3.9  3.5 - 5.1 mEq/L   Chloride 101  96 - 112 mEq/L   CO2 31  19 - 32 mEq/L   Glucose, Bld 120 (*) 70 - 99 mg/dL   BUN 13  6 - 23 mg/dL   Creatinine, Ser 2.44  0.50 - 1.10 mg/dL  Calcium 9.7  8.4 - 10.5 mg/dL   Total Protein 7.2  6.0 - 8.3 g/dL   Albumin 3.2 (*) 3.5 - 5.2 g/dL   AST 15  0 - 37 U/L   ALT 9  0 - 35 U/L    Alkaline Phosphatase 82  39 - 117 U/L   Total Bilirubin 0.4  0.3 - 1.2 mg/dL   GFR calc non Af Amer 53 (*) >90 mL/min   GFR calc Af Amer 62 (*) >90 mL/min   Comment:            The eGFR has been calculated     using the CKD EPI equation.     This calculation has not been     validated in all clinical     situations.     eGFR's persistently     <90 mL/min signify     possible Chronic Kidney Disease.  PREALBUMIN     Status: Abnormal   Collection Time    01/07/13  7:55 AM      Result Value Range   Prealbumin 16.9 (*) 17.0 - 34.0 mg/dL  PROTIME-INR     Status: None   Collection Time    01/07/13 10:10 AM      Result Value Range   Prothrombin Time 13.1  11.6 - 15.2 seconds   INR 1.01  0.00 - 1.49  APTT     Status: None   Collection Time    01/07/13 10:10 AM      Result Value Range   aPTT 29  24 - 37 seconds   Dg Chest 2 View  01/05/2013   *RADIOLOGY REPORT*  Clinical Data: History of chest pain. History of lung cancer. History of previous partial left pneumonectomy.  CHEST - 2 VIEW  Comparison: 12/11/2011.  CT 09/09/2012.  Findings: The rotation of scoliosis convexity to the left is again present.  Cardiac silhouette is borderline size. Ectasia and nonaneurysmal calcification of the thoracic aorta are seen. Noncalcific pleural thickening, fibrosis, volume loss in the left upper lobe is seen with elevation of the left hilum.  Surgical clips are seen in the left axillary region. There is a left subclavian stent.  There is chronic elevation of the left hemidiaphragm.  There is granuloma calcification in the right upper lobe.  No active granulomatous process is seen. No adenopathy or pleural effusion is seen.  No pulmonary nodules or masses are evident.  There is osteopenic appearance of the bones.  Changes of degenerative disc disease and degenerative spondylosis are present. No blastic or lytic disease is evident.  IMPRESSION: No acute or active pleural or parenchymal abnormalities are  identified.  Chronic findings are discussed in detail above.  Clinically significant discrepancy from primary report, if provided: None   Original Report Authenticated By: Onalee Hua Call   Ct Angio Chest W/cm &/or Wo Cm  01/05/2013   *RADIOLOGY REPORT*  Clinical Data: Shortness of breath with pain in the left chest wall. History of breast cancer post mastectomy.  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  CT ANGIOGRAPHY CHEST  Technique:  Multidetector CT imaging of the chest using the standard protocol during bolus administration of intravenous contrast. Multiplanar reconstructed images including MIPs were obtained and reviewed to evaluate the vascular anatomy.  Contrast: OMNIPAQUE IOHEXOL 350 MG/ML SOLN  Comparison: CT chest 09/09/2012  Findings: Technically adequate study with good opacification of the central and segmental pulmonary arteries.  No focal filling defects.  No evidence  of significant pulmonary embolus. Postoperative changes consistent with left thoracotomy and partial pneumonectomy.  Stable left apical scarring is probably postoperative.  Left apical pleural thickening with destruction of the posterior left rib, likely the fifth rib.  This is progressing since the previous study and suggests pleural metastasis invading bony versus bony metastasis extending into the pleura.  The mass is measured at about 4.2 x 2 cm.  Pretracheal and subcarinal lymphadenopathy with lymph nodes measuring up to about 10 mm short axis dimension.  This appears stable since the previous study. Metastatic disease is not excluded.  Stable 8 mm nodule in the right middle lung inferiorly.  New focal airspace consolidation in the right lower lung posteriorly could relate to pneumonia.  Cardiac enlargement.  Coronary artery calcification.  Calcification of thoracic aorta without aneurysm.  Esophagus is decompressed. Surgical clips in the mediastinum.  Postoperative changes with left mastectomy and  surgical clips in the axilla.  Emphysematous changes in the lungs.  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 New infiltration in the right lower lung may represent pneumonia.  Additional postoperative and chronic changes as described above.   Original Report Authenticated By: Burman Nieves, M.D.    Assessment/Plan Hx of breast and lung cancer (L)post chest wall lesion Discussed CT guided biopsy with pt and family Explained procedure, risks, complications, use of sedation. Labs reviewed, ok Consent signed in chart If she tolerates procedure well without any complications, she would be fine for discharge tomorrow.  Brayton El PA-C 01/07/2013, 1:42 PM

## 2013-01-07 NOTE — Consult Note (Signed)
#   [id number] is consult note.  Pete E.  Romans 5:3-5

## 2013-01-07 NOTE — Progress Notes (Signed)
[facility] PROGRESS NOTE  JUDIETH MCKOWN WUJ:[id number] DOB: [birth year] DOA: 01/05/2013 PCP: [name], MD  Assessment/Plan: 1. Chest wall lesion/mass - Oncology on board.  Currently plans are for biopsy. Unfortunately patient is unable to obtain biopsy today due to busy radiologist schedule. - Plan is for biopsy next am. Make sure patient is npo  2. CAP - continue current antibiotic regimen. - No fevers and WBC trending down.  3. HPL - stable continue statin  4. Hypothyroidism - Synthroid, stable  5. HTN -Relatively well controlled at this moment.  Code Status: full Family Communication: No family at bedside Disposition Plan: Pending improvement in condition.   Consultants:  Dr. Twanna Hy  Procedures:  none  Antibiotics:  Azithromycin   Rocephin   HPI/Subjective: Patient feels improvement in breathing status.  Is worried about new mass in chest wall.  Daughters had questions regarding process involved in obtaining biopsy results.   Objective: Filed Vitals:   01/06/13 0928 01/06/13 1415 01/06/13 2200 01/07/13 0600  BP: 119/65 113/63 153/54 139/66  Pulse: 57 56 57 63  Temp:  98.1 F (36.7 C) 98.5 F (36.9 C) 98 F (36.7 C)  TempSrc:  Oral Oral Oral  Resp:  18 16 18   Height:      Weight:      SpO2:  99% 100% 97%    Intake/Output Summary (Last 24 hours) at 01/07/13 1043 Last data filed at 01/06/13 1904  Gross per 24 hour  Intake    360 ml  Output      0 ml  Net    360 ml   Filed Weights   01/06/13 0151  Weight: 74.844 kg (165 lb)    Exam:   General:  Pt in NAD, Alert and Awake  Cardiovascular: RRR, no MRG Respiratory: no wheezes, on supplemental oxygen. No increased WOB  Abdomen: soft, NT, ND  Musculoskeletal: no cyanosis or clubbing   Data Reviewed: Basic Metabolic Panel:  Recent Labs Lab 01/05/13 1711 01/06/13 0530 01/07/13 0755  NA 138  --  139  K 3.7  --  3.9  CL 100  --  101  CO2 31  --  31  GLUCOSE 82  --   120*  BUN 14  --  13  CREATININE 1.18* 1.04 1.06  CALCIUM 9.9  --  9.7   Liver Function Tests:  Recent Labs Lab 01/07/13 0755  AST 15  ALT 9  ALKPHOS 82  BILITOT 0.4  PROT 7.2  ALBUMIN 3.2*   No results found for this basename: LIPASE, AMYLASE,  in the last 168 hours No results found for this basename: AMMONIA,  in the last 168 hours CBC:  Recent Labs Lab 01/05/13 1711 01/06/13 0530  WBC 10.3 8.7  NEUTROABS 6.2  --   HGB 13.0 13.0  HCT 39.3 39.5  MCV 94.5 95.2  PLT 217 203   Cardiac Enzymes: No results found for this basename: CKTOTAL, CKMB, CKMBINDEX, TROPONINI,  in the last 168 hours BNP (last 3 results) No results found for this basename: PROBNP,  in the last 8760 hours CBG: No results found for this basename: GLUCAP,  in the last 168 hours  No results found for this or any previous visit (from the past 240 hour(s)).   Studies: Dg Chest 2 View  01/05/2013   *RADIOLOGY REPORT*  Clinical Data: History of chest pain. History of lung cancer. History of previous partial left pneumonectomy.  CHEST - 2 VIEW  Comparison: 12/11/2011.  CT 09/09/2012.  Findings: The rotation of scoliosis convexity to the left is again present.  Cardiac silhouette is borderline size. Ectasia and nonaneurysmal calcification of the thoracic aorta are seen. Noncalcific pleural thickening, fibrosis, volume loss in the left upper lobe is seen with elevation of the left hilum.  Surgical clips are seen in the left axillary region. There is a left subclavian stent.  There is chronic elevation of the left hemidiaphragm.  There is granuloma calcification in the right upper lobe.  No active granulomatous process is seen. No adenopathy or pleural effusion is seen.  No pulmonary nodules or masses are evident.  There is osteopenic appearance of the bones.  Changes of degenerative disc disease and degenerative spondylosis are present. No blastic or lytic disease is evident.  IMPRESSION: No acute or active pleural  or parenchymal abnormalities are identified.  Chronic findings are discussed in detail above.  Clinically significant discrepancy from primary report, if provided: None   Original Report Authenticated By: Onalee Hua Call   Ct Angio Chest W/cm &/or Wo Cm  01/05/2013   *RADIOLOGY REPORT*  Clinical Data: Shortness of breath with pain in the left chest wall. History of breast cancer post mastectomy.  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  CT ANGIOGRAPHY CHEST  Technique:  Multidetector CT imaging of the chest using the standard protocol during bolus administration of intravenous contrast. Multiplanar reconstructed images including MIPs were obtained and reviewed to evaluate the vascular anatomy.  Contrast: OMNIPAQUE IOHEXOL 350 MG/ML SOLN  Comparison: CT chest 09/09/2012  Findings: Technically adequate study with good opacification of the central and segmental pulmonary arteries.  No focal filling defects.  No evidence of significant pulmonary embolus. Postoperative changes consistent with left thoracotomy and partial pneumonectomy.  Stable left apical scarring is probably postoperative.  Left apical pleural thickening with destruction of the posterior left rib, likely the fifth rib.  This is progressing since the previous study and suggests pleural metastasis invading bony versus bony metastasis extending into the pleura.  The mass is measured at about 4.2 x 2 cm.  Pretracheal and subcarinal lymphadenopathy with lymph nodes measuring up to about 10 mm short axis dimension.  This appears stable since the previous study. Metastatic disease is not excluded.  Stable 8 mm nodule in the right middle lung inferiorly.  New focal airspace consolidation in the right lower lung posteriorly could relate to pneumonia.  Cardiac enlargement.  Coronary artery calcification.  Calcification of thoracic aorta without aneurysm.  Esophagus is decompressed. Surgical clips in the mediastinum.  Postoperative  changes with left mastectomy and surgical clips in the axilla.  Emphysematous changes in the lungs.  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 New infiltration in the right lower lung may represent pneumonia.  Additional postoperative and chronic changes as described above.   Original Report Authenticated By: Burman Nieves, M.D.    Scheduled Meds: . aspirin  325 mg Oral Daily  . atorvastatin  40 mg Oral Daily  . azithromycin  500 mg Intravenous Q24H  . cefTRIAXone (ROCEPHIN)  IV  1 g Intravenous Q24H  . cholecalciferol  2,000 Units Oral Daily  . citalopram  40 mg Oral Daily  . docusate sodium  100 mg Oral BID  . enoxaparin (LOVENOX) injection  40 mg Subcutaneous Q24H  . lisinopril  20 mg Oral Daily   And  . hydrochlorothiazide  12.5 mg Oral Daily  . levothyroxine  112  mcg Oral Daily  . metoprolol tartrate  12.5 mg Oral BID  . pramipexole  0.125 mg Oral TID  . sodium chloride  3 mL Intravenous Q12H  . traZODone  50 mg Oral QHS   Continuous Infusions:   Principal Problem:   Rib pain on left side Active Problems:   Breast cancer   Pulmonary nodule, left   S/P lobectomy of lung   Squamous cell carcinoma lung   Hypertension   Hypothyroidism   CAP (community acquired pneumonia)    Time spent: > 35 minutes    [name]  [facility] Pager [phone]. If 7PM-7AM, please contact night-coverage at [email or web address], password [facility] 01/07/2013, 10:43 AM  LOS: 2 days

## 2013-01-08 ENCOUNTER — Inpatient Hospital Stay (HOSPITAL_COMMUNITY): Payer: Medicare Other

## 2013-01-08 LAB — TSH: TSH: 1.127 u[IU]/mL (ref 0.350–4.500)

## 2013-01-08 MED ORDER — ZOLEDRONIC ACID 4 MG/5ML IV CONC: 4.0000 mg | Freq: Once | INTRAVENOUS | Status: DC

## 2013-01-08 MED ORDER — FENTANYL 12 MCG/HR TD PT72
12.5000 ug | MEDICATED_PATCH | TRANSDERMAL | Status: DC
  Administered 2013-01-08: 12.5 ug via TRANSDERMAL
  Filled 2013-01-08: qty 1

## 2013-01-08 MED ORDER — FENTANYL CITRATE 0.05 MG/ML IJ SOLN
INTRAMUSCULAR | Status: AC | PRN
  Administered 2013-01-08: 100 ug via INTRAVENOUS

## 2013-01-08 MED ORDER — SODIUM CHLORIDE 0.9 % IV SOLN
60.0000 mg | Freq: Once | INTRAVENOUS | Status: AC
  Administered 2013-01-08: 60 mg via INTRAVENOUS
  Filled 2013-01-08: qty 20
  Filled 2013-01-08: qty 6.67

## 2013-01-08 MED ORDER — MIDAZOLAM HCL 2 MG/2ML IJ SOLN
INTRAMUSCULAR | Status: AC | PRN
  Administered 2013-01-08: 1 mg via INTRAVENOUS
  Administered 2013-01-08: 0.5 mg via INTRAVENOUS

## 2013-01-08 NOTE — Procedures (Signed)
Procedure:  CT guided core biopsy of left chest wall/pleural mass Findings:  18 G core biopsy x 3 via 17 G needle of left posterior chest wall mass.  No PTX or hemorrhage encountered.

## 2013-01-08 NOTE — Progress Notes (Signed)
[facility] PROGRESS NOTE  Katelyn [name] QIO:[id number] DOB: 19-Apr-1946 DOA: 01/05/2013 PCP: Katelyn Heap, MD  Assessment/Plan: 1. Chest wall lesion/mass - Oncology on board.  - Biopsy obtained. Will observe for another 24 hours after biopsy - Plan will be for patient to follow up with oncologist for biopsy results once discharged. - Pt place on fentanyl patch. Currently pain better controlled.  2. CAP - continue current antibiotic regimen. - No fevers and WBC trending down. - Plan will be to switch to oral regimen next am.  3. HPL - stable continue statin  4. Hypothyroidism - Synthroid, stable  5. HTN -Relatively well controlled at this moment.  Code Status: full Family Communication: No family at bedside Disposition Plan: Pending improvement in condition.   Consultants:  Dr. Twanna [name]  Procedures:  none  Antibiotics:  Azithromycin   Rocephin   HPI/Subjective: No new complaints patient is happy to be eating.  Objective: Filed Vitals:   01/08/13 1108 01/08/13 1133 01/08/13 1203 01/08/13 1400  BP: 143/67 117/61 136/69 125/71  Pulse: 64 56 56 55  Temp: 98 F (36.7 C) 98 F (36.7 C) 97.4 F (36.3 C) 97.5 F (36.4 C)  TempSrc:  Oral Oral Oral  Resp: 16 16 16 16   Height:      Weight:      SpO2: 97% 98% 99% 95%   No intake or output data in the 24 hours ending 01/08/13 1734 Filed Weights   01/06/13 0151  Weight: 74.844 kg (165 lb)    Exam:   General:  Pt in NAD, Alert and Awake  Cardiovascular: RRR, no MRG Respiratory: no wheezes, on supplemental oxygen. No increased WOB  Abdomen: soft, NT, ND  Musculoskeletal: no cyanosis or clubbing   Data Reviewed: Basic Metabolic Panel:  Recent Labs Lab 01/05/13 1711 01/06/13 0530 01/07/13 0755  NA 138  --  139  K 3.7  --  3.9  CL 100  --  101  CO2 31  --  31  GLUCOSE 82  --  120*  BUN 14  --  13  CREATININE 1.18* 1.04 1.06  CALCIUM 9.9  --  9.7   Liver Function Tests:  Recent  Labs Lab 01/07/13 0755  AST 15  ALT 9  ALKPHOS 82  BILITOT 0.4  PROT 7.2  ALBUMIN 3.2*   No results found for this basename: LIPASE, AMYLASE,  in the last 168 hours No results found for this basename: AMMONIA,  in the last 168 hours CBC:  Recent Labs Lab 01/05/13 1711 01/06/13 0530  WBC 10.3 8.7  NEUTROABS 6.2  --   HGB 13.0 13.0  HCT 39.3 39.5  MCV 94.5 95.2  PLT 217 203   Cardiac Enzymes: No results found for this basename: CKTOTAL, CKMB, CKMBINDEX, TROPONINI,  in the last 168 hours BNP (last 3 results) No results found for this basename: PROBNP,  in the last 8760 hours CBG: No results found for this basename: GLUCAP,  in the last 168 hours  No results found for this or any previous visit (from the past 240 hour(s)).   Studies: Ct Biopsy  01/08/2013   *RADIOLOGY REPORT*  Clinical Data: History of lung and breast carcinoma with new posterior left chest wall and pleural mass.  CT GUIDED CORE BIOPSY OF PLEURAL MASS  Sedation:   1.5 mg IV Versed;  100 mcg IV Fentanyl  Total Moderate Sedation Time: 12 minutes.  Procedure:  The procedure risks, benefits, and alternatives were explained to the patient.  Questions regarding the procedure were encouraged and answered.  The patient understands and consents to the procedure.  The left posterior chest wall was prepped with Betadine in a sterile fashion, and a sterile drape was applied covering the operative field.  A sterile gown and sterile gloves were used for the procedure.  Local anesthesia was provided with 1% Lidocaine.  CT was performed in a prone position to localize the posterior left chest wall mass.  Under CT guidance, a 17 gauge needle was advanced from a posterior approach to the level of the mass.  Coaxial 18- gauge core biopsy samples were obtained.  Three separate core samples were submitted in formalin.  Postbiopsy imaging was performed by CT.  Complications: None.  No pneumothorax.  Findings: Posterior left chest wall mass  was isolated which is causing visible destruction of a segment of the left fifth rib. Solid tissue was obtained from the lesion.  There were no immediate complications.  IMPRESSION: CT guided core biopsy performed of the left posterior pleural/chest wall mass.   Original Report Authenticated By: Katelyn [name], M.D.    Scheduled Meds: . aspirin  325 mg Oral Daily  . atorvastatin  40 mg Oral Daily  . azithromycin  500 mg Intravenous Q24H  . cefTRIAXone (ROCEPHIN)  IV  1 g Intravenous Q24H  . cholecalciferol  2,000 Units Oral Daily  . citalopram  40 mg Oral Daily  . docusate sodium  100 mg Oral BID  . enoxaparin (LOVENOX) injection  40 mg Subcutaneous Q24H  . fentaNYL  12.5 mcg Transdermal Q72H  . lisinopril  20 mg Oral Daily   And  . hydrochlorothiazide  12.5 mg Oral Daily  . levothyroxine  112 mcg Oral Daily  . metoprolol tartrate  12.5 mg Oral BID  . pramipexole  0.125 mg Oral TID  . sodium chloride  3 mL Intravenous Q12H  . traZODone  50 mg Oral QHS   Continuous Infusions:   Principal Problem:   Rib pain on left side Active Problems:   Breast cancer   Pulmonary nodule, left   S/P lobectomy of lung   Squamous cell carcinoma lung   Hypertension   Hypothyroidism   CAP (community acquired pneumonia)   Swelling, mass, or lump in chest   HTN (hypertension)    Time spent: > 35 minutes    Katelyn [name]  [facility] Pager [phone]. If 7PM-7AM, please contact night-coverage at [email or web address], password [facility] 01/08/2013, 5:34 PM  LOS: 3 days

## 2013-01-08 NOTE — Progress Notes (Signed)
Katelyn [name] is looking better. Still having pain on the left side. She is going for biopsy today.  I will put her on a fentanyl patch. I this might help [place] with pain control issues.  I will also give her a dose of Zometa while she is in the hospital.  She looks better. She feels a little better. She is on IV antibiotics. She does have pneumonia.  Her appetite is picking up a little bit.  Her vital signs are good. Blood pressure 159/80. Lungs show some slight decrease on left side. Right lung field is relatively clear. There may be some scattered wheezes. Cardiac exam regular in rhythm with a normal S1-S2. There are no murmurs rubs or bruits. Abdomen is soft. No palpable hepatosplenomegaly is noted. Extremities shows no clubbing cyanosis or edema. Neurological exam shows no focal neurological deficits.  Hopefully, she will be oh to go home in one day. I probably would watch her today after her biopsy. I think IV antibiotics for her pneumonia for now would be a good idea.   We will not have the biopsy results until next week. I can see her as an outpatient and continue our workup.  I greatly appreciate the wonderful care that she is received by the hospitalist in by the staff on 5E!!!  Pete e.  1 Thessalonians 5:16-18

## 2013-01-09 DIAGNOSIS — R911 Solitary pulmonary nodule: Secondary | ICD-10-CM

## 2013-01-09 DIAGNOSIS — Z9889 Other specified postprocedural states: Secondary | ICD-10-CM

## 2013-01-09 MED ORDER — AZITHROMYCIN 500 MG PO TABS
500.0000 mg | ORAL_TABLET | Freq: Every day | ORAL | Status: DC
  Filled 2013-01-09: qty 1

## 2013-01-09 MED ORDER — FENTANYL 12 MCG/HR TD PT72: 1.0000 | MEDICATED_PATCH | TRANSDERMAL | Status: DC

## 2013-01-09 MED ORDER — CEFDINIR 300 MG PO CAPS: 300.0000 mg | ORAL_CAPSULE | Freq: Two times a day (BID) | ORAL | Status: DC

## 2013-01-09 MED ORDER — AZITHROMYCIN 500 MG PO TABS: 500.0000 mg | ORAL_TABLET | Freq: Every day | ORAL | Status: DC

## 2013-01-09 NOTE — Discharge Summary (Signed)
Physician Discharge Summary  Katelyn [name]:[id number] DOB: January 02, 1946 DOA: 01/05/2013  PCP: [name], MD  Admit date: 01/05/2013 Discharge date: 01/09/2013  Time spent: > 35 minutes  Recommendations for Outpatient Follow-up:  1. F/u with biopsy of left chest wall and pleural mass results with Dr. Myna Hidalgo  Discharge Diagnoses:  Principal Problem:   Rib pain on left side Active Problems:   Breast cancer   Pulmonary nodule, left   S/P lobectomy of lung   Squamous cell carcinoma lung   Hypertension   Hypothyroidism   CAP (community acquired pneumonia)   Swelling, mass, or lump in chest   HTN (hypertension)   Discharge Condition: stable  Diet recommendation: Low sodium heart healthy and cardiac diet  Filed Weights   01/06/13 0151  Weight: 74.844 kg (165 lb)    History of present illness:  Patient is a 67 y/o with h/o inflammatory carcinoma of the right breaths in 1996 which reportedly she was cured of this after chemotherapy and 19 positive lymph nodes, also a h/o SCC of the left lung s/p surgery by Dr. Dorris Fetch which at that time pateint was found to have stage IIB (T3, N0, and M0). Patient was followed by oncologist and reportedly was in remission per family. She presented this admission with new 4.2x 2 cm pleural based mass on the left and pneumonia.  Hospital Course:  1. Chest wall lesion/mass - Oncology on board.  - Biopsy obtained of left chest wall and pleural mass by IR. - Plan will be for patient to follow up with oncologist for biopsy results once discharged.  - Pt place on fentanyl patch will continue on discharge. Patient also has tramadol and ibuprofen for discomfort.   2. CAP  - Improved on Rocephin and Azithromycin. Will discharge on azithromycin and omnicef to complete a 7 day total course. - No fevers and WBC trending down.  - Plan will be to switch to oral regimen next am.   3. HPL  - stable continue statin   4. Hypothyroidism  -  Synthroid, stable will continue home regimen.  5. HTN  -Will plan on continuing home regimen.  Procedures: CT guided core biopsy of left chest wall/pleural mass   Consultations:  Dr. Myna Hidalgo with oncology  IR Dr. Irish Lack  Discharge Exam: Filed Vitals:   01/08/13 1203 01/08/13 1400 01/08/13 2206 01/09/13 0600  BP: 136/69 125/71 131/68 140/60  Pulse: 56 55 64 60  Temp: 97.4 F (36.3 C) 97.5 F (36.4 C) 98.7 F (37.1 C) 98.3 F (36.8 C)  TempSrc: Oral Oral Oral Oral  Resp: 16 16 18 20   Height:      Weight:      SpO2: 99% 95% 99% 100%    General: Pt in NAD, ALert and awake Cardiovascular: RRR, no MRG Respiratory: CTA BL, no wheezes  Discharge Instructions  Discharge Orders   Future Appointments Provider Department Dept Phone   03/25/2013 10:00 AM Rachael Fee [facility] [phone]   03/25/2013 10:30 AM [facility] [phone]   Patient to arrive 15 minutes prior to appointment time.   03/30/2013 10:30 AM Josph Macho, MD [facility] [facility] [phone]   Future Orders Complete By Expires     Call MD for:  extreme fatigue  As directed     Call MD for:  persistant dizziness or light-headedness  As directed     Call MD  for:  temperature >100.4  As directed     Diet - low sodium heart healthy  As directed     Discharge instructions  As directed     Comments:      Please be sure to follow up with Dr. Myna Hidalgo for Biopsy results.  Call their office on Monday specifics regarding appointment date and time.    Increase activity slowly  As directed         Medication List         ALPRAZolam 0.5 MG tablet  Commonly known as:  XANAX  Take 0.5 tablets (0.25 mg total) by mouth 1 day or 1 dose.     aspirin 325 MG tablet  Take 325 mg by mouth daily.     atorvastatin 40 MG tablet  Commonly known as:  LIPITOR  Take 1 tablet (40 mg total) by mouth daily.      azithromycin 500 MG tablet  Commonly known as:  ZITHROMAX  Take 1 tablet (500 mg total) by mouth at bedtime.     CALCIUM & VIT D3 BONE HEALTH PO  Take by mouth every morning.     cefdinir 300 MG capsule  Commonly known as:  OMNICEF  Take 1 capsule (300 mg total) by mouth 2 (two) times daily.     citalopram 40 MG tablet  Commonly known as:  CELEXA  Take 1 tablet (40 mg total) by mouth daily.     fentaNYL 12 MCG/HR  Commonly known as:  DURAGESIC - dosed mcg/hr  Place 1 patch (12.5 mcg total) onto the skin every 3 (three) days.     ibuprofen 100 MG tablet  Commonly known as:  ADVIL,MOTRIN  Take 200 mg by mouth every 8 (eight) hours as needed.     levothyroxine 112 MCG tablet  Commonly known as:  SYNTHROID, LEVOTHROID  Take 1 tablet (112 mcg total) by mouth daily.     lisinopril-hydrochlorothiazide 20-12.5 MG per tablet  Commonly known as:  ZESTORETIC  Take 1 tablet by mouth daily.     metoprolol tartrate 25 MG tablet  Commonly known as:  LOPRESSOR  Take 12.5 mg by mouth 2 (two) times daily.     pramipexole 0.125 MG tablet  Commonly known as:  MIRAPEX  Take 1 tablet (0.125 mg total) by mouth 3 (three) times daily.     traMADol 50 MG tablet  Commonly known as:  ULTRAM  Take 1 tablet (50 mg total) by mouth 2 (two) times daily as needed for pain.     traZODone 50 MG tablet  Commonly known as:  DESYREL  Take 1 tablet (50 mg total) by mouth at bedtime.     Vitamin D 2000 UNITS Caps  Take 2,000 Units by mouth daily.       No Known Allergies    The results of significant diagnostics from this hospitalization (including imaging, microbiology, ancillary and laboratory) are listed below for reference.    Significant Diagnostic Studies: Dg Chest 2 View  01/05/2013   *RADIOLOGY REPORT*  Clinical Data: History of chest pain. History of lung cancer. History of previous partial left pneumonectomy.  CHEST - 2 VIEW  Comparison: 12/11/2011.  CT 09/09/2012.  Findings: The rotation  of scoliosis convexity to the left is again present.  Cardiac silhouette is borderline size. Ectasia and nonaneurysmal calcification of the thoracic aorta are seen. Noncalcific pleural thickening, fibrosis, volume loss in the left upper lobe is seen with elevation of the left hilum.  Surgical  clips are seen in the left axillary region. There is a left subclavian stent.  There is chronic elevation of the left hemidiaphragm.  There is granuloma calcification in the right upper lobe.  No active granulomatous process is seen. No adenopathy or pleural effusion is seen.  No pulmonary nodules or masses are evident.  There is osteopenic appearance of the bones.  Changes of degenerative disc disease and degenerative spondylosis are present. No blastic or lytic disease is evident.  IMPRESSION: No acute or active pleural or parenchymal abnormalities are identified.  Chronic findings are discussed in detail above.  Clinically significant discrepancy from primary report, if provided: None   Original Report Authenticated By: Onalee Hua Call   Ct Angio Chest W/cm &/or Wo Cm  01/05/2013   *RADIOLOGY REPORT*  Clinical Data: Shortness of breath with pain in the left chest wall. History of breast cancer post mastectomy.  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  CT ANGIOGRAPHY CHEST  Technique:  Multidetector CT imaging of the chest using the standard protocol during bolus administration of intravenous contrast. Multiplanar reconstructed images including MIPs were obtained and reviewed to evaluate the vascular anatomy.  Contrast: OMNIPAQUE IOHEXOL 350 MG/ML SOLN  Comparison: CT chest 09/09/2012  Findings: Technically adequate study with good opacification of the central and segmental pulmonary arteries.  No focal filling defects.  No evidence of significant pulmonary embolus. Postoperative changes consistent with left thoracotomy and partial pneumonectomy.  Stable left apical scarring is probably  postoperative.  Left apical pleural thickening with destruction of the posterior left rib, likely the fifth rib.  This is progressing since the previous study and suggests pleural metastasis invading bony versus bony metastasis extending into the pleura.  The mass is measured at about 4.2 x 2 cm.  Pretracheal and subcarinal lymphadenopathy with lymph nodes measuring up to about 10 mm short axis dimension.  This appears stable since the previous study. Metastatic disease is not excluded.  Stable 8 mm nodule in the right middle lung inferiorly.  New focal airspace consolidation in the right lower lung posteriorly could relate to pneumonia.  Cardiac enlargement.  Coronary artery calcification.  Calcification of thoracic aorta without aneurysm.  Esophagus is decompressed. Surgical clips in the mediastinum.  Postoperative changes with left mastectomy and surgical clips in the axilla.  Emphysematous changes in the lungs.  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 New infiltration in the right lower lung may represent pneumonia.  Additional postoperative and chronic changes as described above.   Original Report Authenticated By: Burman Nieves, M.D.   Ct Biopsy  01/08/2013   *RADIOLOGY REPORT*  Clinical Data: History of lung and breast carcinoma with new posterior left chest wall and pleural mass.  CT GUIDED CORE BIOPSY OF PLEURAL MASS  Sedation:   1.5 mg IV Versed;  100 mcg IV Fentanyl  Total Moderate Sedation Time: 12 minutes.  Procedure:  The procedure risks, benefits, and alternatives were explained to the patient.  Questions regarding the procedure were encouraged and answered.  The patient understands and consents to the procedure.  The left posterior chest wall was prepped with Betadine in a sterile fashion, and a sterile drape was applied covering the operative field.  A sterile gown and sterile gloves were used for the  procedure.  Local anesthesia was provided with 1% Lidocaine.  CT was performed in a prone position to localize the posterior left chest wall  mass.  Under CT guidance, a 17 gauge needle was advanced from a posterior approach to the level of the mass.  Coaxial 18- gauge core biopsy samples were obtained.  Three separate core samples were submitted in formalin.  Postbiopsy imaging was performed by CT.  Complications: None.  No pneumothorax.  Findings: Posterior left chest wall mass was isolated which is causing visible destruction of a segment of the left fifth rib. Solid tissue was obtained from the lesion.  There were no immediate complications.  IMPRESSION: CT guided core biopsy performed of the left posterior pleural/chest wall mass.   Original Report Authenticated By: Irish Lack, M.D.    Microbiology: No results found for this or any previous visit (from the past 240 hour(s)).   Labs: Basic Metabolic Panel:  Recent Labs Lab 01/05/13 1711 01/06/13 0530 01/07/13 0755  NA 138  --  139  K 3.7  --  3.9  CL 100  --  101  CO2 31  --  31  GLUCOSE 82  --  120*  BUN 14  --  13  CREATININE 1.18* 1.04 1.06  CALCIUM 9.9  --  9.7   Liver Function Tests:  Recent Labs Lab 01/07/13 0755  AST 15  ALT 9  ALKPHOS 82  BILITOT 0.4  PROT 7.2  ALBUMIN 3.2*   No results found for this basename: LIPASE, AMYLASE,  in the last 168 hours No results found for this basename: AMMONIA,  in the last 168 hours CBC:  Recent Labs Lab 01/05/13 1711 01/06/13 0530  WBC 10.3 8.7  NEUTROABS 6.2  --   HGB 13.0 13.0  HCT 39.3 39.5  MCV 94.5 95.2  PLT 217 203   Cardiac Enzymes: No results found for this basename: CKTOTAL, CKMB, CKMBINDEX, TROPONINI,  in the last 168 hours BNP: BNP (last 3 results) No results found for this basename: PROBNP,  in the last 8760 hours CBG: No results found for this basename: GLUCAP,  in the last 168 hours     Signed:  [name]  [facility] 01/09/2013, 1:13 PM

## 2013-01-09 NOTE — Progress Notes (Signed)
PHARMACIST - PHYSICIAN COMMUNICATION DR:  Cena Benton CONCERNING: Antibiotic IV to Oral Route Change Policy  RECOMMENDATION: This patient is receiving Azithromycin by the intravenous route.  Based on criteria approved by the Pharmacy and Therapeutics Committee, the antibiotic(s) is/are being converted to the equivalent oral dose form(s).   DESCRIPTION: These criteria include:  Patient being treated for a respiratory tract infection, urinary tract infection, cellulitis or clostridium difficile associated diarrhea if on metronidazole  The patient is not neutropenic and does not exhibit a GI malabsorption state  The patient is eating (either orally or via tube) and/or has been taking other orally administered medications for a least 24 hours  The patient is improving clinically and has a Tmax < 100.5  If you have questions about this conversion, please contact the Pharmacy Department  []   [phone] )  [facility] []   [phone] )  [facility]  []   [phone] )  [facility] [x]   [phone] )  [facility]   Loralee Pacas, PharmD, BCPS 01/09/2013 12:03 PM

## 2013-01-09 NOTE — Progress Notes (Signed)
Discharge instruction given to son in law and patient both verbalized understanding, no additional question at this, will continue to monitor.

## 2013-01-11 ENCOUNTER — Telehealth: Payer: Self-pay | Admitting: *Deleted

## 2013-01-11 NOTE — Telephone Encounter (Signed)
Pt was sent home on a Fentanyl patch 12.5 mcg from the hospital. Had to sleep in the chair all night and told her dgtr Clayborne Dana that she didn't sleep more than 3 hours at a time. Has Tramadol for breakthrough pain (provided by PCP). She has been taking this BID with mild relief. Reviewed with Eunice Blase, PA in Dr Gustavo Lah absence. To place another patch on to equal a total of 25 mcg per day. May also increase Tramadol to every 8 hours or take 1 or 2 tabs. Her family will come in tomorrow to pick up a new rx for Fentanyl 25 mcg and Oxycodone 5 mg.   Clayborne Dana also inquired about her mother's biopsy results from Friday. Explained that there was nothing noted in the EMR at this time. May call back at the end of the day tomorrow or Wed to see if the results are ready. Explaned that Dr Myna Hidalgo won't be back in the office until Thurs and it may take that long to be ready since her biopsy was done mid day on Friday. She verbalized understanding but stated "we just want to know something". She knows she may call if needed.

## 2013-01-12 ENCOUNTER — Other Ambulatory Visit: Payer: Self-pay | Admitting: *Deleted

## 2013-01-12 DIAGNOSIS — R079 Chest pain, unspecified: Secondary | ICD-10-CM

## 2013-01-12 DIAGNOSIS — C3492 Malignant neoplasm of unspecified part of left bronchus or lung: Secondary | ICD-10-CM

## 2013-01-12 MED ORDER — FENTANYL 25 MCG/HR TD PT72: 1.0000 | MEDICATED_PATCH | TRANSDERMAL | Status: DC

## 2013-01-12 MED ORDER — OXYCODONE HCL 5 MG PO TABS: 5.0000 mg | ORAL_TABLET | ORAL | Status: DC | PRN

## 2013-01-12 NOTE — Telephone Encounter (Signed)
See telephone encounter from 01/11/13 for medication change.

## 2013-01-14 ENCOUNTER — Other Ambulatory Visit: Payer: Self-pay | Admitting: Hematology & Oncology

## 2013-01-15 ENCOUNTER — Ambulatory Visit (HOSPITAL_BASED_OUTPATIENT_CLINIC_OR_DEPARTMENT_OTHER): Payer: Medicare Other | Admitting: Hematology & Oncology

## 2013-01-15 ENCOUNTER — Encounter (HOSPITAL_COMMUNITY): Payer: Self-pay | Admitting: Pharmacy Technician

## 2013-01-15 ENCOUNTER — Other Ambulatory Visit: Payer: Self-pay | Admitting: Radiology

## 2013-01-15 DIAGNOSIS — A5211 Tabes dorsalis: Secondary | ICD-10-CM

## 2013-01-15 DIAGNOSIS — C3492 Malignant neoplasm of unspecified part of left bronchus or lung: Secondary | ICD-10-CM

## 2013-01-15 DIAGNOSIS — M1460 Charcot's joint, unspecified site: Secondary | ICD-10-CM

## 2013-01-15 DIAGNOSIS — C3490 Malignant neoplasm of unspecified part of unspecified bronchus or lung: Secondary | ICD-10-CM

## 2013-01-15 MED ORDER — GABAPENTIN 300 MG PO CAPS: 300.0000 mg | ORAL_CAPSULE | Freq: Three times a day (TID) | ORAL | Status: DC

## 2013-01-15 NOTE — Addendum Note (Signed)
Addended by: Arlan Organ R on: 01/15/2013 09:12 AM   Modules accepted: Orders, Medications

## 2013-01-15 NOTE — Progress Notes (Signed)
This office note has been dictated.

## 2013-01-15 NOTE — Progress Notes (Signed)
CC:   Katelyn [name], M.D.  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  CURRENT THERAPY:  Observation.  INTERIM HISTORY:  Katelyn [name] comes in for a quick visit.  Unfortunately, she now has recurrent lung cancer.  She was admitted to [facility] last week.  On a CT scan, she was found to have a right left pleural-based mass.  This was not noted 4 months ago, when we did a routine chest CT scan on her.  This mass measured 4.2 x 2 cm.  There was some mediastinal adenopathy measuring up to 1 cm.  She did have a biopsy.  The pathology report ([id number]) showed squamous cell carcinoma.  She has not had a PET scan yet.  She is having some pain issues.  We subsequently got her on some fentanyl patches.  This did seem to help.  She is on oxycodone for breakthrough pain, in addition to Ultram.  Her appetite is okay.  She is having no problems with cough.  There is no nausea or vomiting. There is no hemoptysis.  There is no leg swelling.  There are no problems with bowels or bladder.  She is chronically constipated.  PHYSICAL EXAMINATION:  General:  This is a well-developed, well- nourished white female in no obvious distress.  Vital signs: Temperature of 99, pulse is 68, respiratory rate 18, blood pressure 140/60.  Head and neck:  Normocephalic, atraumatic skull.  There are no ocular or oral lesions.  There are no palpable cervical or supraclavicular lymph nodes.  Lungs:  Clear bilaterally.  Cardiac: Regular rate and rhythm with a normal S1, S2.  There are no murmurs, rubs or bruits.  Abdomen:  Soft with good bowel sounds.  There is no palpable abdominal mass.  There is no palpable hepatosplenomegaly. Extremities:  No clubbing, cyanosis or edema.  Chest wall:  No swelling in the left lateral chest wall.  There is some tenderness to palpation in the region.  LABORATORY DATA:  Not done at this visit.  IMPRESSION:  Katelyn [name] is a nice 67 year old white female.   She had a resected squamous cell carcinoma of the left lung last year.  She had negative lymph nodes.  All margins were negative.  She had a tough time postop.  She was not a candidate for any adjuvant chemotherapy because of her performance status.  Unfortunately now she has recurrence.  We will get a PET scan set up on her.  We will see if she has metastatic disease elsewhere.  I think it is going to be hard to cure this.  We will certainly be able to treat it.  I think we need to treat with chemo and radiation therapy. I think this would be a reasonable approach to try to help maximize a response.  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  This was probably about 18 years ago now.  We will call Radiation Oncology.  Hopefully, we can get started with radiation next week or so.  I will put a Port-A-Cath into her.  I will go ahead and use weekly low- dose cisplatin.  I think this would be a reasonable approach to help maximize response.  I do not think we need a brain MRI.  I just feel bad for Katelyn [name].  I really thought that with her surgery for her lung cancer that she would be cured.  Again, we  will get treatment started on her probably the week of the 28th.  I will plan to see her back probably in about 3 or 4 weeks.    ______________________________ Katelyn [name], M.D. PRE/MEDQ  D:  01/15/2013  T:  01/15/2013  Job:  [id number]

## 2013-01-18 ENCOUNTER — Other Ambulatory Visit: Payer: Self-pay | Admitting: Hematology & Oncology

## 2013-01-18 ENCOUNTER — Ambulatory Visit (HOSPITAL_COMMUNITY)
Admission: RE | Admit: 2013-01-18 | Discharge: 2013-01-18 | Disposition: A | Discharge status: Home / Self Care | Payer: Medicare Other | Source: Ambulatory Visit | Attending: Hematology & Oncology | Admitting: Hematology & Oncology

## 2013-01-18 ENCOUNTER — Encounter (HOSPITAL_COMMUNITY): Payer: Self-pay

## 2013-01-18 DIAGNOSIS — Z85118 Personal history of other malignant neoplasm of bronchus and lung: Secondary | ICD-10-CM | POA: Insufficient documentation

## 2013-01-18 DIAGNOSIS — C3492 Malignant neoplasm of unspecified part of left bronchus or lung: Secondary | ICD-10-CM

## 2013-01-18 DIAGNOSIS — C792 Secondary malignant neoplasm of skin: Secondary | ICD-10-CM | POA: Insufficient documentation

## 2013-01-18 DIAGNOSIS — Z853 Personal history of malignant neoplasm of breast: Secondary | ICD-10-CM | POA: Insufficient documentation

## 2013-01-18 DIAGNOSIS — I739 Peripheral vascular disease, unspecified: Secondary | ICD-10-CM | POA: Insufficient documentation

## 2013-01-18 DIAGNOSIS — E039 Hypothyroidism, unspecified: Secondary | ICD-10-CM | POA: Insufficient documentation

## 2013-01-18 DIAGNOSIS — I10 Essential (primary) hypertension: Secondary | ICD-10-CM | POA: Insufficient documentation

## 2013-01-18 LAB — BASIC METABOLIC PANEL
CO2: 32 mEq/L (ref 19–32)
Calcium: 9.8 mg/dL (ref 8.4–10.5)
Chloride: 101 mEq/L (ref 96–112)
Creatinine, Ser: 1.14 mg/dL — ABNORMAL HIGH (ref 0.50–1.10)
Glucose, Bld: 101 mg/dL — ABNORMAL HIGH (ref 70–99)
Sodium: 138 mEq/L (ref 135–145)

## 2013-01-18 LAB — CBC
HCT: 40.8 % (ref 36.0–46.0)
MCH: 31.3 pg (ref 26.0–34.0)
MCV: 95.3 fL (ref 78.0–100.0)
RBC: 4.28 MIL/uL (ref 3.87–5.11)
WBC: 10.8 10*3/uL — ABNORMAL HIGH (ref 4.0–10.5)

## 2013-01-18 MED ORDER — MIDAZOLAM HCL 2 MG/2ML IJ SOLN
INTRAMUSCULAR | Status: AC
  Filled 2013-01-18: qty 4

## 2013-01-18 MED ORDER — MIDAZOLAM HCL 2 MG/2ML IJ SOLN
INTRAMUSCULAR | Status: AC | PRN
  Administered 2013-01-18: 2 mg via INTRAVENOUS

## 2013-01-18 MED ORDER — CEFAZOLIN SODIUM-DEXTROSE 2-3 GM-% IV SOLR
2.0000 g | Freq: Once | INTRAVENOUS | Status: AC
  Administered 2013-01-18: 2 g via INTRAVENOUS
  Filled 2013-01-18: qty 50

## 2013-01-18 MED ORDER — HEPARIN SOD (PORK) LOCK FLUSH 100 UNIT/ML IV SOLN
500.0000 [IU] | Freq: Once | INTRAVENOUS | Status: AC
  Administered 2013-01-18: 500 [IU] via INTRAVENOUS

## 2013-01-18 MED ORDER — SODIUM CHLORIDE 0.9 % IV SOLN: Freq: Once | INTRAVENOUS | Status: DC

## 2013-01-18 MED ORDER — FENTANYL CITRATE 0.05 MG/ML IJ SOLN
INTRAMUSCULAR | Status: AC | PRN
  Administered 2013-01-18: 100 ug via INTRAVENOUS

## 2013-01-18 MED ORDER — FENTANYL CITRATE 0.05 MG/ML IJ SOLN
INTRAMUSCULAR | Status: AC
  Filled 2013-01-18: qty 4

## 2013-01-18 NOTE — Procedures (Signed)
Successful placement of right IJ approach port-a-cath with tip at the superior caval atrial junction. The catheter is ready for immediate use. No immediate post procedural complications. 

## 2013-01-18 NOTE — H&P (Signed)
HPI: Katelyn [name] is an 67 y.o. female with prior history of both breast and lung cancer. She was found to have a new left chest wall mass. She underwent biopsy of this last week and the pathology has proven to be squamous cell lung cancer. She is to begin chemotherapy and possibly radiation as well. She is referred for port placement. She tolerated the biopsy well last week. PMHx  and meds reviewed.   Past Medical History:  Past Medical History  Diagnosis Date  . 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 Pulmonary nodule, left 08/26/2011  . Hypertension   . Restless leg syndrome   . Peripheral vascular disease   . Carotid artery stenosis     bilateral  . Glomus tumor      right  . Shortness of breath     exertional  . Hypothyroidism   . Cancer     breast, bilateral s/p mast and chemo  . Shoulder pain, left     due to vascular dz  . Left hip pain     when ambulatory  . Episodic low back pain   . Depression     stress related  . Hard of hearing     Past Surgical History:  Past Surgical History  Procedure Laterality Date  . Breast surgery  [id number]    bilateral mastectomy  . Portacath placement  1996    removed after chemo  . Vascular stent  2005    left subclavian  . Mastectomy      bilateral  . Tubal ligation    . Tonsillectomy      age 75  . Vascular surgery      Family History:  Family History  Problem Relation Age of Onset  . Anesthesia problems Neg Hx     Social History:  reports that she quit smoking about 18 months ago. Her smoking use included Cigarettes. She has a 40 pack-year smoking history. She quit smokeless tobacco use about 18 months ago. She reports that she does not drink alcohol or use illicit drugs.  Allergies: No Known Allergies  Medications:   Medication List    ASK your doctor about these medications       aspirin 325 MG tablet  Take 325 mg by mouth daily.     atorvastatin 40 MG tablet  Commonly known as:  LIPITOR  Take 40 mg  by mouth every morning.     CALCIUM & VIT D3 BONE HEALTH PO  Take by mouth every morning.     calcium carbonate 600 MG Tabs  Commonly known as:  OS-CAL  Take 600 mg by mouth daily with breakfast.     citalopram 40 MG tablet  Commonly known as:  CELEXA  Take 40 mg by mouth every morning.     fentaNYL 25 MCG/HR  Commonly known as:  DURAGESIC - dosed mcg/hr  Place 1 patch (25 mcg total) onto the skin every 3 (three) days.     gabapentin 300 MG capsule  Commonly known as:  NEURONTIN  Take 1 capsule (300 mg total) by mouth 3 (three) times daily.     ibuprofen 200 MG tablet  Commonly known as:  ADVIL,MOTRIN  Take 400 mg by mouth every 6 (six) hours as needed for pain.     levothyroxine 112 MCG tablet  Commonly known as:  SYNTHROID, LEVOTHROID  Take 112 mcg by mouth daily before breakfast.     lisinopril-hydrochlorothiazide 10-12.5 MG per tablet  Commonly known  as:  PRINZIDE,ZESTORETIC  Take 1 tablet by mouth every morning.     LORazepam 0.5 MG tablet  Commonly known as:  ATIVAN  Take 0.5 mg by mouth daily.     metoprolol tartrate 25 MG tablet  Commonly known as:  LOPRESSOR  Take 12.5 mg by mouth 2 (two) times daily. TAKES 1/2 TABLET     oxyCODONE 5 MG immediate release tablet  Commonly known as:  Oxy IR/ROXICODONE  Take 1-2 tablets (5-10 mg total) by mouth every 4 (four) hours as needed for pain.     pramipexole 0.125 MG tablet  Commonly known as:  MIRAPEX  Take 1 tablet (0.125 mg total) by mouth 3 (three) times daily.     traMADol 50 MG tablet  Commonly known as:  ULTRAM  Take 1 tablet (50 mg total) by mouth 2 (two) times daily as needed for pain.     traZODone 50 MG tablet  Commonly known as:  DESYREL  Take 1 tablet (50 mg total) by mouth at bedtime.     Vitamin D 2000 UNITS Caps  Take 2,000 Units by mouth daily.        Please HPI for pertinent positives, otherwise complete 10 system ROS negative.  Physical Exam: Temp: 97.2, BP: 136/75, HR: 67, RR:  16  General Appearance:  Alert, cooperative, no distress, appears stated age  Head:  Normocephalic, without obvious abnormality, atraumatic  ENT: Unremarkable  Neck: Supple, symmetrical, trachea midline  Lungs:   Clear to auscultation bilaterally, no w/r/r, respirations unlabored without use of accessory muscles.  Chest Wall:  Tender left posterior chest wall. Old port site scar right upper chest  Heart:  Regular rate and rhythm, S1, S2 normal, no murmur, rub or gallop.  Neurologic: Normal affect, no gross deficits.   Results for orders placed during the hospital encounter of 01/18/13 (from the past 48 hour(s))  BASIC METABOLIC PANEL     Status: Abnormal   Collection Time    01/18/13  9:45 AM      Result Value Range   Sodium 138  135 - 145 mEq/L   Potassium 3.9  3.5 - 5.1 mEq/L   Chloride 101  96 - 112 mEq/L   CO2 32  19 - 32 mEq/L   Glucose, Bld 101 (*) 70 - 99 mg/dL   BUN 15  6 - 23 mg/dL   Creatinine, Ser 1.61 (*) 0.50 - 1.10 mg/dL   Calcium 9.8  8.4 - 09.6 mg/dL   GFR calc non Af Amer 49 (*) >90 mL/min   GFR calc Af Amer 56 (*) >90 mL/min   Comment:            The eGFR has been calculated     using the CKD EPI equation.     This calculation has not been     validated in all clinical     situations.     eGFR's persistently     <90 mL/min signify     possible Chronic Kidney Disease.  CBC     Status: Abnormal   Collection Time    01/18/13  9:45 AM      Result Value Range   WBC 10.8 (*) 4.0 - 10.5 K/uL   RBC 4.28  3.87 - 5.11 MIL/uL   Hemoglobin 13.4  12.0 - 15.0 g/dL   HCT 04.5  40.9 - 81.1 %   MCV 95.3  78.0 - 100.0 fL   MCH 31.3  26.0 - 34.0 pg  MCHC 32.8  30.0 - 36.0 g/dL   RDW 16.1  09.6 - 04.5 %   Platelets 240  150 - 400 K/uL  PROTIME-INR     Status: None   Collection Time    01/18/13  9:45 AM      Result Value Range   Prothrombin Time 12.5  11.6 - 15.2 seconds   INR 0.95  0.00 - 1.49   No results found.  Assessment/Plan Recurrent lung cancer For  Port placement Explained procedure, risks, complications, use of sedation. Labs reviewed, ok Consent signed in chart  Brayton El PA-C 01/18/2013, 11:02 AM

## 2013-01-19 ENCOUNTER — Encounter: Payer: Self-pay | Admitting: Hematology & Oncology

## 2013-01-19 ENCOUNTER — Encounter: Payer: Self-pay | Admitting: *Deleted

## 2013-01-19 ENCOUNTER — Other Ambulatory Visit: Payer: Self-pay | Admitting: *Deleted

## 2013-01-19 DIAGNOSIS — C3490 Malignant neoplasm of unspecified part of unspecified bronchus or lung: Secondary | ICD-10-CM

## 2013-01-19 DIAGNOSIS — C3492 Malignant neoplasm of unspecified part of left bronchus or lung: Secondary | ICD-10-CM

## 2013-01-20 NOTE — Telephone Encounter (Signed)
Spoke with Dr. Myna Hidalgo about this. He wants patient to take off the Duragesic Patch and start taking the Oxycodone 1-2 tablets every 4 hours as needed.  Called to patti. Her Daughter.

## 2013-01-21 ENCOUNTER — Encounter (HOSPITAL_COMMUNITY)
Admission: RE | Admit: 2013-01-21 | Discharge: 2013-01-21 | Disposition: A | Discharge status: Home / Self Care | Payer: Medicare Other | Source: Ambulatory Visit | Attending: Hematology & Oncology | Admitting: Hematology & Oncology

## 2013-01-21 ENCOUNTER — Encounter (HOSPITAL_COMMUNITY): Payer: Self-pay

## 2013-01-21 ENCOUNTER — Other Ambulatory Visit: Payer: Medicare Other

## 2013-01-21 DIAGNOSIS — C3490 Malignant neoplasm of unspecified part of unspecified bronchus or lung: Secondary | ICD-10-CM | POA: Insufficient documentation

## 2013-01-21 DIAGNOSIS — C3492 Malignant neoplasm of unspecified part of left bronchus or lung: Secondary | ICD-10-CM

## 2013-01-21 LAB — GLUCOSE, CAPILLARY: Glucose-Capillary: 92 mg/dL (ref 70–99)

## 2013-01-21 MED ORDER — FLUDEOXYGLUCOSE F - 18 (FDG) INJECTION
17.8000 | Freq: Once | INTRAVENOUS | Status: AC | PRN
  Administered 2013-01-21: 17.8 via INTRAVENOUS

## 2013-01-22 ENCOUNTER — Other Ambulatory Visit: Payer: Self-pay | Admitting: *Deleted

## 2013-01-22 ENCOUNTER — Encounter: Payer: Self-pay | Admitting: Radiation Oncology

## 2013-01-22 ENCOUNTER — Telehealth: Payer: Self-pay | Admitting: Hematology & Oncology

## 2013-01-22 DIAGNOSIS — Z923 Personal history of irradiation: Secondary | ICD-10-CM | POA: Insufficient documentation

## 2013-01-22 DIAGNOSIS — C3490 Malignant neoplasm of unspecified part of unspecified bronchus or lung: Secondary | ICD-10-CM | POA: Insufficient documentation

## 2013-01-22 MED ORDER — PROCHLORPERAZINE MALEATE 10 MG PO TABS: 10.0000 mg | ORAL_TABLET | Freq: Four times a day (QID) | ORAL | Status: DC | PRN

## 2013-01-22 MED ORDER — LIDOCAINE-PRILOCAINE 2.5-2.5 % EX CREA: TOPICAL_CREAM | CUTANEOUS | Status: AC | PRN

## 2013-01-22 NOTE — Progress Notes (Signed)
Thoracic Location of Tumor / Histology: left lung  Patient presented 1 months ago with symptoms of: left sided chest wall pain, SOB, loss of appetite  Biopsies of left lung (if applicable) revealed:  Lung, needle/core biopsy(ies), left - POSITIVE FOR SQUAMOUS CELL CARCINOMA, SEE COMMENT.  Tobacco/Marijuana/Snuff/ETOH use: quit smoking 18 mos ago, cigarettes 40 pk yr, smokeless tobacco use- quit 18 months ago, no ETOH  Past/Anticipated interventions by cardiothoracic surgery, if any: none  Past/Anticipated interventions by medical oncology, if any:  Chemo to begin week of 01/25/13, Dr Myna Hidalgo  Signs/Symptoms  Weight changes, if any: stable  Respiratory complaints, if any: SOB w/minimal activity to exertion, uses O2 prn at home  Hemoptysis, if any: no, occasional dry cough  Pain issues, if any: left  Chest wall pain, takes Oxy-IR 5 mg, Tramadol prn 2-3 x daily w/good relief  SAFETY ISSUES:  Prior radiation? Yes, 06/04/95- 07/21/95: left chest wall, left axilla, supraclavicular area, Dr Dayton Scrape  Pacemaker/ICD? no  Possible current pregnancy? no  Is the patient on methotrexate? no  Current Complaints / other details:  Lives alone but family close by, appetite comes and goes, fatigued, chronic hoarseness and loss of taste since lung surgery 2013

## 2013-01-22 NOTE — Telephone Encounter (Signed)
Per RN Alvino Chapel moved 7-28 chemo to 7-31 pt is aware. Had received call from Clydie Braun Dr. Myna Hidalgo wanted them to see pt on Monday. Left message with Bjorn Loser to let Clydie Braun know we changed the chemo appointments

## 2013-01-25 ENCOUNTER — Ambulatory Visit
Admission: RE | Admit: 2013-01-25 | Discharge: 2013-01-25 | Disposition: A | Discharge status: Home / Self Care | Payer: Medicare Other | Source: Ambulatory Visit | Attending: Radiation Oncology | Admitting: Radiation Oncology

## 2013-01-25 ENCOUNTER — Encounter: Payer: Self-pay | Admitting: Radiation Oncology

## 2013-01-25 ENCOUNTER — Ambulatory Visit: Payer: Medicare Other

## 2013-01-25 DIAGNOSIS — C3490 Malignant neoplasm of unspecified part of unspecified bronchus or lung: Secondary | ICD-10-CM | POA: Insufficient documentation

## 2013-01-25 HISTORY — DX: Personal history of irradiation: Z92.3

## 2013-01-25 HISTORY — DX: Malignant neoplasm of unspecified site of unspecified female breast: C50.919

## 2013-01-25 HISTORY — DX: Malignant neoplasm of unspecified part of unspecified bronchus or lung: C34.90

## 2013-01-25 NOTE — Progress Notes (Signed)
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 female who is seen out of the courtesy of Dr. Arlan Organ for an opinion concerning radiation therapy as part of management of patient's recurrent non-small cell lung cancer.   the the patient has a prior history of inflammatory carcinoma of the left breast, glomus tumor and left lung cancer. The patient underwent definitive surgery for her non-small cell lung cancer last year and was doing well up until approximately 3 months ago when she began developing some pain in the left upper back region. The patient was actually being evaluated for laryngeal surgery at [facility] in light of her vocal cord paralysis when an additional imaging showed a questionable problem in the left lung.  a CT scan on July 8 in the [facility] showed left apical pleural thickening with destruction of the posterior left fifth rib. This mass measured 4.2 x 2 cm in size. A biopsy of this area revealed squamous cell carcinoma consistent with her prior history of lung cancer. A PET scan was performed which showed activity in this lesion with questionable activity in mediastinal and cervical nodes. In light of the patient's pain and x-ray findings she is now  seen in [facility] onc[facility] for consideration for palliative treatment.  PREVIOUS RADIATION THERAPY: Yes,  the patient received postmastectomy irradiation as part of management of  her inflammatory carcinoma of the left breast. Her treatments extending from 06/04/1995 through 07/21/1995 under the direction of Dr. Chipper Herb. The left chest wall and axillary as well as supraclavicular region received 45 gray. The mastectomy scar was boosted to 55 gray.  the patient also underwent gamma knife treatment for her glomus tumor involving the right ear,  this was one fraction with details pending.  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    PAST SURGICAL HISTORY: Past Surgical History  Procedure Laterality Date  . Breast surgery  4098,[id number]    bilateral mastectomy  . Portacath placement  1996    removed after chemo  . Vascular stent  01/07/2004    left subclavian  . Mastectomy      bilateral  . Tubal ligation    . Tonsillectomy      age 22  . Vascular surgery    . Lung cancer surgery Left 09/17/11    wedge resection, LUL  . Lung cancer surgery Left 09/21/11    lung resection    FAMILY HISTORY: family history includes Alzheimer's disease in her sister; Cancer in her brother and sister; and Heart attack in her mother.  There is no history of Anesthesia problems.  SOCIAL HISTORY:  reports that she quit smoking about 18 months ago. Her smoking use included Cigarettes. She has a 40 pack-year smoking history. She quit smokeless tobacco use about 18 months ago. She reports that she does not drink alcohol or use illicit drugs.  ALLERGIES: Review of patient's allergies indicates no known allergies.  MEDICATIONS:  Current Outpatient Prescriptions    Medication Sig Dispense Refill  . aspirin 325 MG tablet Take 325 mg by mouth daily.      Marland Kitchen atorvastatin (LIPITOR) 40 MG tablet Take 40 mg by mouth every morning.      . calcium carbonate (OS-CAL) 600 MG TABS Take 600 mg by mouth daily with breakfast.      . Cholecalciferol (VITAMIN D) 2000 UNITS CAPS Take 2,000 Units by mouth daily.       . citalopram (CELEXA) 40 MG tablet Take 40 mg by mouth every morning.      . gabapentin (NEURONTIN) 300 MG capsule Take 1 capsule (300 mg total) by mouth 3 (three) times daily.  90 capsule  2  . ibuprofen (ADVIL,MOTRIN) 200 MG tablet Take 400 mg by mouth every 6 (six) hours as needed for pain.      Marland Kitchen levothyroxine (SYNTHROID, LEVOTHROID) 112 MCG tablet Take 112 mcg by mouth daily before breakfast.      . lidocaine-prilocaine (EMLA) cream Apply topically as needed. Apply quarter sized amount onto site of portacath 1-1 1/2 hours prior to using portacath.  30 g  6  . lisinopril-hydrochlorothiazide (PRINZIDE,ZESTORETIC) 10-12.5 MG per tablet Take 1 tablet by mouth every morning.      Marland Kitchen LORazepam (ATIVAN) 0.5 MG tablet Take 0.5 mg by mouth daily.      . metoprolol tartrate (LOPRESSOR) 25 MG tablet Take 12.5 mg by mouth 2 (two) times daily. TAKES 1/2 TABLET      . Multiple Minerals-Vitamins (CALCIUM & VIT D3 BONE HEALTH PO) Take by mouth every morning.      Marland Kitchen oxyCODONE (OXY IR/ROXICODONE) 5 MG immediate release tablet Take 1-2 tablets (5-10 mg total) by mouth every 4 (four) hours as needed for pain.  60 tablet  0  . pramipexole (MIRAPEX) 0.125 MG tablet Take 1 tablet (0.125 mg total) by mouth 3 (three) times daily.  90 tablet  5  . prochlorperazine (COMPAZINE) 10 MG tablet Take 1 tablet (10 mg total) by mouth every 6 (six) hours as needed.  30 tablet  3  . traMADol (ULTRAM) 50 MG tablet Take 1 tablet (50 mg total) by mouth 2 (two) times daily as needed for pain.  60 tablet  1  . traZODone (DESYREL) 50 MG tablet Take 1 tablet (50 mg total) by mouth at bedtime.  30  tablet  5  . fentaNYL (DURAGESIC - DOSED MCG/HR) 25 MCG/HR Place 1 patch (25 mcg total) onto the skin every 3 (three) days.  10 patch  0   No current facility-administered medications for this encounter.    REVIEW OF SYSTEMS:  A 15 point review of systems is documented in the electronic medical record. This was obtained by the nursing staff. However, I reviewed this with the patient to discuss relevant findings and make appropriate changes. She complains of pain in the left upper back radiating into her left lateral rib cage and left lower anterior chest region.  she denies any headaches or visual problems. She denies any other areas of pain. The patient denies any hemoptysis. She does complain of generalized fatigue. She does use supplemental oxygen since her lung cancer surgery when she is active and does sleep with supplemental oxygen in place.   PHYSICAL EXAM:  weight is 168 lb 3.2 oz (76.295 kg). Her oral temperature is 98 F (36.7 C). Her blood pressure is 133/50 and her pulse is 66. Her respiration is 20.   BP 133/50  Pulse 66  Temp(Src) 98 F (36.7 C) (Oral)  Resp 20  Wt 168 lb 3.2 oz (76.295 kg)  BMI 27.16 kg/m2  General Appearance:    Alert, cooperative, no distress, appears stated age, accompanied by a daughter   Head:    Normocephalic, without obvious abnormality, atraumatic  Eyes:    PERRL, conjunctiva/corneas clear, EOM's intact    Ears:    Normal TM and external ear canal on the left side. The right tympanic membrane shows along the inferior portion a fleshy erythematous lesion consistent with glomus tumor   Nose:   Nares normal, septum midline, mucosa normal, no drainage    or sinus tenderness  Throat:   Lips, mucosa, and tongue normal;  gums normal  Neck:   Supple, symmetrical, trachea midline, no adenopathy;    thyroid:  no enlargement/tenderness/nodules; no carotid   bruit or JVD  Back:     Symmetric, no curvature, ROM normal, no CVA tenderness  Lungs:     decreased  breath sounds noted in the left lower lung field   Chest Wall:    bilateral mastectomy scars, some radiation changes noted in the left chest wall and supraclavicular region, tattoos in place from prior radiation therapy,  second scar in the left lateral chest is noted from her lung cancer, Port-A-Cath present in the right upper chest region    Heart:    Regular rate and rhythm, S1 and S2 normal, no murmur, rub   or gallop     Abdomen:     Soft, non-tender, bowel sounds active all four quadrants,    no masses, no organomegaly        Extremities:   Extremities normal, atraumatic, no cyanosis or edema  Pulses:   2+ and symmetric all extremities  Skin:   Skin color, texture, turgor normal, no rashes or lesions  Lymph nodes:   Cervical, supraclavicular, and axillary nodes normal  Neurologic:   normal strength, sensation and reflexes    throughout    LABORATORY DATA:  Lab Results  Component Value Date   WBC 10.8* 01/18/2013   HGB 13.4 01/18/2013   HCT 40.8 01/18/2013   MCV 95.3 01/18/2013   PLT 240 01/18/2013   Lab Results  Component Value Date   NA 138 01/18/2013   K 3.9 01/18/2013   CL 101 01/18/2013   CO2 32 01/18/2013  Lab Results  Component Value Date   ALT 9 01/07/2013   AST 15 01/07/2013   ALKPHOS 82 01/07/2013   BILITOT 0.4 01/07/2013     RADIOGRAPHY: Dg Chest 2 View  01/05/2013   *RADIOLOGY REPORT*  Clinical Data: History of chest pain. History of lung cancer. History of previous partial left pneumonectomy.  CHEST - 2 VIEW  Comparison: 12/11/2011.  CT 09/09/2012.  Findings: The rotation of scoliosis convexity to the left is again present.  Cardiac silhouette is borderline size. Ectasia and nonaneurysmal calcification of the thoracic aorta are seen. Noncalcific pleural thickening, fibrosis, volume loss in the left upper lobe is seen with elevation of the left hilum.  Surgical clips are seen in the left axillary region. There is a left subclavian stent.  There is chronic elevation of the  left hemidiaphragm.  There is granuloma calcification in the right upper lobe.  No active granulomatous process is seen. No adenopathy or pleural effusion is seen.  No pulmonary nodules or masses are evident.  There is osteopenic appearance of the bones.  Changes of degenerative disc disease and degenerative spondylosis are present. No blastic or lytic disease is evident.  IMPRESSION: No acute or active pleural or parenchymal abnormalities are identified.  Chronic findings are discussed in detail above.  Clinically significant discrepancy from primary report, if provided: None   Original Report Authenticated By: Onalee Hua Call   Ct Angio Chest W/cm &/or Wo Cm  01/05/2013   *RADIOLOGY REPORT*  Clinical Data: Shortness of breath with pain in the left chest wall. History of breast cancer post mastectomy.  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  CT ANGIOGRAPHY CHEST  Technique:  Multidetector CT imaging of the chest using the standard protocol during bolus administration of intravenous contrast. Multiplanar reconstructed images including MIPs were obtained and reviewed to evaluate the vascular anatomy.  Contrast: OMNIPAQUE IOHEXOL 350 MG/ML SOLN  Comparison: CT chest 09/09/2012  Findings: Technically adequate study with good opacification of the central and segmental pulmonary arteries.  No focal filling defects.  No evidence of significant pulmonary embolus. Postoperative changes consistent with left thoracotomy and partial pneumonectomy.  Stable left apical scarring is probably postoperative.  Left apical pleural thickening with destruction of the posterior left rib, likely the fifth rib.  This is progressing since the previous study and suggests pleural metastasis invading bony versus bony metastasis extending into the pleura.  The mass is measured at about 4.2 x 2 cm.  Pretracheal and subcarinal lymphadenopathy with lymph nodes measuring up to about 10 mm short axis dimension.  This  appears stable since the previous study. Metastatic disease is not excluded.  Stable 8 mm nodule in the right middle lung inferiorly.  New focal airspace consolidation in the right lower lung posteriorly could relate to pneumonia.  Cardiac enlargement.  Coronary artery calcification.  Calcification of thoracic aorta without aneurysm.  Esophagus is decompressed. Surgical clips in the mediastinum.  Postoperative changes with left mastectomy and surgical clips in the axilla.  Emphysematous changes in the lungs.  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 New infiltration in the right lower lung may represent pneumonia.  Additional postoperative and chronic changes as described above.   Original Report Authenticated By: Burman Nieves, M.D.   Nm Pet Image Restag (ps) Skull Base To Thigh  01/21/2013   **ADDENDUM** CREATED: 01/21/2013 12:48:29  As described in the findings, hypermetabolism in the  right lobe of the thyroid, without dominant nodule or mass.  Consider further characterization with thyroid ultrasound.  **END ADDENDUM** SIGNED BY: Karn Cassis. Reche Dixon, M.D.  01/21/2013   *RADIOLOGY REPORT*  Clinical Data: Subsequent treatment strategy for restaging lung cancer.  Squamous cell carcinoma of left lung.  NUCLEAR MEDICINE PET SKULL BASE TO THIGH  Fasting Blood Glucose:  92  Technique:  17.8 mCi F-18 FDG was injected intravenously. CT data was obtained and used for attenuation correction and anatomic localization only.  (This was not acquired as a diagnostic CT examination.) Additional exam technical data entered on technologist worksheet.  Comparison:  Chest CT 01/05/2013.  No prior PET.  Findings:  Neck: Extensive muscular activity throughout the neck.  Equivocal hypermetabolism within the left side of the neck in the region of a 6 mm level II - III node on image 34/series 2.  A right sided level II node measures 1.1 cm and a S.U.V.  max of 4.8 on image 27/series 2.  This maintains its fatty hilum.   Hypermetabolism within the right lobe of the thyroid.  No well-defined correlate nodule on CT. This measures a S.U.V. max of 5.3 on image 49/series 2.  Chest:  Hypermetabolism corresponding to the area of posterior left chest wall mass described previously.  This measures 2.8 x 4.2 cm and a S.U.V. max of 23.4 on image 66/series 2.  Scattered throughout the remainder of the left lung are foci of hypermetabolism.  The majority of these are without CT correlate. There is an area of vague interstitial opacity within the lateral left lung on image 76/series 2.  This is in the vicinity of hypermetabolism, which measures a S.U.V. max of 3.2 in this area. An area of more inferior left sided pleural based hypermetabolism with minimal pleural thickening.  This is nonspecific.  Measures a S.U.V. max of 3.0 on image 83/series 2.  A prevascular node which measures 7 mm nonea S.U.V. max of 3.1 on image 66/series 2.  This node appears similar in size back to the 05/05/2012 CT.  Abdomen/Pelvis:  No abnormal hypermetabolism.  Skeleton:  Direct involvement of the fifth posterior left rib by the left chest wall tumor.  No other foci of abnormal marrow activity.  CT  images performed for attenuation correction demonstrate no further findings within the neck.  Chest findings deferred to recent diagnostic CT.  Probable stone versus tiny hyperattenuating complex cyst in the lower pole right kidney.  Aortic atherosclerosis.  IMPRESSION:  1.  Posterior left chest wall metastasis, as described previously. 2.  Foci of left-sided pulmonary parenchymal hypermetabolism which are without definite CT correlate.  Favored to be post infectious or inflammatory. 3.  A prevascular node is stable in size to mildly hypermetabolic. This warrants followup attention. 4.  Muscular activity within the neck.  Bilateral mildly hypermetabolic cervical nodes.  These are indeterminate.  These can  be reevaluated at follow-up. 5.  No evidence of subdiaphragmatic metastasis.  Original Report Authenticated By: Jeronimo Greaves, M.D.   Ct Biopsy  01/08/2013   *RADIOLOGY REPORT*  Clinical Data: History of lung and breast carcinoma with new posterior left chest wall and pleural mass.  CT GUIDED CORE BIOPSY OF PLEURAL MASS  Sedation:   1.5 mg IV Versed;  100 mcg IV Fentanyl  Total Moderate Sedation Time: 12 minutes.  Procedure:  The procedure risks, benefits, and alternatives were explained to the patient.  Questions regarding the procedure were encouraged and answered.  The patient understands and consents  to the procedure.  The left posterior chest wall was prepped with Betadine in a sterile fashion, and a sterile drape was applied covering the operative field.  A sterile gown and sterile gloves were used for the procedure.  Local anesthesia was provided with 1% Lidocaine.  CT was performed in a prone position to localize the posterior left chest wall mass.  Under CT guidance, a 17 gauge needle was advanced from a posterior approach to the level of the mass.  Coaxial 18- gauge core biopsy samples were obtained.  Three separate core samples were submitted in formalin.  Postbiopsy imaging was performed by CT.  Complications: None.  No pneumothorax.  Findings: Posterior left chest wall mass was isolated which is causing visible destruction of a segment of the left fifth rib. Solid tissue was obtained from the lesion.  There were no immediate complications.  IMPRESSION: CT guided core biopsy performed of the left posterior pleural/chest wall mass.   Original Report Authenticated By: Irish Lack, M.D.      IMPRESSION: Recurrent squamous cell carcinoma of the lung. Patient would be a good candidate for palliative radiation therapy in light of her pain and x-ray findings. This will be important in limiting tumor progression which eventually could erode into the spinal cord resulting in paralysis. I discussed with the  patient that given her  destructive lesion along the chest wall area she would not be a candidate for curative treatment but  could be helped with her anticipated radiation and radiosensitizing chemotherapy.  PLAN: Simulation and planning tomorrow with treatments to begin early next week. The patient will be treated with intensity modulated radiation therapy to limit overlap with her previous left chest wall radiation therapy. Patient will begin her chemotherapy later this week.  I spent 60 minutes minutes face to face with the patient and more than 50% of that time was spent in counseling and/or coordination of care.   ------------------------------------------------ Special treatment procedure note  The patient's previous radiation therapy was carefully reviewed as it relates to her current problem. Given the additional time in reviewing this prior treatment as relates to her current set up and the potential for increased toxicities, this constitutes a special treatment procedure. -----------------------------------  Billie Lade, PhD, MD

## 2013-01-25 NOTE — Progress Notes (Signed)
Please see the Nurse Progress Note in the MD Initial Consult Encounter for this patient. 

## 2013-01-26 ENCOUNTER — Encounter: Payer: Self-pay | Admitting: *Deleted

## 2013-01-26 ENCOUNTER — Ambulatory Visit
Admission: RE | Admit: 2013-01-26 | Discharge: 2013-01-26 | Disposition: A | Discharge status: Home / Self Care | Payer: Medicare Other | Source: Ambulatory Visit | Attending: Radiation Oncology | Admitting: Radiation Oncology

## 2013-01-26 DIAGNOSIS — Z510 Encounter for antineoplastic radiation therapy: Secondary | ICD-10-CM | POA: Insufficient documentation

## 2013-01-26 DIAGNOSIS — R5381 Other malaise: Secondary | ICD-10-CM | POA: Insufficient documentation

## 2013-01-26 DIAGNOSIS — Z79899 Other long term (current) drug therapy: Secondary | ICD-10-CM | POA: Insufficient documentation

## 2013-01-26 DIAGNOSIS — R490 Dysphonia: Secondary | ICD-10-CM | POA: Insufficient documentation

## 2013-01-26 DIAGNOSIS — C3490 Malignant neoplasm of unspecified part of unspecified bronchus or lung: Secondary | ICD-10-CM | POA: Insufficient documentation

## 2013-01-26 DIAGNOSIS — R0789 Other chest pain: Secondary | ICD-10-CM | POA: Insufficient documentation

## 2013-01-27 NOTE — Progress Notes (Signed)
  Radiation Oncology         (336) [phone] ________________________________  Name: Katelyn [name] MRN: [id number]  Date: 01/26/2013  DOB: 1945/07/17  SIMULATION AND TREATMENT PLANNING NOTE  DIAGNOSIS:  Recurrent non-small cell lung cancer  NARRATIVE:  The patient was brought to the CT Simulation planning suite.  Identity was confirmed.  All relevant records and images related to the planned course of therapy were reviewed.  The patient freely provided informed written consent to proceed with treatment after reviewing the details related to the planned course of therapy. The consent form was witnessed and verified by the simulation staff.  Then, the patient was set-up in a stable reproducible  supine position for radiation therapy.  CT images were obtained.  Surface markings were placed.  The CT images were loaded into the planning software.  Then the target and avoidance structures were contoured.  Treatment planning then occurred.  The radiation prescription was entered and confirmed.  Then, I designed and supervised the construction of a total of 1 medically necessary complex treatment devices.  I have requested : Intensity Modulated Radiotherapy (IMRT) is medically necessary for this case for the following reason:  Previous treatment to this area..  I have ordered:dose calc.  PLAN:  The patient will receive 50 Gy in 25 fractions along with radiosensitizing chemotherapy.  ________________________________  -----------------------------------  Billie Lade, PhD, MD

## 2013-01-28 ENCOUNTER — Encounter: Payer: Self-pay | Admitting: Hematology & Oncology

## 2013-01-28 ENCOUNTER — Ambulatory Visit (HOSPITAL_BASED_OUTPATIENT_CLINIC_OR_DEPARTMENT_OTHER): Payer: Medicare Other

## 2013-01-28 ENCOUNTER — Other Ambulatory Visit: Payer: Self-pay | Admitting: Oncology

## 2013-01-28 ENCOUNTER — Ambulatory Visit: Payer: Medicare Other

## 2013-01-28 VITALS — BP 97/50 | HR 64 | Temp 96.9°F | Resp 20 | Wt 167.0 lb

## 2013-01-28 DIAGNOSIS — C3410 Malignant neoplasm of upper lobe, unspecified bronchus or lung: Secondary | ICD-10-CM

## 2013-01-28 DIAGNOSIS — Z5111 Encounter for antineoplastic chemotherapy: Secondary | ICD-10-CM

## 2013-01-28 DIAGNOSIS — C3492 Malignant neoplasm of unspecified part of left bronchus or lung: Secondary | ICD-10-CM

## 2013-01-28 MED ORDER — SODIUM CHLORIDE 0.9 % IV SOLN
Freq: Once | INTRAVENOUS | Status: AC
  Administered 2013-01-28: 09:00:00 via INTRAVENOUS

## 2013-01-28 MED ORDER — SODIUM CHLORIDE 0.9 % IV SOLN
150.0000 mg | Freq: Once | INTRAVENOUS | Status: AC
  Administered 2013-01-28: 150 mg via INTRAVENOUS
  Filled 2013-01-28: qty 5

## 2013-01-28 MED ORDER — DEXAMETHASONE SODIUM PHOSPHATE 20 MG/5ML IJ SOLN
12.0000 mg | Freq: Once | INTRAMUSCULAR | Status: AC
  Administered 2013-01-28: 12 mg via INTRAVENOUS
  Filled 2013-01-28: qty 5

## 2013-01-28 MED ORDER — POTASSIUM CHLORIDE 2 MEQ/ML IV SOLN
Freq: Once | INTRAVENOUS | Status: AC
  Administered 2013-01-28: 09:00:00 via INTRAVENOUS
  Filled 2013-01-28: qty 10

## 2013-01-28 MED ORDER — SODIUM CHLORIDE 0.9 % IV SOLN
40.0000 mg/m2 | Freq: Once | INTRAVENOUS | Status: AC
  Administered 2013-01-28: 75 mg via INTRAVENOUS
  Filled 2013-01-28: qty 75

## 2013-01-28 MED ORDER — DEXAMETHASONE 4 MG PO TABS: ORAL_TABLET | ORAL | Status: DC

## 2013-01-28 MED ORDER — OXYCODONE HCL 10 MG PO TB12: 10.0000 mg | ORAL_TABLET | Freq: Two times a day (BID) | ORAL | Status: AC

## 2013-01-28 MED ORDER — HEPARIN SOD (PORK) LOCK FLUSH 100 UNIT/ML IV SOLN
500.0000 [IU] | Freq: Once | INTRAVENOUS | Status: AC | PRN
  Administered 2013-01-28: 500 [IU]
  Filled 2013-01-28: qty 5

## 2013-01-28 MED ORDER — SODIUM CHLORIDE 0.9 % IJ SOLN
10.0000 mL | INTRAMUSCULAR | Status: DC | PRN
  Administered 2013-01-28: 10 mL
  Filled 2013-01-28: qty 10

## 2013-01-28 MED ORDER — ONDANSETRON HCL 8 MG PO TABS: ORAL_TABLET | ORAL | Status: DC

## 2013-01-28 MED ORDER — PALONOSETRON HCL INJECTION 0.25 MG/5ML
0.2500 mg | Freq: Once | INTRAVENOUS | Status: AC
  Administered 2013-01-28: 0.25 mg via INTRAVENOUS

## 2013-01-28 NOTE — Patient Instructions (Signed)
[facility] Discharge Instructions for Patients Receiving Chemotherapy  Today you received chemotherapy agent:  Cisplatin We have called in 2 prescriptions-Decadron and Zofran. Please take as directed on bottle.  You also have Compazine and Ativan at home, you can take as directed for nausea also.    To help prevent nausea and vomiting after your treatment, we encourage you to take your nausea medication as prescribed.  We have called  If you develop nausea and vomiting that is not controlled by your nausea medication, call the clinic. If it is after clinic hours your family physician or the after hours number for the clinic or go to the Emergency Department.   BELOW ARE SYMPTOMS THAT SHOULD BE REPORTED IMMEDIATELY:  *FEVER GREATER THAN 100.5 F  *CHILLS WITH OR WITHOUT FEVER  NAUSEA AND VOMITING THAT IS NOT CONTROLLED WITH YOUR NAUSEA MEDICATION  *UNUSUAL SHORTNESS OF BREATH  *UNUSUAL BRUISING OR BLEEDING  TENDERNESS IN MOUTH AND THROAT WITH OR WITHOUT PRESENCE OF ULCERS  *URINARY PROBLEMS  *BOWEL PROBLEMS  UNUSUAL RASH Items with * indicate a potential emergency and should be followed up as soon as possible.   Please let the nurse know about any problems that you may have experienced. Feel free to call the clinic you have any questions or concerns. The clinic phone number is [phone].   I have been informed and understand all the instructions given to me. I know to contact the clinic, my physician, or go to the Emergency Department if any problems should occur. I do not have any questions at this time, but understand that I may call the clinic during office hours   should I have any questions or need assistance in obtaining follow up care.    __________________________________________  _____________  __________ Signature of Patient or Authorized Representative            Date                   Time    __________________________________________ Nurse's  Signature   Cisplatin injection What is this medicine? CISPLATIN (SIS pla tin) is a chemotherapy drug. It targets fast dividing cells, like cancer cells, and causes these cells to die. This medicine is used to treat many types of cancer like bladder, ovarian, and testicular cancers. This medicine may be used for other purposes; ask your health care provider or pharmacist if you have questions. What should I tell my health care provider before I take this medicine? They need to know if you have any of these conditions: -blood disorders -hearing problems -kidney disease -recent or ongoing radiation therapy -an unusual or allergic reaction to cisplatin, carboplatin, other chemotherapy, other medicines, foods, dyes, or preservatives -pregnant or trying to get pregnant -breast-feeding How should I use this medicine? This drug is given as an infusion into a vein. It is administered in a hospital or clinic by a specially trained health care professional. Talk to your pediatrician regarding the use of this medicine in children. Special care may be needed. Overdosage: If you think you have taken too much of this medicine contact a poison control center or emergency room at once. NOTE: This medicine is only for you. Do not share this medicine with others. What if I miss a dose? It is important not to miss a dose. Call your doctor or health care professional if you are unable to keep an appointment. What may interact with this medicine? -dofetilide -foscarnet -medicines for seizures -medicines to  increase blood counts like filgrastim, pegfilgrastim, sargramostim -probenecid -pyridoxine used with altretamine -rituximab -some antibiotics like amikacin, gentamicin, neomycin, polymyxin B, streptomycin, tobramycin -sulfinpyrazone -vaccines -zalcitabine Talk to your doctor or health care professional before taking any of these  medicines: -acetaminophen -aspirin -ibuprofen -ketoprofen -naproxen This list may not describe all possible interactions. Give your health care provider a list of all the medicines, herbs, non-prescription drugs, or dietary supplements you use. Also tell them if you smoke, drink alcohol, or use illegal drugs. Some items may interact with your medicine. What should I watch for while using this medicine? Your condition will be monitored carefully while you are receiving this medicine. You will need important blood work done while you are taking this medicine. This drug may make you feel generally unwell. This is not uncommon, as chemotherapy can affect healthy cells as well as cancer cells. Report any side effects. Continue your course of treatment even though you feel ill unless your doctor tells you to stop. In some cases, you may be given additional medicines to help with side effects. Follow all directions for their use. Call your doctor or health care professional for advice if you get a fever, chills or sore throat, or other symptoms of a cold or flu. Do not treat yourself. This drug decreases your body's ability to fight infections. Try to avoid being around people who are sick. This medicine may increase your risk to bruise or bleed. Call your doctor or health care professional if you notice any unusual bleeding. Be careful brushing and flossing your teeth or using a toothpick because you may get an infection or bleed more easily. If you have any dental work done, tell your dentist you are receiving this medicine. Avoid taking products that contain aspirin, acetaminophen, ibuprofen, naproxen, or ketoprofen unless instructed by your doctor. These medicines may hide a fever. Do not become pregnant while taking this medicine. Women should inform their doctor if they wish to become pregnant or think they might be pregnant. There is a potential for serious side effects to an unborn child. Talk to  your health care professional or pharmacist for more information. Do not breast-feed an infant while taking this medicine. Drink fluids as directed while you are taking this medicine. This will help protect your kidneys. Call your doctor or health care professional if you get diarrhea. Do not treat yourself. What side effects may I notice from receiving this medicine? Side effects that you should report to your doctor or health care professional as soon as possible: -allergic reactions like skin rash, itching or hives, swelling of the face, lips, or tongue -signs of infection - fever or chills, cough, sore throat, pain or difficulty passing urine -signs of decreased platelets or bleeding - bruising, pinpoint red spots on the skin, black, tarry stools, nosebleeds -signs of decreased red blood cells - unusually weak or tired, fainting spells, lightheadedness -breathing problems -changes in hearing -gout pain -low blood counts - This drug may decrease the number of white blood cells, red blood cells and platelets. You may be at increased risk for infections and bleeding. -nausea and vomiting -pain, swelling, redness or irritation at the injection site -pain, tingling, numbness in the hands or feet -problems with balance, movement -trouble passing urine or change in the amount of urine Side effects that usually do not require medical attention (report to your doctor or health care professional if they continue or are bothersome): -changes in vision -loss of appetite -metallic taste in the  mouth or changes in taste This list may not describe all possible side effects. Call your doctor for medical advice about side effects. You may report side effects to FDA at 1-800-FDA-1088. Where should I keep my medicine? This drug is given in a hospital or clinic and will not be stored at home. NOTE: This sheet is a summary. It may not cover all possible information. If you have questions about this medicine,  talk to your doctor, pharmacist, or health care provider.  2012, [employer]/Gold Standard. (09/22/2007 2:40:54 PM)

## 2013-01-29 ENCOUNTER — Encounter: Payer: Self-pay | Admitting: Oncology

## 2013-01-29 NOTE — Progress Notes (Signed)
24 Hour Chemotherapy Follow up Call  Katelyn [name] called at home following administration of chemotherapy. Patient reports no nausea or side effects.   Following interventions recommended - take nausea meds as prescribed.  Reviewed all post chemotherapy instructions with patient and when should call clinic or MD.  Patient verbalized understanding.

## 2013-02-01 ENCOUNTER — Other Ambulatory Visit: Payer: Medicare Other | Admitting: Lab

## 2013-02-01 ENCOUNTER — Ambulatory Visit: Payer: Medicare Other

## 2013-02-02 ENCOUNTER — Ambulatory Visit: Payer: Medicare Other | Admitting: Radiation Oncology

## 2013-02-02 ENCOUNTER — Ambulatory Visit: Payer: Medicare Other

## 2013-02-02 ENCOUNTER — Ambulatory Visit
Admission: RE | Admit: 2013-02-02 | Discharge: 2013-02-02 | Disposition: A | Discharge status: Home / Self Care | Payer: Medicare Other | Source: Ambulatory Visit | Attending: Radiation Oncology | Admitting: Radiation Oncology

## 2013-02-02 ENCOUNTER — Encounter: Payer: Self-pay | Admitting: Radiation Oncology

## 2013-02-02 NOTE — Progress Notes (Signed)
Weekly Management Note Current Dose: 2  Gy  Projected Dose: 50 Gy   Narrative:  The patient presents for routine under treatment assessment.  CBCT/MVCT images/Port film x-rays were reviewed.  The chart was checked. Had first treatment. I reviewed her conebeam CT on the machine. She is having more pain in her left chest wall especially with her seatbelt or when she lays on that side. Pain controlled with oxycontin and 1 prn oxycodone. Hoarseness is stable.   Physical Findings: Weight: 163 lb 11.2 oz (74.254 kg). Unchanged  Impression:  The patient is tolerating radiation.  Plan:  Continue treatment as planned.RN education performed.

## 2013-02-02 NOTE — Progress Notes (Addendum)
Weekly rad txs, 1st / 25 Lt chest, patient has hoarseness, whispers, sob at times, no coughing, sob at times stated, 96% room air, patient education, done, radiation therapy and you book, radiaplex gel given, does have difficulty swallowing liquids at times, but getting over being intubated and voice box repaired stated family mem, discussed fatigue, skin irriitation, pain, thorat discomfot, may need to have smaller meals, soft foods, all questions answered at this time, Eloisa Northern RN, business card given to patient and calendar, pain a level 3 on 1-10 scale, taking oxycontin 10mg  q 12hours,,chemotherapy last week,   12:36 PM

## 2013-02-03 ENCOUNTER — Other Ambulatory Visit: Payer: Self-pay | Admitting: *Deleted

## 2013-02-03 ENCOUNTER — Ambulatory Visit
Admission: RE | Admit: 2013-02-03 | Discharge: 2013-02-03 | Disposition: A | Discharge status: Home / Self Care | Payer: Medicare Other | Source: Ambulatory Visit | Attending: Radiation Oncology | Admitting: Radiation Oncology

## 2013-02-03 DIAGNOSIS — C3492 Malignant neoplasm of unspecified part of left bronchus or lung: Secondary | ICD-10-CM

## 2013-02-04 ENCOUNTER — Ambulatory Visit (HOSPITAL_BASED_OUTPATIENT_CLINIC_OR_DEPARTMENT_OTHER): Payer: Medicare Other

## 2013-02-04 ENCOUNTER — Other Ambulatory Visit (HOSPITAL_BASED_OUTPATIENT_CLINIC_OR_DEPARTMENT_OTHER): Payer: Medicare Other | Admitting: Lab

## 2013-02-04 ENCOUNTER — Ambulatory Visit
Admission: RE | Admit: 2013-02-04 | Discharge: 2013-02-04 | Disposition: A | Discharge status: Home / Self Care | Payer: Medicare Other | Source: Ambulatory Visit | Attending: Radiation Oncology | Admitting: Radiation Oncology

## 2013-02-04 DIAGNOSIS — C3410 Malignant neoplasm of upper lobe, unspecified bronchus or lung: Secondary | ICD-10-CM

## 2013-02-04 DIAGNOSIS — C3492 Malignant neoplasm of unspecified part of left bronchus or lung: Secondary | ICD-10-CM

## 2013-02-04 DIAGNOSIS — Z5111 Encounter for antineoplastic chemotherapy: Secondary | ICD-10-CM

## 2013-02-04 LAB — CBC WITH DIFFERENTIAL (CANCER CENTER ONLY)
BASO%: 0.1 % (ref 0.0–2.0)
LYMPH#: 3.1 10*3/uL (ref 0.9–3.3)
LYMPH%: 19.3 % (ref 14.0–48.0)
MCV: 95 fL (ref 81–101)
MONO#: 1.8 10*3/uL — ABNORMAL HIGH (ref 0.1–0.9)
Platelets: 248 10*3/uL (ref 145–400)
RDW: 12.9 % (ref 11.1–15.7)
WBC: 16.2 10*3/uL — ABNORMAL HIGH (ref 3.9–10.0)

## 2013-02-04 LAB — BASIC METABOLIC PANEL - CANCER CENTER ONLY
CO2: 34 mEq/L — ABNORMAL HIGH (ref 18–33)
Calcium: 9.9 mg/dL (ref 8.0–10.3)
Sodium: 136 mEq/L (ref 128–145)

## 2013-02-04 MED ORDER — POTASSIUM CHLORIDE 2 MEQ/ML IV SOLN
Freq: Once | INTRAVENOUS | Status: AC
  Administered 2013-02-04: 10:00:00 via INTRAVENOUS
  Filled 2013-02-04: qty 10

## 2013-02-04 MED ORDER — SODIUM CHLORIDE 0.9 % IV SOLN
Freq: Once | INTRAVENOUS | Status: AC
  Administered 2013-02-04: 10:00:00 via INTRAVENOUS

## 2013-02-04 MED ORDER — HEPARIN SOD (PORK) LOCK FLUSH 100 UNIT/ML IV SOLN
500.0000 [IU] | Freq: Once | INTRAVENOUS | Status: DC | PRN
  Filled 2013-02-04: qty 5

## 2013-02-04 MED ORDER — DEXAMETHASONE SODIUM PHOSPHATE 20 MG/5ML IJ SOLN
12.0000 mg | Freq: Once | INTRAMUSCULAR | Status: AC
  Administered 2013-02-04: 12 mg via INTRAVENOUS
  Filled 2013-02-04: qty 5

## 2013-02-04 MED ORDER — SODIUM CHLORIDE 0.9 % IV SOLN
40.0000 mg/m2 | Freq: Once | INTRAVENOUS | Status: AC
  Administered 2013-02-04: 75 mg via INTRAVENOUS
  Filled 2013-02-04: qty 75

## 2013-02-04 MED ORDER — PALONOSETRON HCL INJECTION 0.25 MG/5ML
0.2500 mg | Freq: Once | INTRAVENOUS | Status: AC
  Administered 2013-02-04: 0.25 mg via INTRAVENOUS

## 2013-02-04 MED ORDER — SODIUM CHLORIDE 0.9 % IJ SOLN
10.0000 mL | INTRAMUSCULAR | Status: DC | PRN
  Filled 2013-02-04: qty 10

## 2013-02-04 MED ORDER — SODIUM CHLORIDE 0.9 % IV SOLN
150.0000 mg | Freq: Once | INTRAVENOUS | Status: AC
  Administered 2013-02-04: 150 mg via INTRAVENOUS
  Filled 2013-02-04: qty 5

## 2013-02-04 NOTE — Patient Instructions (Addendum)
[facility] Discharge Instructions for Patients Receiving Chemotherapy  Today you received the following chemotherapy agents Cisplatin.  To help prevent nausea and vomiting after your treatment, we encourage you to take your nausea medication.   If you develop nausea and vomiting that is not controlled by your nausea medication, call the clinic.   BELOW ARE SYMPTOMS THAT SHOULD BE REPORTED IMMEDIATELY:  *FEVER GREATER THAN 100.5 F  *CHILLS WITH OR WITHOUT FEVER  NAUSEA AND VOMITING THAT IS NOT CONTROLLED WITH YOUR NAUSEA MEDICATION  *UNUSUAL SHORTNESS OF BREATH  *UNUSUAL BRUISING OR BLEEDING  TENDERNESS IN MOUTH AND THROAT WITH OR WITHOUT PRESENCE OF ULCERS  *URINARY PROBLEMS  *BOWEL PROBLEMS  UNUSUAL RASH Items with * indicate a potential emergency and should be followed up as soon as possible.  Feel free to call the clinic you have any questions or concerns. The clinic phone number is [phone].    

## 2013-02-05 ENCOUNTER — Ambulatory Visit
Admission: RE | Admit: 2013-02-05 | Discharge: 2013-02-05 | Disposition: A | Discharge status: Home / Self Care | Payer: Medicare Other | Source: Ambulatory Visit | Attending: Radiation Oncology | Admitting: Radiation Oncology

## 2013-02-05 NOTE — Addendum Note (Signed)
Encounter addended by: Bernell List on: 02/05/2013 12:21 PM<BR>     Documentation filed: Charges VN

## 2013-02-08 ENCOUNTER — Other Ambulatory Visit: Payer: Medicare Other | Admitting: Lab

## 2013-02-08 ENCOUNTER — Ambulatory Visit: Payer: Medicare Other

## 2013-02-08 ENCOUNTER — Ambulatory Visit: Payer: Medicare Other | Admitting: Hematology & Oncology

## 2013-02-08 ENCOUNTER — Ambulatory Visit
Admission: RE | Admit: 2013-02-08 | Discharge: 2013-02-08 | Disposition: A | Discharge status: Home / Self Care | Payer: Medicare Other | Source: Ambulatory Visit | Attending: Radiation Oncology | Admitting: Radiation Oncology

## 2013-02-09 ENCOUNTER — Ambulatory Visit
Admission: RE | Admit: 2013-02-09 | Discharge: 2013-02-09 | Disposition: A | Discharge status: Home / Self Care | Payer: Medicare Other | Source: Ambulatory Visit | Attending: Radiation Oncology | Admitting: Radiation Oncology

## 2013-02-09 ENCOUNTER — Other Ambulatory Visit: Payer: Self-pay | Admitting: Nurse Practitioner

## 2013-02-09 ENCOUNTER — Telehealth: Payer: Self-pay | Admitting: *Deleted

## 2013-02-09 ENCOUNTER — Encounter: Payer: Self-pay | Admitting: Radiation Oncology

## 2013-02-09 DIAGNOSIS — C3492 Malignant neoplasm of unspecified part of left bronchus or lung: Secondary | ICD-10-CM

## 2013-02-09 DIAGNOSIS — C50919 Malignant neoplasm of unspecified site of unspecified female breast: Secondary | ICD-10-CM

## 2013-02-09 MED ORDER — GABAPENTIN 300 MG PO CAPS: 300.0000 mg | ORAL_CAPSULE | Freq: Three times a day (TID) | ORAL | Status: AC

## 2013-02-09 NOTE — Progress Notes (Signed)
Weekly rad txs, 6/25 so far left chest, pain left rib cage upper on back, takes oxycontin  Bid  And oxy ir prn, breakthrough pain, has scratchy throat, ate yogurt for breakfast, water, had boost last night , no taste for foods, loss 2 lbs 1 week, asked to schedule nutrition for patient, has hjust started mood swings, all of a sudden yestrday depressed and crying, wants to wait on nutritionist till nex week, will try and make appt, next chemotherapy this thursday with Dr. Gustavo Lah office Fatigued, not using radaplex gel as yet,skin okay 8:56 AM

## 2013-02-09 NOTE — Progress Notes (Signed)
[facility]    Radiation Oncology [address] [place]     Maryln Gottron, M.D. [place], [place] [address]               Billie Lade, M.D., Ph.D. Phone: [phone]      Molli Hazard A. Kathrynn Running, M.D. Fax: [phone]      Radene Gunning, M.D., Ph.D.         Lurline Hare, M.D.         Grayland Jack, M.D Weekly Treatment Management Note  Name: Katelyn [name]     MRN: [id number]        CSN: [id number] Date: 02/09/2013      DOB: 1945-08-18  CC: Katelyn Heap, MD         Christell Constant    Status: Outpatient  Diagnosis: The encounter diagnosis was Lung cancer, left.  Current Dose: 12 Gy  Current Fraction: 6  Planned Dose: 50 Gy  Narrative: Katelyn [name] was seen today for weekly treatment management. The chart was checked and CBCT  were reviewed. She is tolerating the treatments well this time. She has noticed some mood swings and crying spells and will speak with Dr.Ennever later this week about possible medication for these issues.  She denies any pain with swallowing or difficulty with swallowing. She does have some hoarseness and fatigue. She complains of pain radiating to the left lateral chest and left anterior chest region. she denies any significant pain in the left back region at this time.  Her pain medications are taking care of her pain thus far.  Review of patient's allergies indicates no known allergies. Current Outpatient Prescriptions  Medication Sig Dispense Refill  . aspirin 325 MG tablet Take 325 mg by mouth daily.      Marland Kitchen atorvastatin (LIPITOR) 40 MG tablet Take 40 mg by mouth every morning.      . calcium carbonate (OS-CAL) 600 MG TABS Take 600 mg by mouth daily with breakfast.      . Cholecalciferol (VITAMIN D) 2000 UNITS CAPS Take 2,000 Units by mouth daily.       . citalopram (CELEXA) 40 MG tablet Take 40 mg by mouth every morning.      Marland Kitchen dexamethasone (DECADRON) 4 MG tablet Take 2 tabs once a day on the day after chemo then 2 tabs twice daily for 2  days.  30 tablet  1  . gabapentin (NEURONTIN) 300 MG capsule Take 1 capsule (300 mg total) by mouth 3 (three) times daily.  90 capsule  2  . ibuprofen (ADVIL,MOTRIN) 200 MG tablet Take 400 mg by mouth every 6 (six) hours as needed for pain.      Marland Kitchen levothyroxine (SYNTHROID, LEVOTHROID) 112 MCG tablet Take 112 mcg by mouth daily before breakfast.      . lidocaine-prilocaine (EMLA) cream Apply topically as needed. Apply quarter sized amount onto site of portacath 1-1 1/2 hours prior to using portacath.  30 g  6  . lisinopril-hydrochlorothiazide (PRINZIDE,ZESTORETIC) 10-12.5 MG per tablet Take 1 tablet by mouth every morning.      Marland Kitchen LORazepam (ATIVAN) 0.5 MG tablet Take 0.5 mg by mouth daily.      . metoprolol tartrate (LOPRESSOR) 25 MG tablet Take 12.5 mg by mouth 2 (two) times daily. TAKES 1/2 TABLET      . Multiple Minerals-Vitamins (CALCIUM & VIT D3 BONE HEALTH PO) Take by mouth every morning.      . ondansetron (ZOFRAN) 8 MG tablet Take one  tablet twice a day as needed for nausea and vomiting starting the 3rd day after chemo.  30 tablet  1  . oxyCODONE (OXY IR/ROXICODONE) 5 MG immediate release tablet Take 1-2 tablets (5-10 mg total) by mouth every 4 (four) hours as needed for pain.  60 tablet  0  . oxyCODONE (OXYCONTIN) 10 MG 12 hr tablet Take 1 tablet (10 mg total) by mouth every 12 (twelve) hours.  60 tablet  0  . pramipexole (MIRAPEX) 0.125 MG tablet Take 1 tablet (0.125 mg total) by mouth 3 (three) times daily.  90 tablet  5  . prochlorperazine (COMPAZINE) 10 MG tablet Take 1 tablet (10 mg total) by mouth every 6 (six) hours as needed.  30 tablet  3  . traZODone (DESYREL) 50 MG tablet Take 1 tablet (50 mg total) by mouth at bedtime.  30 tablet  5  . fentaNYL (DURAGESIC - DOSED MCG/HR) 25 MCG/HR Place 1 patch (25 mcg total) onto the skin every 3 (three) days.  10 patch  0   No current facility-administered medications for this encounter.   Labs:  Lab Results  Component Value Date   WBC  16.2* 02/04/2013   HGB 13.1 02/04/2013   HCT 39.3 02/04/2013   MCV 95 02/04/2013   PLT 248 02/04/2013   Lab Results  Component Value Date   CREATININE 1.2 02/04/2013   BUN 26* 02/04/2013   NA 136 02/04/2013   K 4.0 02/04/2013   CL 94* 02/04/2013   CO2 34* 02/04/2013   Lab Results  Component Value Date   ALT 9 01/07/2013   AST 15 01/07/2013   BILITOT 0.4 01/07/2013    Physical Examination:  weight is 165 lb 4.8 oz (74.98 kg). Her oral temperature is 98.4 F (36.9 C). Her blood pressure is 115/73 and her pulse is 70. Her respiration is 20.    Wt Readings from Last 3 Encounters:  02/09/13 165 lb 4.8 oz (74.98 kg)  02/02/13 163 lb 11.2 oz (74.254 kg)  01/28/13 167 lb (75.751 kg)     Lungs - Normal respiratory effort, chest expands symmetrically. Lungs are clear to auscultation, no crackles or wheezes.  Heart has regular rhythm and rate  Abdomen is soft and non tender with normal bowel sounds  Assessment:  Patient tolerating treatments well  Plan: Continue treatment per original radiation prescription

## 2013-02-09 NOTE — Telephone Encounter (Signed)
Called Katelyn [name], requesting she call patient to set up an appt for nutrition next week, gave patient phone number and date of birth

## 2013-02-10 ENCOUNTER — Ambulatory Visit
Admission: RE | Admit: 2013-02-10 | Discharge: 2013-02-10 | Disposition: A | Discharge status: Home / Self Care | Payer: Medicare Other | Source: Ambulatory Visit | Attending: Radiation Oncology | Admitting: Radiation Oncology

## 2013-02-10 ENCOUNTER — Other Ambulatory Visit: Payer: Self-pay | Admitting: *Deleted

## 2013-02-10 DIAGNOSIS — C3490 Malignant neoplasm of unspecified part of unspecified bronchus or lung: Secondary | ICD-10-CM

## 2013-02-11 ENCOUNTER — Other Ambulatory Visit (HOSPITAL_BASED_OUTPATIENT_CLINIC_OR_DEPARTMENT_OTHER): Payer: Medicare Other | Admitting: Lab

## 2013-02-11 ENCOUNTER — Ambulatory Visit
Admission: RE | Admit: 2013-02-11 | Discharge: 2013-02-11 | Disposition: A | Discharge status: Home / Self Care | Payer: Medicare Other | Source: Ambulatory Visit | Attending: Radiation Oncology | Admitting: Radiation Oncology

## 2013-02-11 ENCOUNTER — Ambulatory Visit (HOSPITAL_BASED_OUTPATIENT_CLINIC_OR_DEPARTMENT_OTHER): Payer: Medicare Other

## 2013-02-11 ENCOUNTER — Ambulatory Visit (HOSPITAL_BASED_OUTPATIENT_CLINIC_OR_DEPARTMENT_OTHER): Payer: Medicare Other | Admitting: Hematology & Oncology

## 2013-02-11 ENCOUNTER — Other Ambulatory Visit: Payer: Self-pay

## 2013-02-11 VITALS — BP 100/46 | HR 71 | Temp 98.1°F | Resp 20 | Wt 165.0 lb

## 2013-02-11 DIAGNOSIS — F329 Major depressive disorder, single episode, unspecified: Secondary | ICD-10-CM

## 2013-02-11 DIAGNOSIS — C782 Secondary malignant neoplasm of pleura: Secondary | ICD-10-CM

## 2013-02-11 DIAGNOSIS — C3410 Malignant neoplasm of upper lobe, unspecified bronchus or lung: Secondary | ICD-10-CM

## 2013-02-11 DIAGNOSIS — C3492 Malignant neoplasm of unspecified part of left bronchus or lung: Secondary | ICD-10-CM

## 2013-02-11 DIAGNOSIS — Z5111 Encounter for antineoplastic chemotherapy: Secondary | ICD-10-CM

## 2013-02-11 DIAGNOSIS — C50919 Malignant neoplasm of unspecified site of unspecified female breast: Secondary | ICD-10-CM

## 2013-02-11 DIAGNOSIS — G2581 Restless legs syndrome: Secondary | ICD-10-CM

## 2013-02-11 DIAGNOSIS — C3490 Malignant neoplasm of unspecified part of unspecified bronchus or lung: Secondary | ICD-10-CM

## 2013-02-11 LAB — CBC WITH DIFFERENTIAL (CANCER CENTER ONLY)
BASO#: 0 10*3/uL (ref 0.0–0.2)
BASO%: 0.1 % (ref 0.0–2.0)
Eosinophils Absolute: 0.1 10*3/uL (ref 0.0–0.5)
HCT: 38.6 % (ref 34.8–46.6)
HGB: 12.8 g/dL (ref 11.6–15.9)
LYMPH#: 2 10*3/uL (ref 0.9–3.3)
LYMPH%: 17.7 % (ref 14.0–48.0)
MCV: 96 fL (ref 81–101)
MONO#: 1.4 10*3/uL — ABNORMAL HIGH (ref 0.1–0.9)
NEUT%: 69.5 % (ref 39.6–80.0)
RBC: 4.01 10*6/uL (ref 3.70–5.32)
WBC: 11.3 10*3/uL — ABNORMAL HIGH (ref 3.9–10.0)

## 2013-02-11 LAB — CMP (CANCER CENTER ONLY)
ALT(SGPT): 11 U/L (ref 10–47)
CO2: 33 mEq/L (ref 18–33)
Calcium: 9.4 mg/dL (ref 8.0–10.3)
Chloride: 96 mEq/L — ABNORMAL LOW (ref 98–108)
Creat: 1.2 mg/dl (ref 0.6–1.2)
Glucose, Bld: 87 mg/dL (ref 73–118)
Total Bilirubin: 0.8 mg/dl (ref 0.20–1.60)

## 2013-02-11 MED ORDER — SODIUM CHLORIDE 0.9 % IV SOLN
150.0000 mg | Freq: Once | INTRAVENOUS | Status: AC
  Administered 2013-02-11: 150 mg via INTRAVENOUS
  Filled 2013-02-11: qty 5

## 2013-02-11 MED ORDER — SODIUM CHLORIDE 0.9 % IV SOLN
40.0000 mg/m2 | Freq: Once | INTRAVENOUS | Status: AC
  Administered 2013-02-11: 75 mg via INTRAVENOUS
  Filled 2013-02-11: qty 75

## 2013-02-11 MED ORDER — SODIUM CHLORIDE 0.9 % IV SOLN
Freq: Once | INTRAVENOUS | Status: AC
  Administered 2013-02-11: 10:00:00 via INTRAVENOUS

## 2013-02-11 MED ORDER — DEXAMETHASONE SODIUM PHOSPHATE 20 MG/5ML IJ SOLN
12.0000 mg | Freq: Once | INTRAMUSCULAR | Status: AC
  Administered 2013-02-11: 12 mg via INTRAVENOUS
  Filled 2013-02-11: qty 5

## 2013-02-11 MED ORDER — PRAMIPEXOLE DIHYDROCHLORIDE 0.125 MG PO TABS: 0.1250 mg | ORAL_TABLET | Freq: Three times a day (TID) | ORAL | Status: AC

## 2013-02-11 MED ORDER — HEPARIN SOD (PORK) LOCK FLUSH 100 UNIT/ML IV SOLN
500.0000 [IU] | Freq: Once | INTRAVENOUS | Status: AC | PRN
  Administered 2013-02-11: 500 [IU]
  Filled 2013-02-11: qty 5

## 2013-02-11 MED ORDER — PALONOSETRON HCL INJECTION 0.25 MG/5ML
0.2500 mg | Freq: Once | INTRAVENOUS | Status: AC
  Administered 2013-02-11: 0.25 mg via INTRAVENOUS

## 2013-02-11 MED ORDER — POTASSIUM CHLORIDE 2 MEQ/ML IV SOLN
Freq: Once | INTRAVENOUS | Status: AC
  Administered 2013-02-11: 11:00:00 via INTRAVENOUS
  Filled 2013-02-11: qty 10

## 2013-02-11 MED ORDER — SODIUM CHLORIDE 0.9 % IJ SOLN
10.0000 mL | INTRAMUSCULAR | Status: DC | PRN
  Administered 2013-02-11: 10 mL
  Filled 2013-02-11: qty 10

## 2013-02-11 MED ORDER — MIRTAZAPINE 15 MG PO TABS: 15.0000 mg | ORAL_TABLET | Freq: Every day | ORAL | Status: AC

## 2013-02-11 NOTE — Telephone Encounter (Signed)
Refills done earlier today

## 2013-02-11 NOTE — Patient Instructions (Addendum)
[facility] Discharge Instructions for Patients Receiving Chemotherapy  Today you received the following chemotherapy agents Cisplatin.  To help prevent nausea and vomiting after your treatment, we encourage you to take your nausea medication.   If you develop nausea and vomiting that is not controlled by your nausea medication, call the clinic.   BELOW ARE SYMPTOMS THAT SHOULD BE REPORTED IMMEDIATELY:  *FEVER GREATER THAN 100.5 F  *CHILLS WITH OR WITHOUT FEVER  NAUSEA AND VOMITING THAT IS NOT CONTROLLED WITH YOUR NAUSEA MEDICATION  *UNUSUAL SHORTNESS OF BREATH  *UNUSUAL BRUISING OR BLEEDING  TENDERNESS IN MOUTH AND THROAT WITH OR WITHOUT PRESENCE OF ULCERS  *URINARY PROBLEMS  *BOWEL PROBLEMS  UNUSUAL RASH Items with * indicate a potential emergency and should be followed up as soon as possible.  Feel free to call the clinic you have any questions or concerns. The clinic phone number is [phone].    

## 2013-02-11 NOTE — Telephone Encounter (Signed)
x

## 2013-02-11 NOTE — Progress Notes (Signed)
This office note has been dictated.

## 2013-02-11 NOTE — Telephone Encounter (Signed)
Last seen 07/05/12  MMM  Last thyroid labs 09/27/11

## 2013-02-12 ENCOUNTER — Ambulatory Visit
Admission: RE | Admit: 2013-02-12 | Discharge: 2013-02-12 | Disposition: A | Discharge status: Home / Self Care | Payer: Medicare Other | Source: Ambulatory Visit | Attending: Radiation Oncology | Admitting: Radiation Oncology

## 2013-02-12 NOTE — Telephone Encounter (Signed)
Filled mirapex and remeron yesterday- Is that what she needed

## 2013-02-12 NOTE — Progress Notes (Signed)
CC:   Katelyn [name], M.D. Katelyn [name], M.D.  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  CURRENT THERAPY:  Patient receiving weekly low-dose cisplatin and radiation therapy for left pleural-based recurrence.  INTERIM HISTORY:  Katelyn [name] comes in for followup.  She is holding her own although her daughter is worried about her getting depressed.  Her daughter says that she really does not do that much.  She is not all that active.  She will not let us know about her pain.  Currently she is on OxyContin 10 mg twice a day.  She says she only takes the OxyIR 2 or 3 times a day.  Her appetite is down according to her daughter.  I do think that there is an element of depression.  She is on Celexa.  I will add Remeron.  Hopefully, this might help also with her appetite.  She is on Synthroid but I do not think that hypothyroidism is a problem. We did check her TSH back in July and this was normal at 1.13.  PHYSICAL EXAMINATION:  General:  This is a fairly well-developed, well- nourished white female in no obvious distress.  Vital signs: Temperature of 98.1, pulse 71, respiratory rate 20, blood pressure 100/46.  Weight is 165.  Head and neck:  Normocephalic, atraumatic skull.  There are no ocular or oral lesions.  There are no palpable cervical or supraclavicular lymph nodes.  Lungs:  Clear bilaterally. Cardiac:  Regular rate and rhythm with a normal S1, S2.  There are no murmurs, rubs or bruits.  Abdomen:  Soft.  She has good bowel sounds. There is no fluid wave.  There is no palpable hepatosplenomegaly. Extremities:  Shows no clubbing, cyanosis or edema.  Neurological: Shows no focal neurological deficits.  LABORATORY STUDIES:  Show white cell count 11.3, hemoglobin 12.8, hematocrit 38.6, platelet count 168. Calcium is 9.4 with an albumin of 3.1.  IMPRESSION:  Katelyn [name] is a 67 year old female with recurrent squamous cell carcinoma.  She underwent  resection back in 08/2011.  She had a tough time with surgery.  She had negative margins and negative lymph nodes.  I did not think that she would be a good candidate for adjuvant chemotherapy because of her postop performance status.  She now has recurrence in the left pleura/chest wall.  We will put her on some Remeron.  We will see if this helps with respect to her feeling better and also trying to eat a little bit better.  We also need to make sure we get her on Zometa.  She was on Zometa when she was diagnosed with recurrence in the hospital.  We want to make sure that we continue her on this.  We will go ahead and continue her on the weekly platinum.  She still has about 4 weeks to go of radiation.  I will get her back to see me in another 3 weeks or so.    ______________________________ Josph Macho, M.D. PRE/MEDQ  D:  02/11/2013  T:  02/12/2013  Job:  [id number]

## 2013-02-15 ENCOUNTER — Ambulatory Visit: Payer: Medicare Other

## 2013-02-15 ENCOUNTER — Other Ambulatory Visit: Payer: Medicare Other | Admitting: Lab

## 2013-02-15 ENCOUNTER — Ambulatory Visit
Admission: RE | Admit: 2013-02-15 | Discharge: 2013-02-15 | Disposition: A | Discharge status: Home / Self Care | Payer: Medicare Other | Source: Ambulatory Visit | Attending: Radiation Oncology | Admitting: Radiation Oncology

## 2013-02-16 ENCOUNTER — Ambulatory Visit: Payer: Medicare Other | Admitting: Nutrition

## 2013-02-16 ENCOUNTER — Ambulatory Visit
Admission: RE | Admit: 2013-02-16 | Discharge: 2013-02-16 | Disposition: A | Discharge status: Home / Self Care | Payer: Medicare Other | Source: Ambulatory Visit | Attending: Radiation Oncology | Admitting: Radiation Oncology

## 2013-02-16 VITALS — BP 133/53 | HR 78 | Temp 98.2°F | Resp 20 | Wt 170.0 lb

## 2013-02-16 DIAGNOSIS — C3492 Malignant neoplasm of unspecified part of left bronchus or lung: Secondary | ICD-10-CM

## 2013-02-16 NOTE — Progress Notes (Signed)
Patient was unable to stay for scheduled nutrition appointment.  I called patient on the telephone and spoke with her.  Patient is a 67 year old female diagnosed with recurrent lung cancer status post resection in March 2013.  Patient is receiving chemotherapy and radiation therapy.  Past medical history includes hypertension, PVD, hypothyroidism, depression, hard of hearing and breast cancer.  Medications include Zometa, Remeron, Lipitor, vitamin D, Celexa, Decadron, Synthroid, Ativan, Zofran, and Compazine.  Labs include albumin 3.1 on August 14.  Height: 66 inches. Weight: 170 pounds. Usual body weight: 165 pounds. BMI: 27.45.  Patient reports that her appetite has increased and she has been able to eat more.  She denies nausea and vomiting, or constipation, and diarrhea.  She has some difficulty swallowing, but this has not affected her oral intake.  Nutrition diagnosis: Food and nutrition related knowledge deficit related to diagnosis of recurrent lung cancer and associated treatments as evidenced by no prior need for nutrition related information.  Intervention: Patient was educated to consume small, frequent meals with adequate protein to promote weight maintenance.  I've educated patient on protein sources in her diet.  I have recommended softer foods if swallowing becomes difficult.  I will mail fact sheets to patient at her request.  Questions were answered.  Teach back method used.  Monitoring, evaluation, goals: Patient will tolerate adequate calories and protein to minimize weight loss and side effects from treatment.  Nutrition diagnosis resolved.  Next visit: Patient will contact me by phone if she develops nutrition issues.

## 2013-02-16 NOTE — Progress Notes (Addendum)
Pt c/o "stinging inside her left chest", some fatigue, tingling in her feet and swelling, pain in her fingertips and darkening of fingernails. Pt receiving chemo every Thurs. Pt's feet not swollen today, advised her that feet, hand, finger problems are most likely side effects from her chemotherapy. Pt states she has occasional dry cough, SOB but less than she has had, appetite increasing. She is applying Radiaplex to left chest tx area. Pt states she cannot stay for nutrition appt today. Notified B Neff who will call pt today.

## 2013-02-16 NOTE — Progress Notes (Signed)
[facility]    Radiation Oncology [address] [place]     Maryln Gottron, M.D. [place], [place] [address]               Billie Lade, M.D., Ph.D. Phone: [phone]      Molli Hazard A. Kathrynn Running, M.D. Fax: [phone]      Radene Gunning, M.D., Ph.D.         Lurline Hare, M.D.         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  Current Dose: 22 Gy  Current Fraction: 11  Planned Dose: 50  Gy  Narrative: Katelyn [name] was seen today for weekly treatment management. The chart was checked and CBCT  were reviewed. She is tolerating her treatments well at this time. She denies any swallowing difficulties.  Her breathing problems have slightly improved.  Review of patient's allergies indicates no known allergies.  Current Outpatient Prescriptions  Medication Sig Dispense Refill  . aspirin 325 MG tablet Take 325 mg by mouth daily.      . calcium carbonate (OS-CAL) 600 MG TABS Take 600 mg by mouth daily with breakfast.      . Cholecalciferol (VITAMIN D) 2000 UNITS CAPS Take 2,000 Units by mouth daily.       . citalopram (CELEXA) 40 MG tablet Take 40 mg by mouth every morning.      Marland Kitchen dexamethasone (DECADRON) 4 MG tablet Take 2 tabs once a day on the day after chemo then 2 tabs twice daily for 2 days.  30 tablet  1  . gabapentin (NEURONTIN) 300 MG capsule Take 1 capsule (300 mg total) by mouth 3 (three) times daily.  270 capsule  2  . ibuprofen (ADVIL,MOTRIN) 200 MG tablet Take 400 mg by mouth every 6 (six) hours as needed for pain.      Marland Kitchen levothyroxine (SYNTHROID, LEVOTHROID) 112 MCG tablet Take 112 mcg by mouth daily before breakfast.      . lidocaine-prilocaine (EMLA) cream Apply topically as needed. Apply quarter sized  amount onto site of portacath 1-1 1/2 hours prior to using portacath.  30 g  6  . LORazepam (ATIVAN) 0.5 MG tablet Take 0.5 mg by mouth daily.      . metoprolol tartrate (LOPRESSOR) 25 MG tablet Take 12.5 mg by mouth 2 (two) times daily. TAKES 1/2 TABLET      . mirtazapine (REMERON) 15 MG tablet Take 1 tablet (15 mg total) by mouth at bedtime.  30 tablet  4  . Multiple Minerals-Vitamins (CALCIUM & VIT D3 BONE HEALTH PO) Take by mouth every morning.      . ondansetron (ZOFRAN) 8 MG tablet Take one tablet twice a day as needed for nausea and vomiting starting the 3rd day after chemo.  30 tablet  1  . oxyCODONE (OXY IR/ROXICODONE) 5 MG immediate release tablet Take 1-2 tablets (5-10 mg total) by mouth every 4 (four) hours as needed for pain.  60 tablet  0  . oxyCODONE (OXYCONTIN) 10 MG 12 hr tablet Take 1 tablet (10 mg total) by mouth every 12 (twelve) hours.  60 tablet  0  .  pramipexole (MIRAPEX) 0.125 MG tablet Take 1 tablet (0.125 mg total) by mouth 3 (three) times daily.  270 tablet  1  . prochlorperazine (COMPAZINE) 10 MG tablet Take 1 tablet (10 mg total) by mouth every 6 (six) hours as needed.  30 tablet  3  . traZODone (DESYREL) 50 MG tablet Take 1 tablet (50 mg total) by mouth at bedtime.  30 tablet  5  . atorvastatin (LIPITOR) 40 MG tablet Take 40 mg by mouth every morning.       No current facility-administered medications for this encounter.   Labs:  Lab Results  Component Value Date   WBC 11.3* 02/11/2013   HGB 12.8 02/11/2013   HCT 38.6 02/11/2013   MCV 96 02/11/2013   PLT 168 02/11/2013   Lab Results  Component Value Date   CREATININE 1.2 02/11/2013   BUN 19 02/11/2013   NA 136 02/11/2013   K 4.0 02/11/2013   CL 96* 02/11/2013   CO2 33 02/11/2013   Lab Results  Component Value Date   ALT 11 02/11/2013   AST 16 02/11/2013   BILITOT 0.80 02/11/2013    Physical Examination:  weight is 170 lb (77.111 kg). Her temperature is 98.2 F (36.8 C). Her blood pressure is 133/53 and her  pulse is 78. Her respiration is 20 and oxygen saturation is 97%.    Wt Readings from Last 3 Encounters:  02/16/13 170 lb (77.111 kg)  02/11/13 165 lb (74.844 kg)  02/09/13 165 lb 4.8 oz (74.98 kg)    The left posterior back area shows mild erythema Lungs - Normal respiratory effort, chest expands symmetrically. Lungs are clear to auscultation, no crackles or wheezes.  Heart has regular rhythm and rate  Abdomen is soft and non tender with normal bowel sounds  Assessment:  Patient tolerating treatments well  Plan: Continue treatment per original radiation prescription

## 2013-02-17 ENCOUNTER — Ambulatory Visit
Admission: RE | Admit: 2013-02-17 | Discharge: 2013-02-17 | Disposition: A | Discharge status: Home / Self Care | Payer: Medicare Other | Source: Ambulatory Visit | Attending: Radiation Oncology | Admitting: Radiation Oncology

## 2013-02-18 ENCOUNTER — Ambulatory Visit
Admission: RE | Admit: 2013-02-18 | Discharge: 2013-02-18 | Disposition: A | Discharge status: Home / Self Care | Payer: Medicare Other | Source: Ambulatory Visit | Attending: Radiation Oncology | Admitting: Radiation Oncology

## 2013-02-18 ENCOUNTER — Ambulatory Visit (HOSPITAL_BASED_OUTPATIENT_CLINIC_OR_DEPARTMENT_OTHER): Payer: Medicare Other | Admitting: Lab

## 2013-02-18 ENCOUNTER — Telehealth: Payer: Self-pay | Admitting: Dietician

## 2013-02-18 ENCOUNTER — Ambulatory Visit (HOSPITAL_BASED_OUTPATIENT_CLINIC_OR_DEPARTMENT_OTHER): Payer: Medicare Other

## 2013-02-18 ENCOUNTER — Other Ambulatory Visit: Payer: Self-pay | Admitting: *Deleted

## 2013-02-18 DIAGNOSIS — C3492 Malignant neoplasm of unspecified part of left bronchus or lung: Secondary | ICD-10-CM

## 2013-02-18 DIAGNOSIS — C782 Secondary malignant neoplasm of pleura: Secondary | ICD-10-CM

## 2013-02-18 DIAGNOSIS — C3410 Malignant neoplasm of upper lobe, unspecified bronchus or lung: Secondary | ICD-10-CM

## 2013-02-18 DIAGNOSIS — R079 Chest pain, unspecified: Secondary | ICD-10-CM

## 2013-02-18 DIAGNOSIS — Z5111 Encounter for antineoplastic chemotherapy: Secondary | ICD-10-CM

## 2013-02-18 DIAGNOSIS — C7951 Secondary malignant neoplasm of bone: Secondary | ICD-10-CM

## 2013-02-18 LAB — CBC WITH DIFFERENTIAL (CANCER CENTER ONLY)
BASO%: 0.1 % (ref 0.0–2.0)
Eosinophils Absolute: 0.1 10*3/uL (ref 0.0–0.5)
HCT: 37.4 % (ref 34.8–46.6)
LYMPH%: 23.8 % (ref 14.0–48.0)
MCV: 97 fL (ref 81–101)
MONO#: 0.8 10*3/uL (ref 0.1–0.9)
NEUT%: 64.1 % (ref 39.6–80.0)
RDW: 13.6 % (ref 11.1–15.7)
WBC: 7.4 10*3/uL (ref 3.9–10.0)

## 2013-02-18 MED ORDER — OXYCODONE HCL 5 MG PO TABS: 5.0000 mg | ORAL_TABLET | ORAL | Status: AC | PRN

## 2013-02-18 MED ORDER — ZOLEDRONIC ACID 4 MG/100ML IV SOLN
4.0000 mg | Freq: Once | INTRAVENOUS | Status: AC
  Administered 2013-02-18: 4 mg via INTRAVENOUS
  Filled 2013-02-18: qty 100

## 2013-02-18 MED ORDER — SODIUM CHLORIDE 0.9 % IV SOLN
150.0000 mg | Freq: Once | INTRAVENOUS | Status: AC
  Administered 2013-02-18: 150 mg via INTRAVENOUS
  Filled 2013-02-18: qty 5

## 2013-02-18 MED ORDER — SODIUM CHLORIDE 0.9 % IV SOLN
Freq: Once | INTRAVENOUS | Status: AC
  Administered 2013-02-18: 11:00:00 via INTRAVENOUS

## 2013-02-18 MED ORDER — SODIUM CHLORIDE 0.9 % IV SOLN
40.0000 mg/m2 | Freq: Once | INTRAVENOUS | Status: AC
  Administered 2013-02-18: 75 mg via INTRAVENOUS
  Filled 2013-02-18: qty 75

## 2013-02-18 MED ORDER — DEXAMETHASONE SODIUM PHOSPHATE 20 MG/5ML IJ SOLN
12.0000 mg | Freq: Once | INTRAMUSCULAR | Status: AC
  Administered 2013-02-18: 12 mg via INTRAVENOUS
  Filled 2013-02-18: qty 5

## 2013-02-18 MED ORDER — POTASSIUM CHLORIDE 2 MEQ/ML IV SOLN
Freq: Once | INTRAVENOUS | Status: AC
  Administered 2013-02-18: 11:00:00 via INTRAVENOUS
  Filled 2013-02-18: qty 10

## 2013-02-18 MED ORDER — HEPARIN SOD (PORK) LOCK FLUSH 100 UNIT/ML IV SOLN
500.0000 [IU] | Freq: Once | INTRAVENOUS | Status: AC | PRN
  Administered 2013-02-18: 500 [IU]
  Filled 2013-02-18: qty 5

## 2013-02-18 MED ORDER — PALONOSETRON HCL INJECTION 0.25 MG/5ML
0.2500 mg | Freq: Once | INTRAVENOUS | Status: AC
  Administered 2013-02-18: 0.25 mg via INTRAVENOUS

## 2013-02-18 MED ORDER — SODIUM CHLORIDE 0.9 % IJ SOLN
10.0000 mL | INTRAMUSCULAR | Status: DC | PRN
  Administered 2013-02-18: 10 mL
  Filled 2013-02-18: qty 10

## 2013-02-18 NOTE — Patient Instructions (Addendum)
[facility] Discharge Instructions for Patients Receiving Chemotherapy  Today you received the following chemotherapy agents Cisplatin.  To help prevent nausea and vomiting after your treatment, we encourage you to take your nausea medication.   If you develop nausea and vomiting that is not controlled by your nausea medication, call the clinic.   BELOW ARE SYMPTOMS THAT SHOULD BE REPORTED IMMEDIATELY:  *FEVER GREATER THAN 100.5 F  *CHILLS WITH OR WITHOUT FEVER  NAUSEA AND VOMITING THAT IS NOT CONTROLLED WITH YOUR NAUSEA MEDICATION  *UNUSUAL SHORTNESS OF BREATH  *UNUSUAL BRUISING OR BLEEDING  TENDERNESS IN MOUTH AND THROAT WITH OR WITHOUT PRESENCE OF ULCERS  *URINARY PROBLEMS  *BOWEL PROBLEMS  UNUSUAL RASH Items with * indicate a potential emergency and should be followed up as soon as possible.  Feel free to call the clinic you have any questions or concerns. The clinic phone number is [phone].    

## 2013-02-19 ENCOUNTER — Ambulatory Visit
Admission: RE | Admit: 2013-02-19 | Discharge: 2013-02-19 | Disposition: A | Discharge status: Home / Self Care | Payer: Medicare Other | Source: Ambulatory Visit | Attending: Radiation Oncology | Admitting: Radiation Oncology

## 2013-02-19 ENCOUNTER — Telehealth: Payer: Self-pay | Admitting: Nurse Practitioner

## 2013-02-19 DIAGNOSIS — G2581 Restless legs syndrome: Secondary | ICD-10-CM

## 2013-02-19 DIAGNOSIS — F329 Major depressive disorder, single episode, unspecified: Secondary | ICD-10-CM

## 2013-02-19 DIAGNOSIS — C50919 Malignant neoplasm of unspecified site of unspecified female breast: Secondary | ICD-10-CM

## 2013-02-19 DIAGNOSIS — C3492 Malignant neoplasm of unspecified part of left bronchus or lung: Secondary | ICD-10-CM

## 2013-02-19 NOTE — Telephone Encounter (Signed)
Have pharmacy fax over refill requests- not sure what all she needs and hat will be easier

## 2013-02-19 NOTE — Telephone Encounter (Signed)
Pt requesting refills on meds.

## 2013-02-20 ENCOUNTER — Encounter: Payer: Self-pay | Admitting: Hematology & Oncology

## 2013-02-20 ENCOUNTER — Emergency Department (HOSPITAL_COMMUNITY): Payer: Medicare Other

## 2013-02-20 ENCOUNTER — Encounter (HOSPITAL_COMMUNITY): Payer: Self-pay | Admitting: *Deleted

## 2013-02-20 ENCOUNTER — Inpatient Hospital Stay (HOSPITAL_COMMUNITY)
Admission: EM | Admit: 2013-02-20 | Discharge: 2013-02-23 | DRG: 194 | Disposition: A | Discharge status: Home / Self Care | Payer: Medicare Other | Attending: Family Medicine | Admitting: Family Medicine

## 2013-02-20 DIAGNOSIS — G8929 Other chronic pain: Secondary | ICD-10-CM | POA: Diagnosis present

## 2013-02-20 DIAGNOSIS — R042 Hemoptysis: Secondary | ICD-10-CM

## 2013-02-20 DIAGNOSIS — R222 Localized swelling, mass and lump, trunk: Secondary | ICD-10-CM

## 2013-02-20 DIAGNOSIS — J189 Pneumonia, unspecified organism: Secondary | ICD-10-CM

## 2013-02-20 DIAGNOSIS — E039 Hypothyroidism, unspecified: Secondary | ICD-10-CM | POA: Diagnosis present

## 2013-02-20 DIAGNOSIS — Z7982 Long term (current) use of aspirin: Secondary | ICD-10-CM

## 2013-02-20 DIAGNOSIS — Z791 Long term (current) use of non-steroidal anti-inflammatories (NSAID): Secondary | ICD-10-CM

## 2013-02-20 DIAGNOSIS — Z87891 Personal history of nicotine dependence: Secondary | ICD-10-CM

## 2013-02-20 DIAGNOSIS — Z9010 Acquired absence of unspecified breast and nipple: Secondary | ICD-10-CM

## 2013-02-20 DIAGNOSIS — I639 Cerebral infarction, unspecified: Secondary | ICD-10-CM

## 2013-02-20 DIAGNOSIS — Z853 Personal history of malignant neoplasm of breast: Secondary | ICD-10-CM

## 2013-02-20 DIAGNOSIS — E785 Hyperlipidemia, unspecified: Secondary | ICD-10-CM

## 2013-02-20 DIAGNOSIS — R911 Solitary pulmonary nodule: Secondary | ICD-10-CM | POA: Diagnosis present

## 2013-02-20 DIAGNOSIS — H9190 Unspecified hearing loss, unspecified ear: Secondary | ICD-10-CM | POA: Diagnosis present

## 2013-02-20 DIAGNOSIS — D72829 Elevated white blood cell count, unspecified: Secondary | ICD-10-CM | POA: Diagnosis present

## 2013-02-20 DIAGNOSIS — M25519 Pain in unspecified shoulder: Secondary | ICD-10-CM | POA: Diagnosis present

## 2013-02-20 DIAGNOSIS — C50912 Malignant neoplasm of unspecified site of left female breast: Secondary | ICD-10-CM

## 2013-02-20 DIAGNOSIS — Z79899 Other long term (current) drug therapy: Secondary | ICD-10-CM

## 2013-02-20 DIAGNOSIS — R0781 Pleurodynia: Secondary | ICD-10-CM | POA: Diagnosis present

## 2013-02-20 DIAGNOSIS — F4321 Adjustment disorder with depressed mood: Secondary | ICD-10-CM | POA: Diagnosis present

## 2013-02-20 DIAGNOSIS — G2581 Restless legs syndrome: Secondary | ICD-10-CM

## 2013-02-20 DIAGNOSIS — Z902 Acquired absence of lung [part of]: Secondary | ICD-10-CM

## 2013-02-20 DIAGNOSIS — F411 Generalized anxiety disorder: Secondary | ICD-10-CM | POA: Diagnosis present

## 2013-02-20 DIAGNOSIS — C50919 Malignant neoplasm of unspecified site of unspecified female breast: Secondary | ICD-10-CM | POA: Diagnosis present

## 2013-02-20 DIAGNOSIS — Z8673 Personal history of transient ischemic attack (TIA), and cerebral infarction without residual deficits: Secondary | ICD-10-CM

## 2013-02-20 DIAGNOSIS — Z923 Personal history of irradiation: Secondary | ICD-10-CM

## 2013-02-20 DIAGNOSIS — C3490 Malignant neoplasm of unspecified part of unspecified bronchus or lung: Secondary | ICD-10-CM | POA: Diagnosis present

## 2013-02-20 DIAGNOSIS — J438 Other emphysema: Secondary | ICD-10-CM | POA: Diagnosis present

## 2013-02-20 DIAGNOSIS — G4700 Insomnia, unspecified: Secondary | ICD-10-CM

## 2013-02-20 DIAGNOSIS — Z5189 Encounter for other specified aftercare: Secondary | ICD-10-CM

## 2013-02-20 DIAGNOSIS — M545 Low back pain, unspecified: Secondary | ICD-10-CM | POA: Diagnosis present

## 2013-02-20 DIAGNOSIS — I739 Peripheral vascular disease, unspecified: Secondary | ICD-10-CM | POA: Diagnosis present

## 2013-02-20 DIAGNOSIS — I10 Essential (primary) hypertension: Secondary | ICD-10-CM | POA: Diagnosis present

## 2013-02-20 LAB — CBC WITH DIFFERENTIAL/PLATELET
Hemoglobin: 11.3 g/dL — ABNORMAL LOW (ref 12.0–15.0)
Lymphocytes Relative: 4 % — ABNORMAL LOW (ref 12–46)
Lymphs Abs: 0.5 10*3/uL — ABNORMAL LOW (ref 0.7–4.0)
Monocytes Relative: 9 % (ref 3–12)
Neutro Abs: 10.6 10*3/uL — ABNORMAL HIGH (ref 1.7–7.7)
Neutrophils Relative %: 87 % — ABNORMAL HIGH (ref 43–77)
RBC: 3.65 MIL/uL — ABNORMAL LOW (ref 3.87–5.11)

## 2013-02-20 LAB — BASIC METABOLIC PANEL
Calcium: 8.6 mg/dL (ref 8.4–10.5)
GFR calc Af Amer: 81 mL/min — ABNORMAL LOW (ref >90)
GFR calc non Af Amer: 70 mL/min — ABNORMAL LOW (ref >90)
Sodium: 135 mEq/L (ref 135–145)

## 2013-02-20 LAB — PROTIME-INR: INR: 0.96 (ref 0.00–1.49)

## 2013-02-20 MED ORDER — PIPERACILLIN-TAZOBACTAM 3.375 G IVPB 30 MIN
3.3750 g | INTRAVENOUS | Status: AC
  Administered 2013-02-20: 3.375 g via INTRAVENOUS
  Filled 2013-02-20: qty 50

## 2013-02-20 MED ORDER — METOPROLOL TARTRATE 12.5 MG HALF TABLET
12.5000 mg | ORAL_TABLET | Freq: Two times a day (BID) | ORAL | Status: DC
  Administered 2013-02-20 – 2013-02-23 (×6): 12.5 mg via ORAL
  Filled 2013-02-20 (×7): qty 1

## 2013-02-20 MED ORDER — TRAZODONE HCL 50 MG PO TABS
50.0000 mg | ORAL_TABLET | Freq: Every day | ORAL | Status: DC
  Administered 2013-02-20 – 2013-02-22 (×3): 50 mg via ORAL
  Filled 2013-02-20 (×4): qty 1

## 2013-02-20 MED ORDER — LEVOTHYROXINE SODIUM 112 MCG PO TABS
112.0000 ug | ORAL_TABLET | Freq: Every day | ORAL | Status: DC
  Administered 2013-02-21: 112 ug via ORAL
  Administered 2013-02-22: 80 ug via ORAL
  Administered 2013-02-23: 112 ug via ORAL
  Filled 2013-02-20 (×4): qty 1

## 2013-02-20 MED ORDER — PRAMIPEXOLE DIHYDROCHLORIDE 0.125 MG PO TABS
0.1250 mg | ORAL_TABLET | Freq: Three times a day (TID) | ORAL | Status: DC
  Administered 2013-02-20 – 2013-02-23 (×9): 0.125 mg via ORAL
  Filled 2013-02-20 (×11): qty 1

## 2013-02-20 MED ORDER — OXYCODONE HCL ER 10 MG PO T12A
10.0000 mg | EXTENDED_RELEASE_TABLET | Freq: Two times a day (BID) | ORAL | Status: DC
  Administered 2013-02-20 – 2013-02-23 (×6): 10 mg via ORAL
  Filled 2013-02-20 (×6): qty 1

## 2013-02-20 MED ORDER — VANCOMYCIN HCL IN DEXTROSE 1-5 GM/200ML-% IV SOLN
1000.0000 mg | Freq: Two times a day (BID) | INTRAVENOUS | Status: DC
Start: 2013-02-20 — End: 2013-02-22
  Administered 2013-02-20 – 2013-02-22 (×4): 1000 mg via INTRAVENOUS
  Filled 2013-02-20 (×4): qty 200

## 2013-02-20 MED ORDER — DEXTROSE 5 % IV SOLN
1.0000 g | Freq: Three times a day (TID) | INTRAVENOUS | Status: DC
  Administered 2013-02-20 – 2013-02-22 (×5): 1 g via INTRAVENOUS
  Filled 2013-02-20 (×6): qty 1

## 2013-02-20 MED ORDER — GABAPENTIN 300 MG PO CAPS
300.0000 mg | ORAL_CAPSULE | Freq: Three times a day (TID) | ORAL | Status: DC
  Administered 2013-02-20 – 2013-02-23 (×9): 300 mg via ORAL
  Filled 2013-02-20 (×11): qty 1

## 2013-02-20 MED ORDER — CITALOPRAM HYDROBROMIDE 40 MG PO TABS
40.0000 mg | ORAL_TABLET | Freq: Every morning | ORAL | Status: DC
  Administered 2013-02-21 – 2013-02-23 (×3): 40 mg via ORAL
  Filled 2013-02-20 (×3): qty 1

## 2013-02-20 MED ORDER — PIPERACILLIN-TAZOBACTAM 3.375 G IVPB: 3.3750 g | Freq: Once | INTRAVENOUS | Status: DC

## 2013-02-20 MED ORDER — OXYCODONE HCL 5 MG PO TABS
5.0000 mg | ORAL_TABLET | ORAL | Status: DC | PRN
  Administered 2013-02-21: 10 mg via ORAL
  Administered 2013-02-22 – 2013-02-23 (×2): 5 mg via ORAL
  Filled 2013-02-20: qty 2
  Filled 2013-02-20 (×2): qty 1

## 2013-02-20 MED ORDER — LORAZEPAM 0.5 MG PO TABS
0.5000 mg | ORAL_TABLET | Freq: Every morning | ORAL | Status: DC
  Administered 2013-02-21 – 2013-02-23 (×3): 0.5 mg via ORAL
  Filled 2013-02-20 (×3): qty 1

## 2013-02-20 MED ORDER — VANCOMYCIN HCL IN DEXTROSE 1-5 GM/200ML-% IV SOLN
1000.0000 mg | INTRAVENOUS | Status: AC
  Administered 2013-02-20: 1000 mg via INTRAVENOUS
  Filled 2013-02-20: qty 200

## 2013-02-20 MED ORDER — MIRTAZAPINE 15 MG PO TABS
15.0000 mg | ORAL_TABLET | Freq: Every day | ORAL | Status: DC
Start: 2013-02-20 — End: 2013-02-23
  Administered 2013-02-20 – 2013-02-22 (×3): 15 mg via ORAL
  Filled 2013-02-20 (×4): qty 1

## 2013-02-20 NOTE — H&P (Signed)
[facility] History and Physical  Katelyn [name]:[id number] DOB: [birth year] DOA: 02/20/2013  Referring physician: Lars Mage, EDP PCP: [name], MD  Specialists: Oncology                 Chief Complaint: Hemoptysis  HPI: Katelyn [name] is a 67 y.o. 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 Dorris Fetch of CV surgery and recent diagnosis of recurrent NSCLC [current Rx=Ciplatin+DExamethasone] , now undergoing XRT to L hemithorax under Dr. Darleene Cleaver presented to [facility] long emergency room after consulting oncologist on-call secondary to hemoptysis. Patient is coherent and gives a good history she states that yesterday she was doing fair but then this morning had episodes of phlegm without fever or or chills which was associated with a dollar sized amount of blood. This happened 2 times. That seemed to be bright red. Patient has had no further coughing episodes or other issues. She has not been around anyone that has been sick has no nausea no vomiting no dizziness no blurred vision no double vision. She denies any other specific complaints no diarrhea no gastritis no specific sore throat. She has not had exposure to anyone who is been ill  Review of Systems: See above  Past Medical History  Diagnosis Date  . 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 Pulmonary nodule, left 08/26/2011  . Hypertension   . Restless leg syndrome   . Peripheral vascular disease   . Carotid artery stenosis     bilateral  . Glomus tumor      right  . Shortness of breath     exertional  . Hypothyroidism   . Cancer     breast, bilateral s/p mast and chemo  . Shoulder pain, left     due to vascular dz  . Left hip pain     when ambulatory  . Episodic low back pain   . Depression     stress related  . Hard of hearing   . Hx of radiation  therapy 06/04/95- 07/21/95    left chest wall, l axilla, l supraclavicular area  . Breast cancer 1996    left  . Breast cancer 1973    right, mastectomy  . Lung cancer 08/2011, 12/2012    left upper lobe, recurrence 2014   Past Surgical History  Procedure Laterality Date  . Breast surgery  4401,0272    bilateral mastectomy  . Portacath placement  1996    removed after chemo  . Vascular stent  01/07/2004    left subclavian  . Mastectomy      bilateral  . Tubal ligation    . Tonsillectomy      age 7  . Vascular surgery    . Lung cancer surgery Left 09/17/11    wedge resection, LUL  . Lung cancer surgery Left 09/21/11    lung resection   Social History:  reports that she quit smoking about 19 months ago. Her smoking use included Cigarettes. She has a 40 pack-year smoking history. She quit smokeless tobacco use about 19 months ago. She reports that she does not drink alcohol or use illicit drugs. lives alone at home use is oxygen on a when necessary basis  No Known Allergies  Family History  Problem Relation Age of Onset  . Anesthesia problems Neg Hx   .  Heart attack Mother   . Cancer Brother     lung  . Cancer Sister     Hodgkin's, breast to bone  . Alzheimer's disease Sister     Prior to Admission medications   Medication Sig Start Date End Date Taking? Authorizing Provider  aspirin 325 MG tablet Take 325 mg by mouth every morning.    Yes Historical Provider, MD  calcium carbonate (OS-CAL) 600 MG TABS Take 600 mg by mouth every evening.    Yes Historical Provider, MD  Cholecalciferol (VITAMIN D) 2000 UNITS CAPS Take 2,000 Units by mouth every morning.    Yes Historical Provider, MD  citalopram (CELEXA) 40 MG tablet Take 40 mg by mouth every morning.   Yes Historical Provider, MD  dexamethasone (DECADRON) 4 MG tablet Take 2 tabs once a day on the day after chemo then 2 tabs twice daily for 2 days. 01/28/13  Yes Josph Macho, MD  gabapentin (NEURONTIN) 300 MG capsule Take 1  capsule (300 mg total) by mouth 3 (three) times daily. 02/09/13  Yes Josph Macho, MD  ibuprofen (ADVIL,MOTRIN) 200 MG tablet Take 400 mg by mouth every 6 (six) hours as needed for pain.   Yes Historical Provider, MD  levothyroxine (SYNTHROID, LEVOTHROID) 112 MCG tablet Take 112 mcg by mouth daily before breakfast.   Yes Historical Provider, MD  lidocaine-prilocaine (EMLA) cream Apply topically as needed. Apply quarter sized amount onto site of portacath 1-1 1/2 hours prior to using portacath. 01/22/13  Yes Josph Macho, MD  LORazepam (ATIVAN) 0.5 MG tablet Take 0.5 mg by mouth every morning.    Yes Historical Provider, MD  metoprolol tartrate (LOPRESSOR) 25 MG tablet Take 12.5 mg by mouth 2 (two) times daily.    Yes Historical Provider, MD  mirtazapine (REMERON) 15 MG tablet Take 1 tablet (15 mg total) by mouth at bedtime. 02/11/13  Yes Josph Macho, MD  ondansetron (ZOFRAN) 8 MG tablet Take one tablet twice a day as needed for nausea and vomiting starting the 3rd day after chemo. 01/28/13  Yes Josph Macho, MD  oxyCODONE (OXY IR/ROXICODONE) 5 MG immediate release tablet Take 1-2 tablets (5-10 mg total) by mouth every 4 (four) hours as needed for pain. 02/18/13  Yes Josph Macho, MD  oxyCODONE (OXYCONTIN) 10 MG 12 hr tablet Take 1 tablet (10 mg total) by mouth every 12 (twelve) hours. 01/28/13  Yes Josph Macho, MD  pramipexole (MIRAPEX) 0.125 MG tablet Take 1 tablet (0.125 mg total) by mouth 3 (three) times daily. 02/11/13  Yes Ernestina Penna, MD  PRESCRIPTION MEDICATION every 7 (seven) days. Thursdays-Cisplatin ([facility])   Yes Historical Provider, MD  prochlorperazine (COMPAZINE) 10 MG tablet Take 1 tablet (10 mg total) by mouth every 6 (six) hours as needed. 01/22/13  Yes Josph Macho, MD  traZODone (DESYREL) 50 MG tablet Take 25 mg by mouth at bedtime. 12/29/12  Yes Mary-Margaret Daphine Deutscher, FNP   Physical Exam: Filed Vitals:   02/20/13 1055  BP: 178/70  Pulse: 95   Temp: 97.8 F (36.6 C)  Resp: 20     General:  Alert tired-appearing Caucasian female looking about stated age  Eyes: No pallor no icterus moderate dentition  ENT: Soft supple no JVD   Neck: sternomanubrial joint prominent left-sided no JVD no bruit  Cardiovascular: S1-S2 no murmur rub or gallop  Respiratory: Slight crackles on right side which seem to clear with coughing no TVR no TVF  Abdomen:  Soft nontender nondistended  Skin: No lower extremity gross swelling however slight lower extremity edema  Musculoskeletal: Range of motion intact  Psychiatric: Euthymic  Neurologic: Grossly intact moving all 4 limbs equally reflexes 2/3  Labs on Admission:  Basic Metabolic Panel:  Recent Labs Lab 02/20/13 1200  NA 135  K 4.7  CL 103  CO2 25  GLUCOSE 120*  BUN 21  CREATININE 0.84  CALCIUM 8.6   Liver Function Tests: No results found for this basename: AST, ALT, ALKPHOS, BILITOT, PROT, ALBUMIN,  in the last 168 hours No results found for this basename: LIPASE, AMYLASE,  in the last 168 hours No results found for this basename: AMMONIA,  in the last 168 hours CBC:  Recent Labs Lab 02/18/13 0948 02/20/13 1200  WBC 7.4 12.2*  NEUTROABS 4.8 10.6*  HGB 12.3 11.3*  HCT 37.4 34.3*  MCV 97 94.0  PLT 132* 150   Cardiac Enzymes: No results found for this basename: CKTOTAL, CKMB, CKMBINDEX, TROPONINI,  in the last 168 hours  BNP (last 3 results) No results found for this basename: PROBNP,  in the last 8760 hours CBG: No results found for this basename: GLUCAP,  in the last 168 hours  Radiological Exams on Admission: Dg Chest 2 View  02/20/2013   *RADIOLOGY REPORT*  Clinical Data: Hemoptysis  CHEST - 2 VIEW  Comparison: Chest CT and chest radiograph, 01/05/2013  Findings: There is new airspace opacity at the left lung base consistent with pneumonia or other infiltrate.  Volume loss in the left lung and left apical pleural thickening and associated parenchymal  scarring is stable.  The right lung is clear.  The cardiac silhouette is normal in size.  There are changes from left lung surgery and left breast surgery.  These are also stable.  Right anterior chest wall power Port-A-Cath is well positioned.  IMPRESSION: Airspace opacity in the left lung bases consistent with pneumonia or other infiltrate.  No other acute findings.   Original Report Authenticated By: Amie Portland, M.D.   Ct Chest Wo Contrast  02/20/2013   *RADIOLOGY REPORT*  Clinical Data: Two episodes of hemoptysis this morning, coughing up this small amount of bright red blood.  Patient with current history of lung cancer for which she is undergoing chemotherapy and radiation therapy.  CT CHEST WITHOUT CONTRAST  Technique:  Multidetector CT imaging of the chest was performed following the standard protocol without IV contrast.  Comparison: CT images obtained at the time of PET CT 01/21/2013.  Findings: Interval development of airspace opacities throughout most of the left lower lobe, most confluent posteriorly, superimposed upon severe emphysema.  Prior left upper lobectomy. No airspace consolidation in the severely emphysematous right lung. Calcified granuloma in the posterolateral right upper lobe.  No visible noncalcified pulmonary nodules in either lung. Pleuroparenchymal scarring at the left apex, unchanged.  No pleural effusions.  Central airways patent.  Within the limits of the unenhanced technique, no significant mediastinal, hilar, or axillary lymphadenopathy.  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  No visible chest wall metastases elsewhere.  Right jugular Port-A-Cath tip at the cavoatrial junction.  Heart enlarged with left atrial enlargement  particular.  Mitral annular calcification, aortic annular calcification, and mild aortic valvular calcification.  Extensive three-vessel coronary atherosclerosis.  No pericardial effusion.  Extensive thoracic and upper abdominal aortic atherosclerosis without aneurysm.  Visualized upper abdomen unremarkable for the unenhanced technique. Apart from the metastasis involving the left posterior fifth rib and the left transverse process of T5, no evidence of osseous metastatic disease elsewhere.  IMPRESSION:  1.  Airspace consolidation throughout most of the left lower lobe, most confluent posteriorly.  Differential diagnosis would include pneumonia (possibly aspiration), pulmonary hemorrhage in a patient with hemoptysis, radiation pneumonitis if this portion of the lung is included in the radiation field and less likely, chemotherapy effect. 2.  No change to slight interval decrease in size of the chest wall metastasis with involvement of the posterior left fifth rib, left transverse process of T5, with extension into the left posterior pleural space.  3.  No evidence of new metastatic disease elsewhere in the chest. 4.  Severe COPD/emphysema. 5.  Cardiomegaly with left atrial enlargement in particular. Extensive three-vessel coronary atherosclerosis.  Mild aortic valvular calcification which can be seen with aortic stenosis.   Original Report Authenticated By: Hulan Saas, M.D.    EKG: Independently reviewed. None performed  Assessment/Plan Principal Problem:   Hemoptysis Active Problems:   Breast cancer, s/p Mstectomy and XRT 1996   CVA (cerebral vascular accident)   Pulmonary nodule, left   S/P lobectomy of lung   Hypertension   Hypothyroidism   Rib pain on left side  1  1. Hemoptysis + phlegm-differential diagnosis = radiation pneumonitis vs. healthcare acquired pneumonia. Chest x-ray and CT scan performed however without contrast-given vancomycin/Zosyn in the ED , transition to cefepime and  vancomycin we'll repeat a chest x-ray in the morning to determine if there is any spread-of course if she has more hemoptysis or other issues, this would then point 2 more for radiation pneumonitis and I believe she can be tapered rapidly off of IV antibiotics to monotherapy. Last admission she received antibiotics as well for suspected pneumonia-I will hold her ibuprofen and aspirin as this may be causative 2. Leukocytosis-potentially related to #1 but also on decadron so could also be de-margination 3. Recurrent non--small cell carcinoma-per oncology. Would hold chemotherapy but continue Decadron for now.  I will place Dr. Gustavo Lah name on consult list but do not think that we will need oncology input at this point. 4. Situational anxiety-continue citalopram 40 mg every morning, lorazepam 0.5 every morning, trazodone 50 mg each bedtime 5. Restless leg syndrome, continue pramipexole 0.5 3 times a day 6. Chronic pain secondary to number 2-continue oxycodone 5-10 mg every 4 when necessary moderate pain, OxyContin 10 mg every 12 hourly 7. Hypertension-continue metoprolol 12.5 twice a day-may need to increase or add patients for this 8. Breast cancer status post mastectomy and XRT 1996 9. 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 Remiote H/o CVA-details unclear-Hold ASA 325 for now  Informally called Radiologist Dr. Lyman Bishop to over-read films-He thinks Radiation Pneumonitis is as Likely as PNA if patient has received full lung field therapy-he recommends routine CXR as f/u and as best follow up imaging study-Will CC Dr. Darleene Cleaver for his thoughts on her case and whether narrower fraction of XRT can be used to Rx this  Full Code Discussed with daughter bedisde all detialsa nd explained with help of Ct scan etc and. etc Inpt-Med surg   Time spent: 33  Mahala Menghini [facility] Pager [phone]  If 7PM-7AM, please contact night-coverage [email or web address] Password  [facility] 02/20/2013, 3:22 PM

## 2013-02-20 NOTE — ED Provider Notes (Signed)
CSN: [id number]     Arrival date & time 02/20/13  1052 History     First MD Initiated Contact with Patient 02/20/13 1054     Chief Complaint  Patient presents with  . 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 She reports yesterday she was outside in the heat taking care of her tomato plants. She states since then she started having a cough which she has not had recently. She states she is coughing up a small amount of white phlegm however this morning she had 2 episodes of quarter-sized blood that was bright red,. She states she has chronic dyspnea on exertion. She denies fever, chest pain, abdominal pain, nausea, vomiting, or diarrhea. She states she's never had this before. The last episode of coughing up blood with at 9:45. Pt only takes a regular aspirin for blood thinner. She does take decadron with her chemotherapy.    PCP Dr Christell Constant Oncologist Dr Myna Hidalgo  Past Medical History  Diagnosis Date  . 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 Pulmonary nodule, left 08/26/2011  . Hypertension   . Restless leg syndrome   . Peripheral vascular disease   . Carotid artery stenosis     bilateral  . Glomus tumor      right  . Shortness of breath     exertional  . Hypothyroidism   . Cancer     breast, bilateral s/p mast and chemo  . Shoulder pain, left     due to vascular dz  . Left hip pain     when ambulatory  . Episodic low back pain   . Depression     stress related  . Hard of hearing   . Hx of radiation therapy 06/04/95- 07/21/95    left chest wall, l axilla, l supraclavicular area  . Breast cancer 1996    left  . Breast cancer 1973    right, mastectomy  . Lung cancer 08/2011, 12/2012    left upper lobe,  recurrence 2014   Past Surgical History  Procedure Laterality Date  . Breast surgery  [id number]    bilateral mastectomy  . Portacath placement  1996    removed after chemo  . Vascular stent  01/07/2004    left subclavian  . Mastectomy      bilateral  . Tubal ligation    . Tonsillectomy      age 41  . Vascular surgery    . Lung cancer surgery Left 09/17/11    wedge resection, LUL  . Lung cancer surgery Left 09/21/11    lung resection   Family History  Problem Relation Age of Onset  . Anesthesia problems Neg Hx   . Heart attack Mother   . Cancer Brother     lung  . Cancer Sister     Hodgkin's, breast to bone  . Alzheimer's disease Sister    History  Substance Use Topics  . Smoking status: Former Smoker -- 1.00 packs/day for 40 years    Types: Cigarettes    Quit date: 07/09/2011  . Smokeless tobacco: Former Neurosurgeon    Quit date: 07/09/2011     Comment: electronic cigarette daily, 01/25/13 no smoking  . Alcohol Use: No  lives next to daughter On oxygen 2 lpm [place]   OB History  Grav Para Term Preterm Abortions TAB SAB Ect Mult Living                 Review of Systems  All other systems reviewed and are negative.    Allergies  Review of patient's allergies indicates no known allergies.  Home Medications   Current Outpatient Rx  Name  Route  Sig  Dispense  Refill  . aspirin 325 MG tablet   Oral   Take 325 mg by mouth daily.         Marland Kitchen atorvastatin (LIPITOR) 40 MG tablet   Oral   Take 40 mg by mouth every morning.         . calcium carbonate (OS-CAL) 600 MG TABS   Oral   Take 600 mg by mouth daily with breakfast.         . Cholecalciferol (VITAMIN D) 2000 UNITS CAPS   Oral   Take 2,000 Units by mouth daily.          . citalopram (CELEXA) 40 MG tablet   Oral   Take 40 mg by mouth every morning.         Marland Kitchen dexamethasone (DECADRON) 4 MG tablet      Take 2 tabs once a day on the day after chemo then 2 tabs twice daily for 2 days.   30 tablet    1   . gabapentin (NEURONTIN) 300 MG capsule   Oral   Take 1 capsule (300 mg total) by mouth 3 (three) times daily.   270 capsule   2     Ok to provide a 90 day supply   . ibuprofen (ADVIL,MOTRIN) 200 MG tablet   Oral   Take 400 mg by mouth every 6 (six) hours as needed for pain.         Marland Kitchen levothyroxine (SYNTHROID, LEVOTHROID) 112 MCG tablet   Oral   Take 112 mcg by mouth daily before breakfast.         . lidocaine-prilocaine (EMLA) cream   Topical   Apply topically as needed. Apply quarter sized amount onto site of portacath 1-1 1/2 hours prior to using portacath.   30 g   6   . LORazepam (ATIVAN) 0.5 MG tablet   Oral   Take 0.5 mg by mouth daily.         . metoprolol tartrate (LOPRESSOR) 25 MG tablet   Oral   Take 12.5 mg by mouth 2 (two) times daily. TAKES 1/2 TABLET         . mirtazapine (REMERON) 15 MG tablet   Oral   Take 1 tablet (15 mg total) by mouth at bedtime.   30 tablet   4   . Multiple Minerals-Vitamins (CALCIUM & VIT D3 BONE HEALTH PO)   Oral   Take by mouth every morning.         . ondansetron (ZOFRAN) 8 MG tablet      Take one tablet twice a day as needed for nausea and vomiting starting the 3rd day after chemo.   30 tablet   1   . oxyCODONE (OXY IR/ROXICODONE) 5 MG immediate release tablet   Oral   Take 1-2 tablets (5-10 mg total) by mouth every 4 (four) hours as needed for pain.   60 tablet   0   . oxyCODONE (OXYCONTIN) 10 MG 12 hr tablet   Oral   Take 1 tablet (10 mg total) by mouth every 12 (twelve) hours.   60  tablet   0   . pramipexole (MIRAPEX) 0.125 MG tablet   Oral   Take 1 tablet (0.125 mg total) by mouth 3 (three) times daily.   270 tablet   1   . prochlorperazine (COMPAZINE) 10 MG tablet   Oral   Take 1 tablet (10 mg total) by mouth every 6 (six) hours as needed.   30 tablet   3   . traZODone (DESYREL) 50 MG tablet   Oral   Take 1 tablet (50 mg total) by mouth at bedtime.   30 tablet   5    BP 178/70   Pulse 95  Temp(Src) 97.8 F (36.6 C) (Oral)  Resp 20  SpO2 98%  Vital signs normal   Physical Exam  Nursing note and vitals reviewed. Constitutional: She is oriented to person, place, and time. She appears well-developed and well-nourished.  Non-toxic appearance. She does not appear ill. No distress.  HENT:  Head: Normocephalic and atraumatic.  Right Ear: External ear normal.  Left Ear: External ear normal.  Nose: Nose normal. No mucosal edema or rhinorrhea.  Mouth/Throat: Oropharynx is clear and moist and mucous membranes are normal. No dental abscesses or edematous.  Tongue coated, but moist  Eyes: Conjunctivae and EOM are normal. Pupils are equal, round, and reactive to light.  Neck: Normal range of motion and full passive range of motion without pain. Neck supple.  Cardiovascular: Normal rate, regular rhythm and normal heart sounds.  Exam reveals no gallop and no friction rub.   No murmur heard. Pulmonary/Chest: Effort normal and breath sounds normal. No respiratory distress. She has no wheezes. She has no rhonchi. She has no rales. She exhibits no tenderness and no crepitus.  Abdominal: Soft. Normal appearance and bowel sounds are normal. She exhibits no distension. There is no tenderness. There is no rebound and no guarding.  Musculoskeletal: Normal range of motion. She exhibits no edema and no tenderness.  Moves all extremities well.   Neurological: She is alert and oriented to person, place, and time. She has normal strength. No cranial nerve deficit.  Skin: Skin is warm, dry and intact. No rash noted. No erythema. No pallor.  Psychiatric: Her speech is normal and behavior is normal. Her mood appears not anxious.  Flat fascies    ED Course   Medications  vancomycin (VANCOCIN) IVPB 1000 mg/200 mL premix (1,000 mg Intravenous New Bag/Given 02/20/13 1529)  piperacillin-tazobactam (ZOSYN) IVPB 3.375 g (0 g Intravenous Stopped 02/20/13 1450)     Procedures (including  critical care time)  Discussed results of labs and CXR, agreeable to CT scan  15:32 Dr Mahala Menghini will see patient med-surg  Results for orders placed during the hospital encounter of 02/20/13  CBC WITH DIFFERENTIAL      Result Value Range   WBC 12.2 (*) 4.0 - 10.5 K/uL   RBC 3.65 (*) 3.87 - 5.11 MIL/uL   Hemoglobin 11.3 (*) 12.0 - 15.0 g/dL   HCT 16.1 (*) 09.6 - 04.5 %   MCV 94.0  78.0 - 100.0 fL   MCH 31.0  26.0 - 34.0 pg   MCHC 32.9  30.0 - 36.0 g/dL   RDW 40.9  81.1 - 91.4 %   Platelets 150  150 - 400 K/uL   Neutrophils Relative % 87 (*) 43 - 77 %   Neutro Abs 10.6 (*) 1.7 - 7.7 K/uL   Lymphocytes Relative 4 (*) 12 - 46 %   Lymphs Abs 0.5 (*) 0.7 - 4.0 K/uL  Monocytes Relative 9  3 - 12 %   Monocytes Absolute 1.1 (*) 0.1 - 1.0 K/uL   Eosinophils Relative 0  0 - 5 %   Eosinophils Absolute 0.0  0.0 - 0.7 K/uL   Basophils Relative 0  0 - 1 %   Basophils Absolute 0.0  0.0 - 0.1 K/uL  BASIC METABOLIC PANEL      Result Value Range   Sodium 135  135 - 145 mEq/L   Potassium 4.7  3.5 - 5.1 mEq/L   Chloride 103  96 - 112 mEq/L   CO2 25  19 - 32 mEq/L   Glucose, Bld 120 (*) 70 - 99 mg/dL   BUN 21  6 - 23 mg/dL   Creatinine, Ser 1.61  0.50 - 1.10 mg/dL   Calcium 8.6  8.4 - 09.6 mg/dL   GFR calc non Af Amer 70 (*) >90 mL/min   GFR calc Af Amer 81 (*) >90 mL/min   Laboratory interpretation all normal except  Leukocytosis since 2 days ago, mildly worsening anemia     Dg Chest 2 View  02/20/2013   *RADIOLOGY REPORT*  Clinical Data: Hemoptysis  CHEST - 2 VIEW  Comparison: Chest CT and chest radiograph, 01/05/2013  Findings: There is new airspace opacity at the left lung base consistent with pneumonia or other infiltrate.  Volume loss in the left lung and left apical pleural thickening and associated parenchymal scarring is stable.  The right lung is clear.  The cardiac silhouette is normal in size.  There are changes from left lung surgery and left breast surgery.  These are also  stable.  Right anterior chest wall power Port-A-Cath is well positioned.  IMPRESSION: Airspace opacity in the left lung bases consistent with pneumonia or other infiltrate.  No other acute findings.   Original Report Authenticated By: Amie Portland, M.D.   Ct Chest Wo Contrast  02/20/2013   *RADIOLOGY REPORT*  Clinical Data: Two episodes of hemoptysis this morning, coughing up this small amount of bright red blood.  Patient with current history of lung cancer for which she is undergoing chemotherapy and radiation therapy.  CT CHEST WITHOUT CONTRAST  Technique:  Multidetector CT imaging of the chest was performed following the standard protocol without IV contrast.  Comparison: CT images obtained at the time of PET CT 01/21/2013.  Findings: Interval development of airspace opacities throughout most of the left lower lobe, most confluent posteriorly, superimposed upon severe emphysema.  Prior left upper lobectomy. No airspace consolidation in the severely emphysematous right lung. Calcified granuloma in the posterolateral right upper lobe.  No visible noncalcified pulmonary nodules in either lung. Pleuroparenchymal scarring at the left apex, unchanged.  No pleural effusions.  Central airways patent.  Within the limits of the unenhanced technique, no significant mediastinal, hilar, or axillary lymphadenopathy.  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  No visible chest wall metastases elsewhere.  Right jugular Port-A-Cath tip at the cavoatrial junction.  Heart enlarged with left atrial enlargement particular.  Mitral annular calcification, aortic annular calcification, and mild aortic valvular calcification.  Extensive three-vessel coronary atherosclerosis.  No  pericardial effusion.  Extensive thoracic and upper abdominal aortic atherosclerosis without aneurysm.  Visualized upper abdomen unremarkable for the unenhanced technique.  Apart from the metastasis involving the left posterior fifth rib and the left transverse process of T5, no evidence of osseous metastatic disease elsewhere.  IMPRESSION:  1.  Airspace consolidation throughout most of the left lower lobe, most confluent posteriorly.  Differential diagnosis would include pneumonia (possibly aspiration), pulmonary hemorrhage in a patient with hemoptysis, radiation pneumonitis if this portion of the lung is included in the radiation field and less likely, chemotherapy effect. 2.  No change to slight interval decrease in size of the chest wall metastasis with involvement of the posterior left fifth rib, left transverse process of T5, with extension into the left posterior pleural space.  3.  No evidence of new metastatic disease elsewhere in the chest. 4.  Severe COPD/emphysema. 5.  Cardiomegaly with left atrial enlargement in particular. Extensive three-vessel coronary atherosclerosis.  Mild aortic valvular calcification which can be seen with aortic stenosis.   Original Report Authenticated By: Hulan Saas, M.D.   1. Healthcare-associated pneumonia   2. Hemoptysis     Plan admission   Devoria Albe, MD, FACEP   MDM    Ward Givens, MD 02/20/13 1539

## 2013-02-20 NOTE — ED Notes (Signed)
Pt reports hx of lung cancer with mets to spine, states her last chemo was Thursday and had radiation yesterday. Reports 2 episodes of coughing up small amount of bright red blood this morning. Pt called her oncologist, who advised her to come into ED. Pt is seen at [facility]

## 2013-02-20 NOTE — Progress Notes (Signed)
ANTIBIOTIC CONSULT NOTE - INITIAL  Pharmacy Consult for vancomycin/cefepime Indication: HCAP  No Known Allergies  Patient Measurements: Height: 5\' 6"  (167.6 cm) Weight: 172 lb 6.4 oz (78.2 kg) IBW/kg (Calculated) : 59.3  Vital Signs: Temp: 97.7 F (36.5 C) (08/23 1630) Temp src: Oral (08/23 1630) BP: 182/78 mmHg (08/23 1630) Pulse Rate: 90 (08/23 1630) Intake/Output from previous day:   Intake/Output from this shift:    Labs:  Recent Labs  02/18/13 0948 02/20/13 1200  WBC 7.4 12.2*  HGB 12.3 11.3*  PLT 132* 150  CREATININE  --  0.84   Estimated Creatinine Clearance: 67.7 ml/min (by C-G formula based on Cr of 0.84). No results found for this basename: VANCOTROUGH, VANCOPEAK, VANCORANDOM, GENTTROUGH, GENTPEAK, GENTRANDOM, TOBRATROUGH, TOBRAPEAK, TOBRARND, AMIKACINPEAK, AMIKACINTROU, AMIKACIN,  in the last 72 hours   Microbiology: No results found for this or any previous visit (from the past 720 hour(s)).  Medical History: Past Medical History  Diagnosis Date  . 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 Pulmonary nodule, left 08/26/2011  . Hypertension   . Restless leg syndrome   . Peripheral vascular disease   . Carotid artery stenosis     bilateral  . Glomus tumor      right  . Shortness of breath     exertional  . Hypothyroidism   . Cancer     breast, bilateral s/p mast and chemo  . Shoulder pain, left     due to vascular dz  . Left hip pain     when ambulatory  . Episodic low back pain   . Depression     stress related  . Hard of hearing   . Hx of radiation therapy 06/04/95- 07/21/95    left chest wall, l axilla, l supraclavicular area  . Breast cancer 1996    left  . Breast cancer 1973    right, mastectomy  . Lung cancer 08/2011, 12/2012    left upper lobe, recurrence 2014    Medications:  Scheduled:  . ceFEPime (MAXIPIME) IV  1 g Intravenous Q8H  . [START ON 02/21/2013] citalopram  40 mg Oral q morning - 10a  . gabapentin  300 mg Oral TID  .  [START ON 02/21/2013] levothyroxine  112 mcg Oral QAC breakfast  . [START ON 02/21/2013] LORazepam  0.5 mg Oral q morning - 10a  . metoprolol tartrate  12.5 mg Oral BID  . mirtazapine  15 mg Oral QHS  . OxyCODONE  10 mg Oral Q12H  . pramipexole  0.125 mg Oral TID  . traZODone  50 mg Oral QHS   Infusions:    Assessment: 67 yo with recurrent NSCLC on chemo/XRT presented to ER with hemoptysis found to have a HCAP to treat with vancomycin/cefepime per pharmacy dosing  CrCl est 68 ml/min   Goal of Therapy:  Vancomycin trough level 15-20 mcg/ml  Plan:  1) Vancomycin 1g IV q12 2) Continue Cefepime 1g IV q8 as already ordered   Hessie Knows, PharmD, BCPS Pager [phone] 02/20/2013 4:54 PM

## 2013-02-21 ENCOUNTER — Inpatient Hospital Stay (HOSPITAL_COMMUNITY): Payer: Medicare Other

## 2013-02-21 DIAGNOSIS — I10 Essential (primary) hypertension: Secondary | ICD-10-CM

## 2013-02-21 DIAGNOSIS — Z923 Personal history of irradiation: Secondary | ICD-10-CM

## 2013-02-21 DIAGNOSIS — C3490 Malignant neoplasm of unspecified part of unspecified bronchus or lung: Secondary | ICD-10-CM

## 2013-02-21 DIAGNOSIS — G2581 Restless legs syndrome: Secondary | ICD-10-CM

## 2013-02-21 DIAGNOSIS — R042 Hemoptysis: Secondary | ICD-10-CM

## 2013-02-21 DIAGNOSIS — E785 Hyperlipidemia, unspecified: Secondary | ICD-10-CM

## 2013-02-21 DIAGNOSIS — I6350 Cerebral infarction due to unspecified occlusion or stenosis of unspecified cerebral artery: Secondary | ICD-10-CM

## 2013-02-21 LAB — EXPECTORATED SPUTUM ASSESSMENT W GRAM STAIN, RFLX TO RESP C

## 2013-02-21 LAB — STREP PNEUMONIAE URINARY ANTIGEN: Strep Pneumo Urinary Antigen: NEGATIVE

## 2013-02-21 MED ORDER — SODIUM CHLORIDE 0.9 % IJ SOLN
10.0000 mL | INTRAMUSCULAR | Status: DC | PRN
  Administered 2013-02-23: 10 mL

## 2013-02-21 NOTE — Progress Notes (Signed)
Report received from J. McLaughlin. No change since initial pm assessment. Will continue to follow the plan of care.  

## 2013-02-21 NOTE — Progress Notes (Signed)
Several family members in visiting. Pt c/o pain under Lt. Axillary, medicated with Oxy IR. Pt did get relief.

## 2013-02-21 NOTE — Progress Notes (Signed)
[facility] PROGRESS NOTE  Katelyn [name] BMW:[id number] DOB: 1946/06/01 DOA: 02/20/2013 PCP: [name], MD  Assessment/Plan: 1. Hemoptysis - Most likely due to radiation pneumonitis  - Does have leukocytosis but does not report persistent sputum production or worsening SOB. As such index of suspicion for infectious etiology is low. - Continue to monitor H/H. Transfuse if hgb < 7.9 - Likely simplify antibiotic regimen next am.  2. Leukocytosis - most likely due to decadron - Will continue to monitor while patient is on antibiotics.  3. Recurrent non-small cell carcinoma - Dr. Myna Hidalgo on board as outpatient.  Patient has had oncology evaluation. - Oncology on board and will await further evaluation and recommendations from oncologist.  4. RLS - stable on home regimen  5. HTN - continue current regimen of metoprolol. Should blood pressures remain persistently elevated will plan on increasing dose.  6. Remote h/o CVA - given # 1 agree with holding aspirin at this point  7. 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 Disposition Plan: Pending resolution of hemoptysis.   Consultants:  Oncology  Procedures:  None  Antibiotics:  Cefepime and vancomycin  HPI/Subjective: Patient has questions regarding her current cancer diagnosis.  Otherwise has no new complaints.  Denies any fever or chills.  Objective: Filed Vitals:   02/21/13 0454  BP: 154/70  Pulse: 77  Temp: 98.3 F (36.8 C)  Resp: 14    Intake/Output Summary (Last 24 hours) at 02/21/13 1420 Last data filed at 02/21/13 1300  Gross per 24 hour  Intake    240 ml  Output      0 ml  Net    240 ml   Filed Weights   02/20/13 1630  Weight: 78.2 kg (172 lb 6.4 oz)    Exam:   General:  Pt in NAD, Alert and Awake  Cardiovascular: RRR, no  MRG  Respiratory: No wheezes, no increased work of breathing, nasal canula in place  Abdomen: soft, NT, ND  Musculoskeletal: no cyanosis or clubbing   Data Reviewed: Basic Metabolic Panel:  Recent Labs Lab 02/20/13 1200  NA 135  K 4.7  CL 103  CO2 25  GLUCOSE 120*  BUN 21  CREATININE 0.84  CALCIUM 8.6   Liver Function Tests: No results found for this basename: AST, ALT, ALKPHOS, BILITOT, PROT, ALBUMIN,  in the last 168 hours No results found for this basename: LIPASE, AMYLASE,  in the last 168 hours No results found for this basename: AMMONIA,  in the last 168 hours CBC:  Recent Labs Lab 02/18/13 0948 02/20/13 1200  WBC 7.4 12.2*  NEUTROABS 4.8 10.6*  HGB 12.3 11.3*  HCT 37.4 34.3*  MCV 97 94.0  PLT 132* 150   Cardiac Enzymes: No results found for this basename: CKTOTAL, CKMB, CKMBINDEX, TROPONINI,  in the last 168 hours BNP (last 3 results) No results found for this basename: PROBNP,  in the last 8760 hours CBG: No results found for this basename: GLUCAP,  in the last 168 hours  Recent Results (from the past 240 hour(s))  CULTURE, BLOOD (ROUTINE X 2)     Status: None   Collection Time    02/20/13  5:13 PM      Result Value Range Status   Specimen Description BLOOD RIGHT ARM   Final   Special Requests BOTTLES DRAWN AEROBIC AND ANAEROBIC    Final   Culture  Setup Time     Final   Value: 02/20/2013 21:05     Performed at [facility]   Culture     Final   Value:        BLOOD CULTURE RECEIVED NO GROWTH TO DATE CULTURE WILL BE HELD FOR 5 DAYS BEFORE ISSUING A FINAL NEGATIVE REPORT     Performed at [facility]   Report Status PENDING   Incomplete  CULTURE, BLOOD (ROUTINE X 2)     Status: None   Collection Time    02/20/13  5:19 PM      Result Value Range Status   Specimen Description BLOOD LEFT ARM   Final   Special Requests BOTTLES DRAWN AEROBIC AND ANAEROBIC    Final   Culture  Setup Time     Final   Value: 02/20/2013 21:05      Performed at [facility]   Culture     Final   Value:        BLOOD CULTURE RECEIVED NO GROWTH TO DATE CULTURE WILL BE HELD FOR 5 DAYS BEFORE ISSUING A FINAL NEGATIVE REPORT     Performed at [facility]   Report Status PENDING   Incomplete     Studies: Dg Chest 2 View  02/21/2013   *RADIOLOGY REPORT*  Clinical Data: Shortness of breath, history hypertension  CHEST - 2 VIEW  Comparison: 02/20/2013  Findings: Surgical clips left axilla question prior axillary lymph node dissection. Right side Port-A-Cath stable tip projecting over SVC. Normal heart size, mediastinal contours, and pulmonary vascularity. Atherosclerotic calcification aorta. Volume loss in left hemithorax with basilar infiltrate consistent with pneumonia. Left apical pleural thickening and postsurgical changes in left upper lobe again identified. Vascular stent identified in the left superior mediastinum. Underlying emphysematous changes. Bones diffusely demineralized.  IMPRESSION: Changes of COPD and prior left lung surgery with persistent airspace consolidation at the left lung base.   Original Report Authenticated By: Ulyses Southward, M.D.   Dg Chest 2 View  02/20/2013   *RADIOLOGY REPORT*  Clinical Data: Hemoptysis  CHEST - 2 VIEW  Comparison: Chest CT and chest radiograph, 01/05/2013  Findings: There is new airspace opacity at the left lung base consistent with pneumonia or other infiltrate.  Volume loss in the left lung and left apical pleural thickening and associated parenchymal scarring is stable.  The right lung is clear.  The cardiac silhouette is normal in size.  There are changes from left lung surgery and left breast surgery.  These are also stable.  Right anterior chest wall power Port-A-Cath is well positioned.  IMPRESSION: Airspace opacity in the left lung bases consistent with pneumonia or other infiltrate.  No other acute findings.   Original Report Authenticated By: Amie Portland, M.D.   Ct Chest Wo  Contrast  02/20/2013   *RADIOLOGY REPORT*  Clinical Data: Two episodes of hemoptysis this morning, coughing up this small amount of bright red blood.  Patient with current history of lung cancer for which she is undergoing chemotherapy and radiation therapy.  CT CHEST WITHOUT CONTRAST  Technique:  Multidetector CT imaging of the chest was performed following the standard protocol without IV contrast.  Comparison: CT images obtained at the time of PET CT 01/21/2013.  Findings: Interval development of airspace opacities throughout most of the left lower lobe, most confluent posteriorly, superimposed upon severe emphysema.  Prior left upper lobectomy. No airspace consolidation in the severely emphysematous  right lung. Calcified granuloma in the posterolateral right upper lobe.  No visible noncalcified pulmonary nodules in either lung. Pleuroparenchymal scarring at the left apex, unchanged.  No pleural effusions.  Central airways patent.  Within the limits of the unenhanced technique, no significant mediastinal, hilar, or axillary lymphadenopathy.  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  No visible chest wall metastases elsewhere.  Right jugular Port-A-Cath tip at the cavoatrial junction.  Heart enlarged with left atrial enlargement particular.  Mitral annular calcification, aortic annular calcification, and mild aortic valvular calcification.  Extensive three-vessel coronary atherosclerosis.  No pericardial effusion.  Extensive thoracic and upper abdominal aortic atherosclerosis without aneurysm.  Visualized upper abdomen unremarkable for the unenhanced technique. Apart from the metastasis involving the left posterior fifth rib and the left transverse process of T5,  no evidence of osseous metastatic disease elsewhere.  IMPRESSION:  1.  Airspace consolidation throughout most of the left lower lobe, most confluent posteriorly.  Differential diagnosis would include pneumonia (possibly aspiration), pulmonary hemorrhage in a patient with hemoptysis, radiation pneumonitis if this portion of the lung is included in the radiation field and less likely, chemotherapy effect. 2.  No change to slight interval decrease in size of the chest wall metastasis with involvement of the posterior left fifth rib, left transverse process of T5, with extension into the left posterior pleural space.  3.  No evidence of new metastatic disease elsewhere in the chest. 4.  Severe COPD/emphysema. 5.  Cardiomegaly with left atrial enlargement in particular. Extensive three-vessel coronary atherosclerosis.  Mild aortic valvular calcification which can be seen with aortic stenosis.   Original Report Authenticated By: Hulan Saas, M.D.    Scheduled Meds: . ceFEPime (MAXIPIME) IV  1 g Intravenous Q8H  . citalopram  40 mg Oral q morning - 10a  . gabapentin  300 mg Oral TID  . levothyroxine  112 mcg Oral QAC breakfast  . LORazepam  0.5 mg Oral q morning - 10a  . metoprolol tartrate  12.5 mg Oral BID  . mirtazapine  15 mg Oral QHS  . OxyCODONE  10 mg Oral Q12H  . pramipexole  0.125 mg Oral TID  . traZODone  50 mg Oral QHS  . vancomycin  1,000 mg Intravenous Q12H   Continuous Infusions:   Principal Problem:   Hemoptysis Active Problems:   Breast cancer, s/p Mstectomy and XRT 1996   CVA (cerebral vascular accident)   Pulmonary nodule, left   S/P lobectomy of lung   Hypertension   Hypothyroidism   Rib pain on left side   HTN (hypertension)    Time spent: > 35 minutes    [name]  [facility] Pager [phone] If 7PM-7AM, please contact night-coverage at [email or web address], password [facility] 02/21/2013, 2:20 PM  LOS: 1 day

## 2013-02-22 ENCOUNTER — Ambulatory Visit: Payer: Medicare Other

## 2013-02-22 ENCOUNTER — Encounter: Payer: Self-pay | Admitting: Radiation Oncology

## 2013-02-22 ENCOUNTER — Other Ambulatory Visit: Payer: Medicare Other | Admitting: Lab

## 2013-02-22 ENCOUNTER — Telehealth: Payer: Self-pay | Admitting: *Deleted

## 2013-02-22 DIAGNOSIS — C3410 Malignant neoplasm of upper lobe, unspecified bronchus or lung: Secondary | ICD-10-CM

## 2013-02-22 DIAGNOSIS — R911 Solitary pulmonary nodule: Secondary | ICD-10-CM

## 2013-02-22 DIAGNOSIS — C50919 Malignant neoplasm of unspecified site of unspecified female breast: Secondary | ICD-10-CM

## 2013-02-22 DIAGNOSIS — E039 Hypothyroidism, unspecified: Secondary | ICD-10-CM

## 2013-02-22 DIAGNOSIS — J678 Hypersensitivity pneumonitis due to other organic dusts: Secondary | ICD-10-CM

## 2013-02-22 LAB — VANCOMYCIN, TROUGH: Vancomycin Tr: 18.9 ug/mL (ref 10.0–20.0)

## 2013-02-22 LAB — CBC
MCHC: 32.2 g/dL (ref 30.0–36.0)
MCV: 95.4 fL (ref 78.0–100.0)
Platelets: 104 10*3/uL — ABNORMAL LOW (ref 150–400)
RDW: 14.3 % (ref 11.5–15.5)
WBC: 7 10*3/uL (ref 4.0–10.5)

## 2013-02-22 LAB — CREATININE, SERUM: Creatinine, Ser: 0.76 mg/dL (ref 0.50–1.10)

## 2013-02-22 LAB — LEGIONELLA ANTIGEN, URINE: Legionella Antigen, Urine: NEGATIVE

## 2013-02-22 MED ORDER — LEVOFLOXACIN 750 MG PO TABS
750.0000 mg | ORAL_TABLET | Freq: Every day | ORAL | Status: DC
  Administered 2013-02-22 – 2013-02-23 (×2): 750 mg via ORAL
  Filled 2013-02-22 (×2): qty 1

## 2013-02-22 MED ORDER — METHYLPREDNISOLONE SODIUM SUCC 125 MG IJ SOLR
80.0000 mg | Freq: Four times a day (QID) | INTRAMUSCULAR | Status: DC
  Administered 2013-02-22: 80 mg via INTRAVENOUS
  Administered 2013-02-22: 11:00:00 via INTRAVENOUS
  Administered 2013-02-22 – 2013-02-23 (×2): 80 mg via INTRAVENOUS
  Filled 2013-02-22 (×9): qty 1.28

## 2013-02-22 NOTE — Progress Notes (Addendum)
ANTIBIOTIC CONSULT NOTE - FOLLOW UP  Pharmacy Consult for Vancomycin and Cefepime Indication: HCAP  No Known Allergies  Patient Measurements: Height: 5\' 6"  (167.6 cm) Weight: 172 lb 6.4 oz (78.2 kg) IBW/kg (Calculated) : 59.3  Vital Signs: Temp: 98.1 F (36.7 C) (08/25 0612) Temp src: Oral (08/25 0612) BP: 132/75 mmHg (08/25 0612) Pulse Rate: 87 (08/24 2219) Intake/Output from previous day: 08/24 0701 - 08/25 0700 In: 1810 [P.O.:760; IV Piggyback:1050] Out: 300 [Urine:300] Intake/Output from this shift:    Labs:  Recent Labs  02/20/13 1200 02/22/13 0425  WBC 12.2* 7.0  HGB 11.3* 10.6*  PLT 150 104*  CREATININE 0.84 0.Katelyn   Estimated Creatinine Clearance: 71.1 ml/min (by C-G formula based on Cr of 0.Katelyn).  80 ml/min/1.18m2 (normalized)  Recent Labs  02/22/13 0425  VANCOTROUGH 18.9     Microbiology: Recent Results (from the past 720 hour(s))  CULTURE, BLOOD (ROUTINE X 2)     Status: None   Collection Time    02/20/13  5:13 PM      Result Value Range Status   Specimen Description BLOOD RIGHT ARM   Final   Special Requests BOTTLES DRAWN AEROBIC AND ANAEROBIC    Final   Culture  Setup Time     Final   Value: 02/20/2013 21:05     Performed at [facility]   Culture     Final   Value:        BLOOD CULTURE RECEIVED NO GROWTH TO [name] CULTURE WILL BE HELD FOR 5 DAYS BEFORE ISSUING A FINAL NEGATIVE REPORT     Performed at [facility]   Report Status PENDING   Incomplete  CULTURE, BLOOD (ROUTINE X 2)     Status: None   Collection Time    02/20/13  5:19 PM      Result Value Range Status   Specimen Description BLOOD LEFT ARM   Final   Special Requests BOTTLES DRAWN AEROBIC AND ANAEROBIC    Final   Culture  Setup Time     Final   Value: 02/20/2013 21:05     Performed at [facility]   Culture     Final   Value:        BLOOD CULTURE RECEIVED NO GROWTH TO [name] CULTURE WILL BE HELD FOR 5 DAYS BEFORE ISSUING A FINAL NEGATIVE REPORT   Performed at [facility]   Report Status PENDING   Incomplete  CULTURE, EXPECTORATED SPUTUM-ASSESSMENT     Status: None   Collection Time    02/21/13  7:05 PM      Result Value Range Status   Specimen Description SPUTUM   Final   Special Requests Immunocompromised   Final   Sputum evaluation     Final   Value: THIS SPECIMEN IS ACCEPTABLE. RESPIRATORY CULTURE REPORT TO FOLLOW.   Report Status 02/21/2013 FINAL   Final    Anti-infectives   Start     Dose/Rate Route Frequency Ordered Stop   02/20/13 1800  ceFEPIme (MAXIPIME) 1 g in dextrose 5 % 50 mL IVPB     1 g 100 mL/hr over 30 Minutes Intravenous Every 8 hours 02/20/13 1640 02/09/2013 1759   02/20/13 1800  vancomycin (VANCOCIN) IVPB 1000 mg/200 mL premix     1,000 mg 200 mL/hr over 60 Minutes Intravenous Every 12 hours 02/20/13 1658 02/15/2013 1759   02/20/13 1300  vancomycin (VANCOCIN) IVPB 1000 mg/200 mL premix     1,000 mg 200 mL/hr  over 60 Minutes Intravenous STAT 02/20/13 1248 02/20/13 1629   02/20/13 1300  piperacillin-tazobactam (ZOSYN) IVPB 3.375 g  Status:  Discontinued     3.375 g 12.5 mL/hr over 240 Minutes Intravenous  Once 02/20/13 1248 02/20/13 1251   02/20/13 1300  piperacillin-tazobactam (ZOSYN) IVPB 3.375 g     3.375 g 100 mL/hr over 30 Minutes Intravenous STAT 02/20/13 1251 02/20/13 1450      Assessment: 67 yo with recurrent NSCLC on chemo/XRT presented to ER with hemoptysis found to have a HCAP.  Day #3 Vancomycin 1g IV q12h / Cefepime 1g IV q8h  Vancomycin trough therapeutic today (18.9 mcg/ml)  SCr stable  WBC improved to wnl  Blood and sputum cultures neg to [name]    Goal of Therapy:  Vancomycin trough level 15-20 mcg/ml  Plan:   Continue Vancomycin 1g IV q12h  Continue Cefepime 1g IV q8h  F/U de-escalation of abx - MD notes may be today  Loralee Pacas, PharmD, BCPS Pager: [phone] 02/22/2013,8:00 AM  Addendum: MD changing Vanc/Cefepime to Levaquin per Pharmacy today. Based on  current renal function, will begin Levaquin 750mg  PO q24h and follow renal function.  Loralee Pacas, PharmD, BCPS 02/22/2013 10:32 AM

## 2013-02-22 NOTE — Progress Notes (Signed)
   Department of Radiation Oncology  Phone:  [phone] Fax:        [phone]  Treatment note  Diagnosis: recurrent lung cancer  Over the weekend the patient presented with hemoptysis. She was admitted to the hospital for this issue. Patient underwent a chest x-ray as well as CT scan.  The patient's chest CT scan showed airspace consolidation throughout much of the left lower lobe.  Differential diagnosis from radiology was pneumonia, pulmonary hemorrhage or radiation pneumonitis.  I carefully reviewed the patient's radiation set up. She is being treated with very limited radiation fields directed as the mass in the left upper  posterior chest (IMRT in light of her previous left chest wall radiation therapy).   The majority of the area of airspace consolidation is outside of her radiation fields. She in addition has only received 28 GY of planned 50 Gy and typically radiation pneumonitis develops 5-6 weeks after therapy is complete.  Therefore the differential diagnosis would most likely include pneumonia or pulmonary hemorrhage.  I did review the patient's most recent chest CT scan images and her radiation set up today with the patient along with her daughter.  I did speak with Dr. Myna Hidalgo this morning and we will hold radiation and chemotherapy this week in the event  she has an infectious process.  We tentatively will restart her therapy on September 2. The patient is agreeable to this break.  -----------------------------------  Billie Lade, PhD, MD

## 2013-02-22 NOTE — Telephone Encounter (Signed)
Pt aware to contact [facility] and have them fax [place]

## 2013-02-22 NOTE — Progress Notes (Signed)
[facility] PROGRESS NOTE  Katelyn [name] WUJ:[id number] DOB: [birth year] DOA: 02/20/2013 PCP: [name], MD  Assessment/Plan: 1. 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 Oncology on board and recommending steroids.  - Does have leukocytosis but does not report persistent sputum production or worsening SOB. As such index of suspicion for infectious etiology is low but will continue antibiotic regimen.  Will simplify antibiotic regimen today 8/25. - Continue to monitor H/H. Transfuse if hgb < 7.9  2. Leukocytosis - most likely due to decadron. Resolved on antibiotics  3. Recurrent non-small cell carcinoma - Oncology on board.   4. RLS - stable on home regimen  5. HTN - continue current regimen of metoprolol.  - relatively well controlled.  6. Remote h/o CVA - given # 1 agree with holding aspirin at this point  7. 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 Disposition Plan: Pending resolution of hemoptysis.   Consultants:  Oncology  Procedures:  None  Antibiotics:  Cefepime and vancomycin >>> 8/25  Levaquin 8/25  HPI/Subjective: No new complaints. Patient feels better. No acute issues overnight.  Objective: Filed Vitals:   02/22/13 0612  BP: 132/75  Pulse:   Temp: 98.1 F (36.7 C)  Resp: 20    Intake/Output Summary (Last 24 hours) at 02/22/13 1236 Last data filed at 02/22/13 9562  Gross per 24 hour  Intake   1570 ml  Output    300 ml  Net   1270 ml   Filed Weights   02/20/13 1630  Weight: 78.2 kg (172 lb 6.4 oz)    Exam:   General:  Pt in NAD, Alert and Awake  Cardiovascular: RRR, no MRG  Respiratory: No wheezes, no increased work of breathing, nasal canula in place  Abdomen: soft, NT, ND  Musculoskeletal: no cyanosis or clubbing   Data Reviewed: Basic  Metabolic Panel:  Recent Labs Lab 02/20/13 1200 02/22/13 0425  NA 135  --   K 4.7  --   CL 103  --   CO2 25  --   GLUCOSE 120*  --   BUN 21  --   CREATININE 0.84 0.76  CALCIUM 8.6  --    Liver Function Tests: No results found for this basename: AST, ALT, ALKPHOS, BILITOT, PROT, ALBUMIN,  in the last 168 hours No results found for this basename: LIPASE, AMYLASE,  in the last 168 hours No results found for this basename: AMMONIA,  in the last 168 hours CBC:  Recent Labs Lab 02/18/13 0948 02/20/13 1200 02/22/13 0425  WBC 7.4 12.2* 7.0  NEUTROABS 4.8 10.6*  --   HGB 12.3 11.3* 10.6*  HCT 37.4 34.3* 32.9*  MCV 97 94.0 95.4  PLT 132* 150 104*   Cardiac Enzymes: No results found for this basename: CKTOTAL, CKMB, CKMBINDEX, TROPONINI,  in the last 168 hours BNP (last 3 results) No results found for this basename: PROBNP,  in the last 8760 hours CBG: No results found for this basename: GLUCAP,  in the last 168 hours  Recent Results (from the past 240 hour(s))  CULTURE, BLOOD (ROUTINE X 2)     Status: None   Collection Time    02/20/13  5:13 PM      Result Value Range Status   Specimen Description BLOOD RIGHT ARM   Final   Special Requests BOTTLES DRAWN AEROBIC  AND ANAEROBIC    Final   Culture  Setup Time     Final   Value: 02/20/2013 21:05     Performed at [facility]   Culture     Final   Value:        BLOOD CULTURE RECEIVED NO GROWTH TO DATE CULTURE WILL BE HELD FOR 5 DAYS BEFORE ISSUING A FINAL NEGATIVE REPORT     Performed at [facility]   Report Status PENDING   Incomplete  CULTURE, BLOOD (ROUTINE X 2)     Status: None   Collection Time    02/20/13  5:19 PM      Result Value Range Status   Specimen Description BLOOD LEFT ARM   Final   Special Requests BOTTLES DRAWN AEROBIC AND ANAEROBIC    Final   Culture  Setup Time     Final   Value: 02/20/2013 21:05     Performed at [facility]   Culture     Final   Value:        BLOOD  CULTURE RECEIVED NO GROWTH TO DATE CULTURE WILL BE HELD FOR 5 DAYS BEFORE ISSUING A FINAL NEGATIVE REPORT     Performed at [facility]   Report Status PENDING   Incomplete  CULTURE, EXPECTORATED SPUTUM-ASSESSMENT     Status: None   Collection Time    02/21/13  7:05 PM      Result Value Range Status   Specimen Description SPUTUM   Final   Special Requests Immunocompromised   Final   Sputum evaluation     Final   Value: THIS SPECIMEN IS ACCEPTABLE. RESPIRATORY CULTURE REPORT TO FOLLOW.   Report Status 02/21/2013 FINAL   Final  CULTURE, RESPIRATORY (NON-EXPECTORATED)     Status: None   Collection Time    02/21/13  7:05 PM      Result Value Range Status   Specimen Description SPUTUM   Final   Special Requests NONE   Final   Gram Stain     Final   Value: ABUNDANT WBC PRESENT,BOTH PMN AND MONONUCLEAR     RARE SQUAMOUS EPITHELIAL CELLS PRESENT     NO ORGANISMS SEEN     Performed at [facility]   Culture PENDING   Incomplete   Report Status PENDING   Incomplete     Studies: Dg Chest 2 View  02/21/2013   *RADIOLOGY REPORT*  Clinical Data: Shortness of breath, history hypertension  CHEST - 2 VIEW  Comparison: 02/20/2013  Findings: Surgical clips left axilla question prior axillary lymph node dissection. Right side Port-A-Cath stable tip projecting over SVC. Normal heart size, mediastinal contours, and pulmonary vascularity. Atherosclerotic calcification aorta. Volume loss in left hemithorax with basilar infiltrate consistent with pneumonia. Left apical pleural thickening and postsurgical changes in left upper lobe again identified. Vascular stent identified in the left superior mediastinum. Underlying emphysematous changes. Bones diffusely demineralized.  IMPRESSION: Changes of COPD and prior left lung surgery with persistent airspace consolidation at the left lung base.   Original Report Authenticated By: Ulyses Southward, M.D.   Ct Chest Wo Contrast  02/20/2013   *RADIOLOGY  REPORT*  Clinical Data: Two episodes of hemoptysis this morning, coughing up this small amount of bright red blood.  Patient with current history of lung cancer for which she is undergoing chemotherapy and radiation therapy.  CT CHEST WITHOUT CONTRAST  Technique:  Multidetector CT imaging of the chest was performed following the standard protocol  without IV contrast.  Comparison: CT images obtained at the time of PET CT 01/21/2013.  Findings: Interval development of airspace opacities throughout most of the left lower lobe, most confluent posteriorly, superimposed upon severe emphysema.  Prior left upper lobectomy. No airspace consolidation in the severely emphysematous right lung. Calcified granuloma in the posterolateral right upper lobe.  No visible noncalcified pulmonary nodules in either lung. Pleuroparenchymal scarring at the left apex, unchanged.  No pleural effusions.  Central airways patent.  Within the limits of the unenhanced technique, no significant mediastinal, hilar, or axillary lymphadenopathy.  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  No visible chest wall metastases elsewhere.  Right jugular Port-A-Cath tip at the cavoatrial junction.  Heart enlarged with left atrial enlargement particular.  Mitral annular calcification, aortic annular calcification, and mild aortic valvular calcification.  Extensive three-vessel coronary atherosclerosis.  No pericardial effusion.  Extensive thoracic and upper abdominal aortic atherosclerosis without aneurysm.  Visualized upper abdomen unremarkable for the unenhanced technique. Apart from the metastasis involving the left posterior fifth rib and the left transverse process of T5, no evidence of osseous metastatic  disease elsewhere.  IMPRESSION:  1.  Airspace consolidation throughout most of the left lower lobe, most confluent posteriorly.  Differential diagnosis would include pneumonia (possibly aspiration), pulmonary hemorrhage in a patient with hemoptysis, radiation pneumonitis if this portion of the lung is included in the radiation field and less likely, chemotherapy effect. 2.  No change to slight interval decrease in size of the chest wall metastasis with involvement of the posterior left fifth rib, left transverse process of T5, with extension into the left posterior pleural space.  3.  No evidence of new metastatic disease elsewhere in the chest. 4.  Severe COPD/emphysema. 5.  Cardiomegaly with left atrial enlargement in particular. Extensive three-vessel coronary atherosclerosis.  Mild aortic valvular calcification which can be seen with aortic stenosis.   Original Report Authenticated By: Hulan Saas, M.D.    Scheduled Meds: . citalopram  40 mg Oral q morning - 10a  . gabapentin  300 mg Oral TID  . levofloxacin  750 mg Oral Daily  . levothyroxine  112 mcg Oral QAC breakfast  . LORazepam  0.5 mg Oral q morning - 10a  . methylPREDNISolone (SOLU-MEDROL) injection  80 mg Intravenous Q6H  . metoprolol tartrate  12.5 mg Oral BID  . mirtazapine  15 mg Oral QHS  . OxyCODONE  10 mg Oral Q12H  . pramipexole  0.125 mg Oral TID  . traZODone  50 mg Oral QHS   Continuous Infusions:   Principal Problem:   Hemoptysis Active Problems:   Breast cancer, s/p Mstectomy and XRT 1996   CVA (cerebral vascular accident)   Pulmonary nodule, left   S/P lobectomy of lung   Hypertension   Hypothyroidism   Rib pain on left side   HTN (hypertension)    Time spent: > 35 minutes    [name]  [facility] Pager [phone] If 7PM-7AM, please contact night-coverage at [email or web address], password [facility] 02/22/2013, 12:36 PM  LOS: 2 days

## 2013-02-22 NOTE — Telephone Encounter (Signed)
Pt in hospital, rm 1501 admitted w/hemoptysis. Per Dr Roselind Messier, pt on treatment break until 03/02/13. [facility] 2 notified, verbalized understanding.

## 2013-02-22 NOTE — Consult Note (Signed)
#   [id number] is consult note.  Katelyn E.

## 2013-02-22 NOTE — Consult Note (Signed)
Katelyn [name], Katelyn [name] NO.:  [id number]  MEDICAL RECORD NO.:  [id number]  LOCATION:  1501                         FACILITY:  [facility]  PHYSICIAN:  Josph Macho, M.D.  DATE OF BIRTH:  [birth year]  DATE OF CONSULTATION:  02/22/2013 DATE OF DISCHARGE:                                CONSULTATION   REFERRING PHYSICIAN:  Pleas Koch, MD  REASON FOR CONSULTATION: 1. 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 2. Hemoptysis.  HISTORY OF PRESENT ILLNESS:  Katelyn [name] is a very nice 67 year old white female.  I know her very well.  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  She has been receiving weekly low-dose cisplatin and radiation therapy.  She basically had left pleural based recurrence.  I think she also has some mediastinal recurrence.  She again has been getting low-dose chemotherapy.  She is getting low- dose weekly cisplatinum.  She has had 4 cycles to date.  I think she has had 3 weeks of radiation.  I saw her in the office last week.  It was probably the best that she has looked in a long time.  She was not complaining of any pain.  She was not coughing.  She is having no shortness of breath.  Her daughter said that before she was admitted, she was out shopping and working in the yard.  She subsequently was admitted because of some hemoptysis.  She again was not having any chest wall pain.  She is having no shortness of breath.  She had a CT scan of the chest done.  This showed development of airspace opacities throughout most of the left lower lobe.  There is severe emphysema.  She had prior left upper lobectomy.  There is no mediastinal, hilar, or axillary lymphadenopathy.  The mets to the left chest wall appeared to be improved.  There is no fluid.  She had no fever.  She was started on IV antibiotics.  We were called to see her to help in management issues.  Again she looks quite good.  When she was admitted, her  hemoglobin was 11.3, hematocrit 34.3, white cell count of 12.2.  She had good renal function.  She had normal PT and PTT.  She did have cultures done from her sputum.  This is pending.  Again she has really not coughed up much since she has been in the hospital.  Her appetite is doing fairly well.  She has had no nausea or vomiting. There has been no diarrhea.  PAST MEDICAL HISTORY: 1. 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 2. Hypertension. 3. Peripheral vascular disease. 4. Carotid artery stenosis. 5. Restless legs syndrome. 6. Hypothyroidism. 7. Situational depression. 8. Remote history of inflammatory carcinoma of the left breast.  ALLERGIES:  None.  ADMISSION MEDICATIONS: 1. Aspirin 325 mg p.o. daily. 2. Celexa 40 mg p.o. daily. 3. Neurontin 300 mg p.o. t.i.d. 4. Synthroid 0.112 mg p.o. daily. 5. Ativan 0.5 mg p.o. q.a.m. 6. Lopressor 12.5 mg p.o. b.i.d. 7. Remeron 15 mg p.o. at bedtime. 8. Zofran 8 mg p.o. b.i.d. p.r.n. 9. OxyContin 10 mg p.o. q.12 hours. 10.OxyIR 5-10 mg p.o. q.4  hours p.r.n. pain. 11.Mirapex 0.125 mg p.o. t.i.d. 12.Trazodone 25 mg p.o. at bedtime p.r.n.  SOCIAL HISTORY:  Remarkable for past tobacco use.  She has no alcohol use.  FAMILY HISTORY:  Noncontributory.  PHYSICAL EXAMINATION:  GENERAL:  This is a well-developed, well- nourished white female, in no obvious distress.  She is alert and oriented x3. VITAL SIGNS:  Showed temperature of 98.1, pulse 87, respiratory rate 20, blood pressure 132/75, oxygen saturation on room air 98%. HEENT:  Head and neck exam shows no ocular or oral lesions.  Pupils react appropriately.  There is no mucositis in the oral cavity.  There is no lymphadenopathy in the neck. LUNGS:  Clear to percussion and auscultation bilaterally.  She has no rales, wheezes, or rhonchi. CARDIAC:  Regular rate and rhythm with normal S1, S2.  There are no murmurs, rubs, or bruits. ABDOMEN:  Soft.  She has good  bowel sounds.  There is no fluid wave. There is no palpable hepatosplenomegaly. EXTREMITIES:  Shows no clubbing, cyanosis, or edema. NEUROLOGICAL:  Shows no focal neurological deficits.  LABORATORY STUDIES:  White cell count 7, hemoglobin 10.6, hematocrit 32.9, platelet count 104.  BUN 21, creatinine 0.76, potassium 4.7.  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  She had a pleural-based recurrence.  This is quite painful.  Looks like she is responding well by the CT scan.  It is hard to say what this consolidation is.  I suspect radiation pneumonitis could be a possibility since the consolidation is in the area of radiation.  I clearly would have Radiation Oncology see her.  Pneumonia is always a possibility.  She is at risk for pneumonia.  Her immune system is not all that compromised by the chemotherapy.  Again she is getting low-dose chemo therapy that really should have very few in the way of side effects.  I would probably get on some steroids.  This is radiation pneumonitis, steroids can help.  She is on aspirin.  I guess I will probably stop the aspirin for right now.  Again she really looks good.  She looks comfortable.  By the CT scan, the tumor does seem to be responding a little bit.  I really appreciate the great care that she is getting.  We will follow her along.     Josph Macho, M.D.     PRE/MEDQ  D:  02/22/2013  T:  02/22/2013  Job:  [id number]

## 2013-02-23 ENCOUNTER — Other Ambulatory Visit: Payer: Self-pay | Admitting: *Deleted

## 2013-02-23 ENCOUNTER — Ambulatory Visit: Payer: Medicare Other

## 2013-02-23 MED ORDER — HEPARIN SOD (PORK) LOCK FLUSH 100 UNIT/ML IV SOLN
500.0000 [IU] | INTRAVENOUS | Status: AC | PRN
  Administered 2013-02-23: 500 [IU]

## 2013-02-23 MED ORDER — LEVOFLOXACIN 750 MG PO TABS: 750.0000 mg | ORAL_TABLET | Freq: Every day | ORAL | Status: AC

## 2013-02-23 NOTE — Discharge Summary (Signed)
Physician Discharge Summary  Katelyn [name]:[id number] DOB: [birth year] DOA: 02/20/2013  PCP: [name], MD  Admit date: 02/20/2013 Discharge date: 02/23/2013  Time spent: > 35 minutes  Recommendations for Outpatient Follow-up:  1. Please follow up on H/H 2. Patient has h/o cva but presented with hemoptysis.  Please decide when to continue aspirin. 3. F/u with pcp in 1-2 weeks.  Discharge Diagnoses:  Principal Problem:   Hemoptysis Active Problems:   Breast cancer, s/p Mstectomy and XRT 1996   CVA (cerebral vascular accident)   Pulmonary nodule, left   S/P lobectomy of lung   Hypertension   Hypothyroidism   Rib pain on left side   HTN (hypertension)   Discharge Condition: stable  Diet recommendation: Low sodium heart healthy  Filed Weights   02/20/13 1630  Weight: 78.2 kg (172 lb 6.4 oz)    History of present illness:  67 y/o with history of recurrent SCC on radiation therapy and chemotherapy who presented with hemoptysis.  Hospital Course:   1. Hemoptysis - After evaluation by radiation oncologist not thought to be 2ary to radiation.  As such steroids discontinued. - resolved on antibiotics. - Leukocytosis improved with antibiotics. - Continue to monitor H/H. Transfuse if hgb < 7.9   2. Leukocytosis  - Resolved on antibiotics and off of steroids.  3. Recurrent non-small cell carcinoma  - Oncology on board.   4. RLS  - stable on home regimen   5. HTN  - continue current regimen of metoprolol.  - relatively well controlled.   6. Remote h/o CVA  - Hold aspirin at this point given hemoptysis  7. 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 Myna Hidalgo  Discharge Exam: Filed Vitals:   02/23/13 0534  BP: 152/74  Pulse: 86  Temp: 98.1 F (36.7 C)  Resp: 18    General: Pt in NAD, Alert and Awake Cardiovascular: RRR, no  MRG Respiratory: CTA BL, no wheezes  Discharge Instructions  Discharge Orders   Future Appointments Provider Department Dept Phone   02/18/2013 9:45 AM Elinor Dodge [facility] [phone]   02/08/2013 10:00 AM [facility] 3 [facility] [phone]   03/02/2013 8:35 AM [facility] [id number] [place] [facility] [phone]   03/03/2013 8:35 AM [facility] [id number] [place] [facility] [phone]   03/04/2013 8:30 AM Rachael Fee [facility] [phone]   03/04/2013 8:45 AM Josph Macho, MD [place] [facility] [phone]   03/04/2013 9:15 AM [facility] 8 [place] [facility] [phone]   03/04/2013 4:05 PM [facility] [id number] [place] [facility] [phone]   03/05/2013 8:35 AM [facility] [id number] [place] [facility] [phone]   03/08/2013 8:35 AM [facility] [id number] [place] [facility] [phone]   03/09/2013 8:35 AM [facility] [id number] [place] [facility] [phone]   03/10/2013 8:35 AM [facility] [id number] [place] [facility] [phone]   03/11/2013 8:15 AM Rachael Fee [facility] [phone]   03/11/2013 8:30 AM [facility] 8 [place] [facility] [phone]   03/11/2013 4:40 PM [facility] [id number] [name] [facility] [phone]   03/12/2013 8:35 AM [facility]  [id number] [place] [facility] [phone]   03/15/2013 8:00 AM [facility] [id number] [place] [facility] [phone]   03/16/2013 8:35 AM [facility] [id number] [place] [facility] [phone]   03/25/2013 10:00 AM Er[facility] [phone]   03/25/2013 10:30 AM [facility] 1 [facility] [phone]   Patient to arrive 15 minutes prior to appointment time.   03/30/2013 10:30 AM Josph Macho, MD [place] [facility] [phone]   Future Orders Complete By Expires   Call MD for:  difficulty breathing, headache or visual disturbances  As directed    Call MD for:  redness, tenderness, or signs of infection (pain, swelling, redness, odor or green/yellow discharge around incision site)  As directed    Call MD for:  temperature >100.4  As directed    Diet - low sodium heart healthy  As directed    Discharge instructions  As directed    Comments:     F/u with Dr. Myna Hidalgo in 1 week.   Increase activity slowly  As directed        Medication List    STOP taking these medications       dexamethasone 4 MG tablet  Commonly known as:  DECADRON     PRESCRIPTION MEDICATION      TAKE these medications       aspirin 325 MG tablet  Take 325 mg by mouth every morning.     calcium carbonate 600 MG Tabs tablet  Commonly known as:  OS-CAL  Take 600 mg by mouth every evening.     citalopram 40 MG tablet  Commonly known as:  CELEXA  Take 40 mg by mouth every morning.     gabapentin 300 MG capsule  Commonly known as:  NEURONTIN  Take 1 capsule (300 mg total) by mouth 3 (three) times daily.     ibuprofen 200 MG tablet  Commonly known as:  ADVIL,MOTRIN  Take 400 mg by mouth every 6 (six) hours as needed for pain.     levofloxacin 750 MG tablet  Commonly known as:  LEVAQUIN  Take 1 tablet (750 mg total) by mouth daily.     levothyroxine 112 MCG tablet  Commonly known as:  SYNTHROID, LEVOTHROID  Take 112 mcg by mouth daily before breakfast.     lidocaine-prilocaine cream  Commonly known as:  EMLA  Apply topically as needed. Apply quarter sized amount onto site of portacath 1-1 1/2 hours prior to using portacath.     LORazepam 0.5 MG tablet   Commonly known as:  ATIVAN  Take 0.5 mg by mouth every morning.     metoprolol tartrate 25 MG tablet  Commonly known as:  LOPRESSOR  Take 12.5 mg by mouth 2 (two) times daily.     mirtazapine 15 MG tablet  Commonly known as:  REMERON  Take 1 tablet (15 mg total) by mouth at bedtime.     ondansetron 8 MG tablet  Commonly known as:  ZOFRAN  Take one tablet twice a day as needed for nausea and vomiting starting the 3rd day after chemo.     oxyCODONE 10 MG 12 hr tablet  Commonly known as:  OXYCONTIN  Take 1 tablet (10 mg total) by mouth every 12 (twelve) hours.     oxyCODONE 5 MG immediate release tablet  Commonly known as:  Oxy IR/ROXICODONE  Take 1-2 tablets (  5-10 mg total) by mouth every 4 (four) hours as needed for pain.     pramipexole 0.125 MG tablet  Commonly known as:  MIRAPEX  Take 1 tablet (0.125 mg total) by mouth 3 (three) times daily.     prochlorperazine 10 MG tablet  Commonly known as:  COMPAZINE  Take 1 tablet (10 mg total) by mouth every 6 (six) hours as needed.     traZODone 50 MG tablet  Commonly known as:  DESYREL  Take 25 mg by mouth at bedtime.     Vitamin D 2000 UNITS Caps  Take 2,000 Units by mouth every morning.       No Known Allergies    The results of significant diagnostics from this hospitalization (including imaging, microbiology, ancillary and laboratory) are listed below for reference.    Significant Diagnostic Studies: Dg Chest 2 View  02/21/2013   *RADIOLOGY REPORT*  Clinical Data: Shortness of breath, history hypertension  CHEST - 2 VIEW  Comparison: 02/20/2013  Findings: Surgical clips left axilla question prior axillary lymph node dissection. Right side Port-A-Cath stable tip projecting over SVC. Normal heart size, mediastinal contours, and pulmonary vascularity. Atherosclerotic calcification aorta. Volume loss in left hemithorax with basilar infiltrate consistent with pneumonia. Left apical pleural thickening and postsurgical  changes in left upper lobe again identified. Vascular stent identified in the left superior mediastinum. Underlying emphysematous changes. Bones diffusely demineralized.  IMPRESSION: Changes of COPD and prior left lung surgery with persistent airspace consolidation at the left lung base.   Original Report Authenticated By: Ulyses Southward, M.D.   Dg Chest 2 View  02/20/2013   *RADIOLOGY REPORT*  Clinical Data: Hemoptysis  CHEST - 2 VIEW  Comparison: Chest CT and chest radiograph, 01/05/2013  Findings: There is new airspace opacity at the left lung base consistent with pneumonia or other infiltrate.  Volume loss in the left lung and left apical pleural thickening and associated parenchymal scarring is stable.  The right lung is clear.  The cardiac silhouette is normal in size.  There are changes from left lung surgery and left breast surgery.  These are also stable.  Right anterior chest wall power Port-A-Cath is well positioned.  IMPRESSION: Airspace opacity in the left lung bases consistent with pneumonia or other infiltrate.  No other acute findings.   Original Report Authenticated By: Amie Portland, M.D.   Ct Chest Wo Contrast  02/20/2013   *RADIOLOGY REPORT*  Clinical Data: Two episodes of hemoptysis this morning, coughing up this small amount of bright red blood.  Patient with current history of lung cancer for which she is undergoing chemotherapy and radiation therapy.  CT CHEST WITHOUT CONTRAST  Technique:  Multidetector CT imaging of the chest was performed following the standard protocol without IV contrast.  Comparison: CT images obtained at the time of PET CT 01/21/2013.  Findings: Interval development of airspace opacities throughout most of the left lower lobe, most confluent posteriorly, superimposed upon severe emphysema.  Prior left upper lobectomy. No airspace consolidation in the severely emphysematous right lung. Calcified granuloma in the posterolateral right upper lobe.  No visible noncalcified  pulmonary nodules in either lung. Pleuroparenchymal scarring at the left apex, unchanged.  No pleural effusions.  Central airways patent.  Within the limits of the unenhanced technique, no significant mediastinal, hilar, or axillary lymphadenopathy.  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  No visible chest wall metastases elsewhere.  Right jugular Port-A-Cath tip at the cavoatrial junction.  Heart enlarged with left atrial enlargement particular.  Mitral annular calcification, aortic annular calcification, and mild aortic valvular calcification.  Extensive three-vessel coronary atherosclerosis.  No pericardial effusion.  Extensive thoracic and upper abdominal aortic atherosclerosis without aneurysm.  Visualized upper abdomen unremarkable for the unenhanced technique. Apart from the metastasis involving the left posterior fifth rib and the left transverse process of T5, no evidence of osseous metastatic disease elsewhere.  IMPRESSION:  1.  Airspace consolidation throughout most of the left lower lobe, most confluent posteriorly.  Differential diagnosis would include pneumonia (possibly aspiration), pulmonary hemorrhage in a patient with hemoptysis, radiation pneumonitis if this portion of the lung is included in the radiation field and less likely, chemotherapy effect. 2.  No change to slight interval decrease in size of the chest wall metastasis with involvement of the posterior left fifth rib, left transverse process of T5, with extension into the left posterior pleural space.  3.  No evidence of new metastatic disease elsewhere in the chest. 4.  Severe COPD/emphysema. 5.  Cardiomegaly with left atrial enlargement in particular. Extensive three-vessel coronary  atherosclerosis.  Mild aortic valvular calcification which can be seen with aortic stenosis.   Original Report Authenticated By: Hulan Saas, M.D.    Microbiology: Recent Results (from the past 240 hour(s))  CULTURE, BLOOD (ROUTINE X 2)     Status: None   Collection Time    02/20/13  5:13 PM      Result Value Range Status   Specimen Description BLOOD RIGHT ARM   Final   Special Requests BOTTLES DRAWN AEROBIC AND ANAEROBIC    Final   Culture  Setup Time     Final   Value: 02/20/2013 21:05     Performed at [facility]   Culture     Final   Value:        BLOOD CULTURE RECEIVED NO GROWTH TO DATE CULTURE WILL BE HELD FOR 5 DAYS BEFORE ISSUING A FINAL NEGATIVE REPORT     Performed at [facility]   Report Status PENDING   Incomplete  CULTURE, BLOOD (ROUTINE X 2)     Status: None   Collection Time    02/20/13  5:19 PM      Result Value Range Status   Specimen Description BLOOD LEFT ARM   Final   Special Requests BOTTLES DRAWN AEROBIC AND ANAEROBIC    Final   Culture  Setup Time     Final   Value: 02/20/2013 21:05     Performed at [facility]   Culture     Final   Value:        BLOOD CULTURE RECEIVED NO GROWTH TO DATE CULTURE WILL BE HELD FOR 5 DAYS BEFORE ISSUING A FINAL NEGATIVE REPORT     Performed at [facility]   Report Status PENDING   Incomplete  CULTURE, EXPECTORATED SPUTUM-ASSESSMENT     Status: None   Collection Time    02/21/13  7:05 PM      Result Value Range Status   Specimen Description SPUTUM   Final   Special Requests Immunocompromised   Final   Sputum evaluation     Final   Value: THIS SPECIMEN IS ACCEPTABLE. RESPIRATORY CULTURE REPORT TO FOLLOW.  Report Status 02/21/2013 FINAL   Final  CULTURE, RESPIRATORY (NON-EXPECTORATED)     Status: None   Collection Time    02/21/13  7:05 PM      Result Value Range Status   Specimen Description SPUTUM   Final   Special Requests NONE   Final   Gram Stain     Final   Value:  ABUNDANT WBC PRESENT,BOTH PMN AND MONONUCLEAR     RARE SQUAMOUS EPITHELIAL CELLS PRESENT     NO ORGANISMS SEEN     Performed at [facility]   Culture     Final   Value: NORMAL OROPHARYNGEAL FLORA     Performed at [facility]   Report Status PENDING   Incomplete     Labs: Basic Metabolic Panel:  Recent Labs Lab 02/20/13 1200 02/22/13 0425  NA 135  --   K 4.7  --   CL 103  --   CO2 25  --   GLUCOSE 120*  --   BUN 21  --   CREATININE 0.84 0.76  CALCIUM 8.6  --    Liver Function Tests: No results found for this basename: AST, ALT, ALKPHOS, BILITOT, PROT, ALBUMIN,  in the last 168 hours No results found for this basename: LIPASE, AMYLASE,  in the last 168 hours No results found for this basename: AMMONIA,  in the last 168 hours CBC:  Recent Labs Lab 02/18/13 0948 02/20/13 1200 02/22/13 0425  WBC 7.4 12.2* 7.0  NEUTROABS 4.8 10.6*  --   HGB 12.3 11.3* 10.6*  HCT 37.4 34.3* 32.9*  MCV 97 94.0 95.4  PLT 132* 150 104*   Cardiac Enzymes: No results found for this basename: CKTOTAL, CKMB, CKMBINDEX, TROPONINI,  in the last 168 hours BNP: BNP (last 3 results) No results found for this basename: PROBNP,  in the last 8760 hours CBG: No results found for this basename: GLUCAP,  in the last 168 hours     Signed:  [name]  [facility] 02/23/2013, 12:38 PM

## 2013-02-23 NOTE — Progress Notes (Signed)
Katelyn [name] seems to be doing very well today. She's on oral antibiotics now. She's not having any hemoptysis. She's out of bed a little bit more.  I appreciate the note from Dr. Roselind Messier. It does not appear that this is the radiation pneumonitis. The consolidation and is outside the radiation portal. She's not had much radiation that would cause pneumonitis. As such, I will stop the steroids.  We will give her a break from treatment this week. This I think will help improve her overall status.  She might be going home today. I don't have a problem with this as long she's not having any hemoptysis.  She is afebrile. Heart rate 86. Oxygen saturation 95%. Blood pressure 152/74. She has good air movement in her lungs bilaterally. Cardiac exam is regular rate and rhythm. Abdomen is soft.  Again, we will give her a break from treatment this week. I think this will help improve her status.  Is she is to go home today, we'll followup with her in her office next week when she comes in for treatment.   Pete E.

## 2013-02-24 ENCOUNTER — Ambulatory Visit: Payer: Medicare Other

## 2013-02-24 LAB — CULTURE, RESPIRATORY W GRAM STAIN

## 2013-02-25 ENCOUNTER — Ambulatory Visit: Payer: Medicare Other

## 2013-02-25 ENCOUNTER — Other Ambulatory Visit: Payer: Medicare Other | Admitting: Lab

## 2013-02-25 ENCOUNTER — Inpatient Hospital Stay (HOSPITAL_COMMUNITY)
Admission: EM | Admit: 2013-02-25 | Discharge: 2013-03-01 | DRG: 175 | Disposition: E | Payer: Medicare Other | Attending: Internal Medicine | Admitting: Internal Medicine

## 2013-02-25 ENCOUNTER — Encounter (HOSPITAL_COMMUNITY): Payer: Self-pay | Admitting: Emergency Medicine

## 2013-02-25 ENCOUNTER — Emergency Department (HOSPITAL_COMMUNITY): Payer: Medicare Other

## 2013-02-25 ENCOUNTER — Other Ambulatory Visit: Payer: Self-pay

## 2013-02-25 DIAGNOSIS — R0781 Pleurodynia: Secondary | ICD-10-CM

## 2013-02-25 DIAGNOSIS — J9610 Chronic respiratory failure, unspecified whether with hypoxia or hypercapnia: Secondary | ICD-10-CM | POA: Diagnosis present

## 2013-02-25 DIAGNOSIS — Z515 Encounter for palliative care: Secondary | ICD-10-CM

## 2013-02-25 DIAGNOSIS — F329 Major depressive disorder, single episode, unspecified: Secondary | ICD-10-CM | POA: Diagnosis present

## 2013-02-25 DIAGNOSIS — I6529 Occlusion and stenosis of unspecified carotid artery: Secondary | ICD-10-CM | POA: Diagnosis present

## 2013-02-25 DIAGNOSIS — I214 Non-ST elevation (NSTEMI) myocardial infarction: Secondary | ICD-10-CM | POA: Diagnosis present

## 2013-02-25 DIAGNOSIS — I959 Hypotension, unspecified: Secondary | ICD-10-CM | POA: Diagnosis present

## 2013-02-25 DIAGNOSIS — Z85118 Personal history of other malignant neoplasm of bronchus and lung: Secondary | ICD-10-CM

## 2013-02-25 DIAGNOSIS — I2489 Other forms of acute ischemic heart disease: Secondary | ICD-10-CM | POA: Diagnosis present

## 2013-02-25 DIAGNOSIS — F3289 Other specified depressive episodes: Secondary | ICD-10-CM | POA: Diagnosis present

## 2013-02-25 DIAGNOSIS — Z9010 Acquired absence of unspecified breast and nipple: Secondary | ICD-10-CM

## 2013-02-25 DIAGNOSIS — E039 Hypothyroidism, unspecified: Secondary | ICD-10-CM | POA: Diagnosis present

## 2013-02-25 DIAGNOSIS — R7989 Other specified abnormal findings of blood chemistry: Secondary | ICD-10-CM

## 2013-02-25 DIAGNOSIS — Z9221 Personal history of antineoplastic chemotherapy: Secondary | ICD-10-CM

## 2013-02-25 DIAGNOSIS — I10 Essential (primary) hypertension: Secondary | ICD-10-CM | POA: Diagnosis present

## 2013-02-25 DIAGNOSIS — I2699 Other pulmonary embolism without acute cor pulmonale: Principal | ICD-10-CM

## 2013-02-25 DIAGNOSIS — C50919 Malignant neoplasm of unspecified site of unspecified female breast: Secondary | ICD-10-CM | POA: Diagnosis present

## 2013-02-25 DIAGNOSIS — I739 Peripheral vascular disease, unspecified: Secondary | ICD-10-CM | POA: Diagnosis present

## 2013-02-25 DIAGNOSIS — J189 Pneumonia, unspecified organism: Secondary | ICD-10-CM | POA: Diagnosis present

## 2013-02-25 DIAGNOSIS — R0681 Apnea, not elsewhere classified: Secondary | ICD-10-CM | POA: Diagnosis present

## 2013-02-25 DIAGNOSIS — Z79899 Other long term (current) drug therapy: Secondary | ICD-10-CM

## 2013-02-25 DIAGNOSIS — D6859 Other primary thrombophilia: Secondary | ICD-10-CM | POA: Diagnosis present

## 2013-02-25 DIAGNOSIS — C3490 Malignant neoplasm of unspecified part of unspecified bronchus or lung: Secondary | ICD-10-CM | POA: Diagnosis present

## 2013-02-25 DIAGNOSIS — I248 Other forms of acute ischemic heart disease: Secondary | ICD-10-CM | POA: Diagnosis present

## 2013-02-25 DIAGNOSIS — Z853 Personal history of malignant neoplasm of breast: Secondary | ICD-10-CM

## 2013-02-25 DIAGNOSIS — G2581 Restless legs syndrome: Secondary | ICD-10-CM | POA: Diagnosis present

## 2013-02-25 DIAGNOSIS — J9620 Acute and chronic respiratory failure, unspecified whether with hypoxia or hypercapnia: Secondary | ICD-10-CM | POA: Diagnosis present

## 2013-02-25 DIAGNOSIS — Z66 Do not resuscitate: Secondary | ICD-10-CM | POA: Diagnosis present

## 2013-02-25 DIAGNOSIS — R778 Other specified abnormalities of plasma proteins: Secondary | ICD-10-CM

## 2013-02-25 DIAGNOSIS — Z923 Personal history of irradiation: Secondary | ICD-10-CM

## 2013-02-25 DIAGNOSIS — Z87891 Personal history of nicotine dependence: Secondary | ICD-10-CM

## 2013-02-25 LAB — COMPREHENSIVE METABOLIC PANEL
ALT: 12 U/L (ref 0–35)
AST: 15 U/L (ref 0–37)
CO2: 24 mEq/L (ref 19–32)
Chloride: 97 mEq/L (ref 96–112)
GFR calc non Af Amer: 66 mL/min — ABNORMAL LOW (ref >90)
Sodium: 131 mEq/L — ABNORMAL LOW (ref 135–145)
Total Bilirubin: 0.6 mg/dL (ref 0.3–1.2)

## 2013-02-25 LAB — CBC
Platelets: 99 10*3/uL — ABNORMAL LOW (ref 150–400)
RBC: 3.77 MIL/uL — ABNORMAL LOW (ref 3.87–5.11)
WBC: 8.4 10*3/uL (ref 4.0–10.5)

## 2013-02-25 LAB — POCT I-STAT TROPONIN I: Troponin i, poc: 0.32 ng/mL (ref 0.00–0.08)

## 2013-02-25 LAB — PROTIME-INR
INR: 1.1 (ref 0.00–1.49)
Prothrombin Time: 14 seconds (ref 11.6–15.2)

## 2013-02-25 LAB — TROPONIN I
Troponin I: 0.47 ng/mL (ref <0.30)
Troponin I: 0.35 ng/mL (ref <0.30)

## 2013-02-25 LAB — APTT: aPTT: 27 seconds (ref 24–37)

## 2013-02-25 MED ORDER — MORPHINE SULFATE 2 MG/ML IJ SOLN: 2.0000 mg | INTRAMUSCULAR | Status: DC | PRN

## 2013-02-25 MED ORDER — SODIUM CHLORIDE 0.9 % IV BOLUS (SEPSIS)
500.0000 mL | Freq: Once | INTRAVENOUS | Status: AC
  Administered 2013-02-25: 500 mL via INTRAVENOUS

## 2013-02-25 MED ORDER — CITALOPRAM HYDROBROMIDE 40 MG PO TABS
40.0000 mg | ORAL_TABLET | Freq: Every morning | ORAL | Status: DC
  Filled 2013-02-25 (×2): qty 1

## 2013-02-25 MED ORDER — OXYCODONE HCL 10 MG PO TB12: 10.0000 mg | ORAL_TABLET | Freq: Two times a day (BID) | ORAL | Status: DC

## 2013-02-25 MED ORDER — MORPHINE SULFATE 4 MG/ML IJ SOLN
4.0000 mg | INTRAMUSCULAR | Status: DC | PRN
  Administered 2013-02-25: 2 mg via INTRAVENOUS
  Administered 2013-02-25 – 2013-02-26 (×3): 4 mg via INTRAVENOUS
  Filled 2013-02-25 (×3): qty 1

## 2013-02-25 MED ORDER — PRAMIPEXOLE DIHYDROCHLORIDE 0.125 MG PO TABS
0.1250 mg | ORAL_TABLET | Freq: Three times a day (TID) | ORAL | Status: DC
  Administered 2013-02-25 (×2): 0.125 mg via ORAL
  Filled 2013-02-25 (×8): qty 1

## 2013-02-25 MED ORDER — ALPRAZOLAM 0.5 MG PO TABS
0.5000 mg | ORAL_TABLET | Freq: Two times a day (BID) | ORAL | Status: DC | PRN
  Administered 2013-02-25: 0.5 mg via ORAL
  Filled 2013-02-25: qty 1

## 2013-02-25 MED ORDER — GABAPENTIN 300 MG PO CAPS
300.0000 mg | ORAL_CAPSULE | Freq: Three times a day (TID) | ORAL | Status: DC
  Administered 2013-02-25 (×2): 300 mg via ORAL
  Filled 2013-02-25 (×8): qty 1

## 2013-02-25 MED ORDER — METOPROLOL TARTRATE 25 MG PO TABS: 12.5000 mg | ORAL_TABLET | Freq: Two times a day (BID) | ORAL | Status: AC

## 2013-02-25 MED ORDER — ONDANSETRON HCL 4 MG/2ML IJ SOLN
4.0000 mg | Freq: Once | INTRAMUSCULAR | Status: AC
  Administered 2013-02-25: 4 mg via INTRAVENOUS
  Filled 2013-02-25: qty 2

## 2013-02-25 MED ORDER — HEPARIN (PORCINE) IN NACL 100-0.45 UNIT/ML-% IJ SOLN
1200.0000 [IU]/h | INTRAMUSCULAR | Status: DC
  Administered 2013-02-25 – 2013-02-27 (×3): 1200 [IU]/h via INTRAVENOUS
  Filled 2013-02-25 (×6): qty 250

## 2013-02-25 MED ORDER — HEPARIN BOLUS VIA INFUSION
2000.0000 [IU] | Freq: Once | INTRAVENOUS | Status: AC
  Administered 2013-02-25: 2000 [IU] via INTRAVENOUS

## 2013-02-25 MED ORDER — SODIUM CHLORIDE 0.9 % IV SOLN
INTRAVENOUS | Status: DC
  Administered 2013-02-25: 21:00:00 via INTRAVENOUS

## 2013-02-25 MED ORDER — IOHEXOL 350 MG/ML SOLN
100.0000 mL | Freq: Once | INTRAVENOUS | Status: AC | PRN
  Administered 2013-02-25: 100 mL via INTRAVENOUS

## 2013-02-25 MED ORDER — ONDANSETRON HCL 4 MG/2ML IJ SOLN: 4.0000 mg | Freq: Four times a day (QID) | INTRAMUSCULAR | Status: DC | PRN

## 2013-02-25 MED ORDER — ACETAMINOPHEN 650 MG RE SUPP: 650.0000 mg | Freq: Four times a day (QID) | RECTAL | Status: DC | PRN

## 2013-02-25 MED ORDER — OXYCODONE HCL 5 MG PO TABS
5.0000 mg | ORAL_TABLET | ORAL | Status: DC | PRN
  Administered 2013-02-25: 10 mg via ORAL
  Filled 2013-02-25: qty 2

## 2013-02-25 MED ORDER — OXYCODONE HCL ER 10 MG PO T12A
10.0000 mg | EXTENDED_RELEASE_TABLET | Freq: Two times a day (BID) | ORAL | Status: DC
  Administered 2013-02-25: 10 mg via ORAL
  Filled 2013-02-25: qty 1

## 2013-02-25 MED ORDER — IPRATROPIUM BROMIDE 0.02 % IN SOLN
0.5000 mg | Freq: Once | RESPIRATORY_TRACT | Status: DC
  Filled 2013-02-25: qty 2.5

## 2013-02-25 MED ORDER — ALBUTEROL SULFATE (5 MG/ML) 0.5% IN NEBU
5.0000 mg | INHALATION_SOLUTION | Freq: Once | RESPIRATORY_TRACT | Status: DC
  Filled 2013-02-25: qty 1

## 2013-02-25 MED ORDER — ALPRAZOLAM 0.5 MG PO TABS: 0.5000 mg | ORAL_TABLET | Freq: Every evening | ORAL | Status: AC | PRN

## 2013-02-25 MED ORDER — LEVOFLOXACIN 750 MG PO TABS
750.0000 mg | ORAL_TABLET | Freq: Every day | ORAL | Status: DC
  Administered 2013-02-25: 750 mg via ORAL
  Filled 2013-02-25 (×2): qty 1

## 2013-02-25 MED ORDER — MORPHINE SULFATE 4 MG/ML IJ SOLN
INTRAMUSCULAR | Status: AC
  Administered 2013-02-25: 2 mg via INTRAVENOUS
  Filled 2013-02-25: qty 1

## 2013-02-25 MED ORDER — SODIUM CHLORIDE 0.9 % IJ SOLN: 3.0000 mL | Freq: Two times a day (BID) | INTRAMUSCULAR | Status: DC

## 2013-02-25 MED ORDER — ONDANSETRON HCL 4 MG PO TABS: 4.0000 mg | ORAL_TABLET | Freq: Four times a day (QID) | ORAL | Status: DC | PRN

## 2013-02-25 MED ORDER — TRAZODONE 25 MG HALF TABLET
25.0000 mg | ORAL_TABLET | Freq: Every day | ORAL | Status: DC
  Administered 2013-02-25: 25 mg via ORAL
  Filled 2013-02-25 (×2): qty 1

## 2013-02-25 MED ORDER — MIRTAZAPINE 15 MG PO TABS
15.0000 mg | ORAL_TABLET | Freq: Every day | ORAL | Status: DC
  Administered 2013-02-25: 15 mg via ORAL
  Filled 2013-02-25 (×3): qty 1

## 2013-02-25 MED ORDER — LEVOTHYROXINE SODIUM 112 MCG PO TABS
112.0000 ug | ORAL_TABLET | Freq: Every day | ORAL | Status: DC
  Administered 2013-02-26: 112 ug via ORAL
  Filled 2013-02-25 (×3): qty 1

## 2013-02-25 MED ORDER — ASPIRIN 81 MG PO CHEW
324.0000 mg | CHEWABLE_TABLET | Freq: Once | ORAL | Status: AC
  Administered 2013-02-25: 324 mg via ORAL
  Filled 2013-02-25: qty 4

## 2013-02-25 MED ORDER — MORPHINE SULFATE 4 MG/ML IJ SOLN
6.0000 mg | Freq: Once | INTRAMUSCULAR | Status: AC
  Administered 2013-02-25: 6 mg via INTRAVENOUS
  Filled 2013-02-25: qty 2

## 2013-02-25 MED ORDER — CITALOPRAM HYDROBROMIDE 40 MG PO TABS: 40.0000 mg | ORAL_TABLET | Freq: Every morning | ORAL | Status: AC

## 2013-02-25 MED ORDER — LEVOTHYROXINE SODIUM 112 MCG PO TABS: 112.0000 ug | ORAL_TABLET | Freq: Every day | ORAL | Status: AC

## 2013-02-25 MED ORDER — PROCHLORPERAZINE MALEATE 10 MG PO TABS
10.0000 mg | ORAL_TABLET | Freq: Four times a day (QID) | ORAL | Status: DC | PRN
  Filled 2013-02-25: qty 1

## 2013-02-25 MED ORDER — ACETAMINOPHEN 325 MG PO TABS: 650.0000 mg | ORAL_TABLET | Freq: Four times a day (QID) | ORAL | Status: DC | PRN

## 2013-02-25 NOTE — H&P (Signed)
[facility] History and Physical  Katelyn [name] ZOX:[id number] DOB: 03-Jul-1945 DOA: 03/23/13  Referring physician: Dr. Juleen China PCP: [name], MD  Specialists: Dr. Myna Hidalgo (oncology)  Chief Complaint: SOB, chest pain  HPI: Katelyn [name] is a 67 y.o. female with significant past medical history for lung cancer (actively receiving chemo/radiation therapy), hx of breast cancer, HTN, depression, hypothyroidism, chronic resp failure and recent admission secondary to hemoptysis and HCAP; came to the hospital with worsening SOB and pleuritic chest pain on her right side. Found to be tachycardic in the ED and a CT angio was performed demonstrating positive for a large volume of pulmonary embolism throughout the right lung, including both occlusive and nonocclusive clot. There is evidence to suggest elevated pulmonary artery pressures and potentially elevated right-sided heart pressures. In addition, there is evidence of hemorrhage throughout the right lower lobe, presumably related to pulmonary infarction. [facility] called to admit patient for further evaluation and treatment. Patient is DNR/DNI and would like to avoid invasive therapy. She denies significant hemoptysis, fever, chills, cough, abd pain, melena or any other acute complaints.  Review of Systems:  Negative except as otherwise mentioned on HPI.  Past Medical History  Diagnosis Date  . 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 Pulmonary nodule, left 08/26/2011  . Hypertension   . Restless leg syndrome   . Peripheral vascular disease   . Carotid artery stenosis     bilateral  . Glomus tumor      right  . Shortness of breath     exertional  . Hypothyroidism   . Cancer     breast, bilateral s/p mast and chemo  . Shoulder pain, left     due to vascular dz  . Left hip pain     when ambulatory  . Episodic low back pain   . Depression     stress related  . Hard of hearing   . Hx of radiation therapy 06/04/95- 07/21/95    left chest  wall, l axilla, l supraclavicular area  . Breast cancer 1996    left  . Breast cancer 1973    right, mastectomy  . Lung cancer 08/2011, 12/2012    left upper lobe, recurrence 2014   Past Surgical History  Procedure Laterality Date  . Breast surgery  [id number]    bilateral mastectomy  . Portacath placement  1996    removed after chemo  . Vascular stent  01/07/2004    left subclavian  . Mastectomy      bilateral  . Tubal ligation    . Tonsillectomy      age 73  . Vascular surgery    . Lung cancer surgery Left 09/17/11    wedge resection, LUL  . Lung cancer surgery Left 09/21/11    lung resection   Social History:  reports that she quit smoking about 19 months ago. Her smoking use included Cigarettes. She has a 40 pack-year smoking history. She quit smokeless tobacco use about 19 months ago. She reports that she does not drink alcohol or use illicit drugs.  No Known Allergies  Family History  Problem Relation Age of Onset  . Anesthesia problems Neg Hx   . Heart attack Mother   . Cancer Brother     lung  . Cancer Sister     Hodgkin's, breast to bone  . Alzheimer's disease Sister     Prior to Admission medications   Medication Sig Start Date End Date Taking? Authorizing Provider  calcium  carbonate (OS-CAL) 600 MG TABS Take 600 mg by mouth every evening.    Yes Historical Provider, MD  Cholecalciferol (VITAMIN D) 2000 UNITS CAPS Take 2,000 Units by mouth every morning.    Yes Historical Provider, MD  citalopram (CELEXA) 40 MG tablet Take 1 tablet (40 mg total) by mouth every morning. 02/07/2013  Yes Mary-Margaret Daphine Deutscher, FNP  gabapentin (NEURONTIN) 300 MG capsule Take 1 capsule (300 mg total) by mouth 3 (three) times daily. 02/09/13  Yes Josph Macho, MD  ibuprofen (ADVIL,MOTRIN) 200 MG tablet Take 400 mg by mouth every 6 (six) hours as needed for pain.   Yes Historical Provider, MD  levofloxacin (LEVAQUIN) 750 MG tablet Take 1 tablet (750 mg total) by mouth daily. 02/23/13  Yes  [name], MD  levothyroxine (SYNTHROID, LEVOTHROID) 112 MCG tablet Take 1 tablet (112 mcg total) by mouth daily before breakfast. 02/24/2013  Yes Mary-Margaret Daphine Deutscher, FNP  lidocaine-prilocaine (EMLA) cream Apply topically as needed. Apply quarter sized amount onto site of portacath 1-1 1/2 hours prior to using portacath. 01/22/13  Yes Josph Macho, MD  LORazepam (ATIVAN) 0.5 MG tablet Take 0.5 mg by mouth every morning.    Yes Historical Provider, MD  metoprolol tartrate (LOPRESSOR) 25 MG tablet Take 0.5 tablets (12.5 mg total) by mouth 2 (two) times daily. 01/31/2013  Yes Mary-Margaret Daphine Deutscher, FNP  mirtazapine (REMERON) 15 MG tablet Take 1 tablet (15 mg total) by mouth at bedtime. 02/11/13  Yes Josph Macho, MD  oxyCODONE (OXY IR/ROXICODONE) 5 MG immediate release tablet Take 1-2 tablets (5-10 mg total) by mouth every 4 (four) hours as needed for pain. 02/18/13  Yes Josph Macho, MD  oxyCODONE (OXYCONTIN) 10 MG 12 hr tablet Take 1 tablet (10 mg total) by mouth every 12 (twelve) hours. 01/28/13  Yes Josph Macho, MD  pramipexole (MIRAPEX) 0.125 MG tablet Take 1 tablet (0.125 mg total) by mouth 3 (three) times daily. 02/11/13  Yes Ernestina Penna, MD  PRESCRIPTION MEDICATION Aloxi, Emend and Platinol infusion q7d on Thursdays   Yes Historical Provider, MD  prochlorperazine (COMPAZINE) 10 MG tablet Take 10 mg by mouth every 6 (six) hours as needed (for nausea).   Yes Historical Provider, MD  traZODone (DESYREL) 50 MG tablet Take 25 mg by mouth at bedtime. 12/29/12  Yes Mary-Margaret Daphine Deutscher, FNP  ALPRAZolam Prudy Feeler) 0.5 MG tablet Take 1 tablet (0.5 mg total) by mouth at bedtime as needed for sleep. 02/04/2013   Mary-Margaret Daphine Deutscher, FNP   Physical Exam: Filed Vitals:   02/11/2013 1255  BP: 110/54  Pulse: 116  Temp: 97.7 F (36.5 C)  Resp:      General:  Mild distress secondary to pain on her chest and SOB; no fever, no hemoptysis   Eyes: PERRL, no icterus, EOMI  ENT: no erythema or exudates  inside her mouth, no thrush, no ears or nostrils drainage  Neck: supple, no thyromegaly  Cardiovascular: tachycardic, no rubs or gallops  Respiratory: no wheezing, no rales; decreased BS at bases  Abdomen: soft, NT, ND, positive BS  Skin: no rash or petechiae  Musculoskeletal: no joint swelling or erythema  Psychiatric: no SI or hallucinations  Neurologic: CN intact, no motor or sensory deficit appreciated; overall physical deconditioning and generalized weakness with ongoing medical problems.  Labs on Admission:  Basic Metabolic Panel:  Recent Labs Lab 02/20/13 1200 02/22/13 0425 02/06/2013 0948  NA 135  --  131*  K 4.7  --  4.0  CL 103  --  97  CO2 25  --  24  GLUCOSE 120*  --  117*  BUN 21  --  18  CREATININE 0.84 0.76 0.88  CALCIUM 8.6  --  8.7   Liver Function Tests:  Recent Labs Lab 03-24-2013 0948  AST 15  ALT 12  ALKPHOS 59  BILITOT 0.6  PROT 6.2  ALBUMIN 2.5*   CBC:  Recent Labs Lab 02/20/13 1200 02/22/13 0425 March 24, 2013 0948  WBC 12.2* 7.0 8.4  NEUTROABS 10.6*  --   --   HGB 11.3* 10.6* 12.0  HCT 34.3* 32.9* 34.9*  MCV 94.0 95.4 92.6  PLT 150 104* 99*   Radiological Exams on Admission: Dg Chest 2 View (if Patient Has Fever And/or Copd)  2013-03-24   *RADIOLOGY REPORT*  Clinical Data: Shortness of breath.  Lung cancer.  CHEST - 2 VIEW  Comparison: 02/21/2013.  Findings: The power port is stable.  Stable surgical changes involving the left lung with volume loss, scarring and probable radiation change.  No pleural effusion.  IMPRESSION: Stable postoperative and probable post radiation changes involving the left hemithorax.   Original Report Authenticated By: Rudie Meyer, M.D.   Ct Angio Chest W/cm &/or Wo Cm  Mar 24, 2013   *RADIOLOGY REPORT*  Clinical Data: Evaluate for pulmonary embolism.  CT ANGIOGRAPHY CHEST  Technique:  Multidetector CT imaging of the chest using the standard protocol during bolus administration of intravenous contrast.  Multiplanar reconstructed images including MIPs were obtained and reviewed to evaluate the vascular anatomy.  Contrast: OMNIPAQUE IOHEXOL 350 MG/ML SOLN  Comparison: Chest c t 02/20/2013.  Findings:  Mediastinum: There are large filling defects in the distal right pulmonary artery extending into lobar, segmental and subsegmental side branches throughout the right lung, compatible with a large volume of pulmonary embolism. Although several of these defects appear been nonocclusive, there are occlusive defects in the right upper lobe, right middle lobe and posterior aspect of the right lower lobe.  No filling defects are noted within the left pulmonary artery, lobar, segmental or subsegmental sized branches to the left lung.  The pulmonic trunk appears mildly dilated (3.4 cm in diameter), which may suggest elevated pulmonary artery pressures. Additionally, the right heart appears slightly dilated, with mild right-to-left bowing of the interatrial septum, which could suggest some elevated right-sided heart pressures. There is no significant pericardial fluid, thickening or pericardial calcification. There is atherosclerosis of the thoracic aorta, the great vessels of the mediastinum and the coronary arteries, including calcified atherosclerotic plaque in the left main, left anterior descending, left circumflex and right coronary arteries. Mild calcifications of the mitral annulus and the aortic valve. No pathologically enlarged mediastinal or hilar lymph nodes. Esophagus is unremarkable in appearance.     A stent is incidentally noted in the proximal left subclavian artery. Right internal jugular single lumen power Port-A- Cath with tip terminating in the superior aspect of the right atrium.  Lungs/Pleura: There is a large area of new air space consolidation and ground glass attenuation throughout much of the right lower lobe, which likely represents hemorrhage from pulmonary infarction. There continues to be  some air space consolidation throughout the left lower lobe, although this is slightly improved compared to the prior study, which likely reflect resolving pneumonia.  Status post left upper lobectomy.  Calcified granuloma in the periphery of the right upper lobe is unchanged.  Moderate centrilobular emphysema. Mild diffuse bronchial wall thickening.  Postoperative scarring in the apex of the left hemithorax is unchanged.  Upper Abdomen:  Unremarkable.  Musculoskeletal: Status post bilateral modified radical mastectomy with left axillary lymph node dissection. As with the prior examination and there is a lytic lesion with soft tissue component involving the posterior aspect of the left fifth rib, as well as the left T5 transverse process.  This appears similar to the prior study.  Multiple old healed bilateral rib fractures are again noted.  IMPRESSION: 1.  Study is positive for a large volume of pulmonary embolism throughout the right lung, including both occlusive and nonocclusive clot.  There is evidence to suggest elevated pulmonary artery pressures and potentially elevated right-sided heart pressures.  In addition, there is evidence of hemorrhage throughout the right lower lobe, presumably related to pulmonary infarction. 2.  Slight improvement in airspace consolidation in the left lower lobe, suggesting positive response to therapy for presumed pneumonia. 3.  Large lytic lesion involving the posterior aspect of the left fifth rib and the left T5 transverse process appears similar to the prior study, and likely represents a metastatic lesion. 4. Atherosclerosis, including left main and three-vessel coronary artery disease. 5.  Additional findings, as above, similar to prior studies.  Critical Value/emergent results were called by telephone at the time of interpretation on 02/24/2013 at 10:58 a.m. to Dr. Juleen China, who verbally acknowledged these results.   Original Report Authenticated By: Trudie Reed, M.D.    EKG: Rhythm: sinus tachycardia with PACs  Vent. rate 118 BPM  PR interval 108 ms  QRS duration 98 ms  QT/QTc 352/493 ms  ST segments: NS ST changes  Comparison: similar to previous EKG from 7/8 aside from increased rate  Assessment/Plan 1-Pulmonary embolism and infarction: patient with hypercoagulable state given hx of malignancy -will start heparin drip -admit to telemetry -continue oxygen supplementation (will increase liters of chronic oxygen to 4 L) -supportive care -after discussing with oncology service decision is for lovenox or arixtra at discharge for long term treatment. -close follow up for hemoptysis or bleeding given findings of hemorrhage on Lung CT  2-Breast cancer, s/p Mstectomy and XRT 1996 and Lung cancer: will follow recommendations per oncology service  3-Hypertension: stable. Will continue metoprolol  4-Hypothyroidism: continue synthroid and check TSH  5-Chronic respiratory failure: will continue oxygen supplementation. Patient resp failure due to lobectomy and lung cancer. No wheezing   6-Hospital acquired PNA: continue levaquin. Today CXR demonstrated improvement   7-Depression:continue home regimen  8-mild elevation on her troponin:will cycle cardiac enzymes and check 2-D echo  9-Physical deconditioning: will ask PT to evaluate and treat once patient more stable  Oncology (Dr. Myna Hidalgo informed about hospital admission)  Code Status: DNR Family Communication: daughter at bedside Disposition Plan: telemetry, inpatient, LOS > 2 midnights  Time spent: 60 minutes  Jalynne Persico [facility] Pager [phone]  If 7PM-7AM, please contact night-coverage [email or web address] Password [facility] 02/06/2013, 3:51 PM

## 2013-02-25 NOTE — Telephone Encounter (Signed)
Last seen 07/15/12  MMM   Lat thyroid labs 01/06/13    Patient requesting 90 day supply from pharmacy  Alprazolam not on EPIC list  If approved route to nurse to phone in

## 2013-02-25 NOTE — ED Notes (Signed)
Bed: ZO10 Expected date:  Expected time:  Means of arrival:  Comments: ems- SOB, cancer pt

## 2013-02-25 NOTE — Progress Notes (Signed)
Pt initial troponin=0.47, Dr. Gwenlyn Perking aware of result. Pt already on Heparin drip. Will cont to monitor.

## 2013-02-25 NOTE — Progress Notes (Signed)
ANTICOAGULATION CONSULT NOTE - Initial Consult  Pharmacy Consult for Heparin Indication: pulmonary embolus  No Known Allergies  Patient Measurements: Height: 5\' 6"  (167.6 cm) Weight: 172 lb 9.6 oz (78.291 kg) IBW/kg (Calculated) : 59.3 Heparin Dosing Weight: 78.2 kg  Vital Signs: Temp: 97.7 F (36.5 C) (08/28 1255) Temp src: Oral (08/28 1255) BP: 110/54 mmHg (08/28 1255) Pulse Rate: 116 (08/28 1255)  Labs:  Recent Labs  03-19-2013 0948 2013/03/19 1132 March 19, 2013 1804  HGB 12.0  --   --   HCT 34.9*  --   --   PLT 99*  --   --   APTT  --  27  --   LABPROT  --  14.0  --   INR  --  1.10  --   HEPARINUNFRC  --   --  0.58  CREATININE 0.88  --   --   TROPONINI  --   --  0.47*   Estimated Creatinine Clearance: 64.6 ml/min (by C-G formula based on Cr of 0.88).   Assessment: 66 yoF with increased shortness of breath on ambulation at home today. Chest ZO:XWRUE R lobe PE, begin Heparin infusion.  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  Recent admit 8/23-8/26 for hemoptysis, PNA. CT comparison L lung: improvement of PNA.  Anticipate transition to Lovenox with active chemotherapy orders. Appropriate doses would be 80mg  q12 or 120mg  q24.   Baseline INR nml at 1.1  1st heparin level in therapeutic range at 0.58  Hgb nml at 12.0, plts low at 99, renal function stable  No bleeding or infusion line issues documented  Initial troponins positive  Goal of Therapy:  Heparin level 0.3-0.7 units/ml Monitor platelets by anticoagulation protocol: Yes   Plan:   Continue heparin infusion at 1200 units/hr  Check Heparin level at 6 hr and then daily  Daily CBC while on Heparin  Monitor for s/sx bleeding, follow-up positive troponins  Thank you for the consult.  Tomi Bamberger, PharmD Clinical Pharmacist Pager: [phone] Pharmacy: [phone] Mar 19, 2013 7:59 PM

## 2013-02-25 NOTE — Progress Notes (Signed)
  Echocardiogram 2D Echocardiogram has been performed.  Katelyn [name] 17-Mar-2013, 4:23 PM

## 2013-02-25 NOTE — ED Provider Notes (Signed)
CSN: [id number]     Arrival date & time 02/24/2013  0935 History   First MD Initiated Contact with Patient 02/12/2013 1001     Chief Complaint  Patient presents with  . Shortness of Breath   (Consider location/radiation/quality/duration/timing/severity/associated sxs/prior Treatment) HPI  67 year old female with shortness of breath. Patient has a past history of non-small cell lung cancer with metastasis. She was recently admitted to the hospital and treated for a left-sided pneumonia. She had been feeling better until this morning when she acutely became more short of breath than her baseline. She feels this way at rest and is worsened with exertion. Sharp right-sided chest pain. These symptoms feel different than the symptoms she was having during her recent admission. No fevers or chills. Small amount of both bright red and dark appearing hemoptysis. She has been on Levaquin since her discharge and she reports compliance. On 2L home oxygen.    Past Medical History  Diagnosis Date  . 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 Pulmonary nodule, left 08/26/2011  . Hypertension   . Restless leg syndrome   . Peripheral vascular disease   . Carotid artery stenosis     bilateral  . Glomus tumor      right  . Shortness of breath     exertional  . Hypothyroidism   . Cancer     breast, bilateral s/p mast and chemo  . Shoulder pain, left     due to vascular dz  . Left hip pain     when ambulatory  . Episodic low back pain   . Depression     stress related  . Hard of hearing   . Hx of radiation therapy 06/04/95- 07/21/95    left chest wall, l axilla, l supraclavicular area  . Breast cancer 1996    left  . Breast cancer 1973    right, mastectomy  . Lung cancer 08/2011, 12/2012    left upper lobe, recurrence 2014   Past Surgical History  Procedure Laterality Date  . Breast surgery  4098,1191    bilateral mastectomy  . Portacath placement  1996    removed after chemo  . Vascular stent   01/07/2004    left subclavian  . Mastectomy      bilateral  . Tubal ligation    . Tonsillectomy      age 14  . Vascular surgery    . Lung cancer surgery Left 09/17/11    wedge resection, LUL  . Lung cancer surgery Left 09/21/11    lung resection   Family History  Problem Relation Age of Onset  . Anesthesia problems Neg Hx   . Heart attack Mother   . Cancer Brother     lung  . Cancer Sister     Hodgkin's, breast to bone  . Alzheimer's disease Sister    History  Substance Use Topics  . Smoking status: Former Smoker -- 1.00 packs/day for 40 years    Types: Cigarettes    Quit date: 07/09/2011  . Smokeless tobacco: Former Neurosurgeon    Quit date: 07/09/2011     Comment: electronic cigarette daily, 01/25/13 no smoking  . Alcohol Use: No   OB History   Grav Para Term Preterm Abortions TAB SAB Ect Mult Living                 Review of Systems  All systems reviewed and negative, other than as noted in HPI.   Allergies  Review of patient's  allergies indicates no known allergies.  Home Medications   Current Outpatient Rx  Name  Route  Sig  Dispense  Refill  . calcium carbonate (OS-CAL) 600 MG TABS   Oral   Take 600 mg by mouth every evening.          . Cholecalciferol (VITAMIN D) 2000 UNITS CAPS   Oral   Take 2,000 Units by mouth every morning.          . citalopram (CELEXA) 40 MG tablet   Oral   Take 1 tablet (40 mg total) by mouth every morning.   90 tablet   0   . gabapentin (NEURONTIN) 300 MG capsule   Oral   Take 1 capsule (300 mg total) by mouth 3 (three) times daily.   270 capsule   2     Ok to provide a 90 day supply   . ibuprofen (ADVIL,MOTRIN) 200 MG tablet   Oral   Take 400 mg by mouth every 6 (six) hours as needed for pain.         Marland Kitchen levofloxacin (LEVAQUIN) 750 MG tablet   Oral   Take 1 tablet (750 mg total) by mouth daily.   4 tablet   0   . levothyroxine (SYNTHROID, LEVOTHROID) 112 MCG tablet   Oral   Take 1 tablet (112 mcg total) by  mouth daily before breakfast.   90 tablet   3   . lidocaine-prilocaine (EMLA) cream   Topical   Apply topically as needed. Apply quarter sized amount onto site of portacath 1-1 1/2 hours prior to using portacath.   30 g   6   . LORazepam (ATIVAN) 0.5 MG tablet   Oral   Take 0.5 mg by mouth every morning.          . metoprolol tartrate (LOPRESSOR) 25 MG tablet   Oral   Take 0.5 tablets (12.5 mg total) by mouth 2 (two) times daily.   180 tablet   0   . mirtazapine (REMERON) 15 MG tablet   Oral   Take 1 tablet (15 mg total) by mouth at bedtime.   30 tablet   4   . oxyCODONE (OXY IR/ROXICODONE) 5 MG immediate release tablet   Oral   Take 1-2 tablets (5-10 mg total) by mouth every 4 (four) hours as needed for pain.   60 tablet   0   . oxyCODONE (OXYCONTIN) 10 MG 12 hr tablet   Oral   Take 1 tablet (10 mg total) by mouth every 12 (twelve) hours.   60 tablet   0   . pramipexole (MIRAPEX) 0.125 MG tablet   Oral   Take 1 tablet (0.125 mg total) by mouth 3 (three) times daily.   270 tablet   1   . PRESCRIPTION MEDICATION      Aloxi, Emend and Platinol infusion q7d on Thursdays         . prochlorperazine (COMPAZINE) 10 MG tablet   Oral   Take 10 mg by mouth every 6 (six) hours as needed (for nausea).         . traZODone (DESYREL) 50 MG tablet   Oral   Take 25 mg by mouth at bedtime.         . ALPRAZolam (XANAX) 0.5 MG tablet   Oral   Take 1 tablet (0.5 mg total) by mouth at bedtime as needed for sleep.   60 tablet   1    BP 131/56  Pulse  125  Temp(Src) 97.9 F (36.6 C) (Oral)  Resp 36  SpO2 3% Physical Exam  Nursing note and vitals reviewed. Constitutional: She is oriented to person, place, and time. She appears distressed.  HENT:  Head: Normocephalic and atraumatic.  Eyes: Conjunctivae are normal. Pupils are equal, round, and reactive to light. Right eye exhibits no discharge. Left eye exhibits no discharge.  Neck: Neck supple.   Cardiovascular: Regular rhythm and normal heart sounds.  Exam reveals no gallop and no friction rub.   No murmur heard. tachycardic  Pulmonary/Chest: She is in respiratory distress. She has rales.  Port R chest. Tachypnea. R sided rhonchi.   Abdominal: Soft. She exhibits no distension. There is no tenderness.  Musculoskeletal: She exhibits no edema and no tenderness.  No calf tenderness. No swelling.   Neurological: She is alert and oriented to person, place, and time.  Skin: Skin is warm and dry. She is not diaphoretic.  Psychiatric: She has a normal mood and affect. Her behavior is normal. Thought content normal.    ED Course  Procedures (including critical care time)  CRITICAL CARE Performed by: Raeford Razor  Total critical care time: 40 minutes  Critical care time was exclusive of separately billable procedures and treating other patients. Critical care was necessary to treat or prevent imminent or life-threatening deterioration. Critical care was time spent personally by me on the following activities: development of treatment plan with patient and/or surrogate as well as nursing, discussions with consultants, evaluation of patient's response to treatment, examination of patient, obtaining history from patient or surrogate, ordering and performing treatments and interventions, ordering and review of laboratory studies, ordering and review of radiographic studies, pulse oximetry and re-evaluation of patient's condition.    Labs Review Labs Reviewed  CBC - Abnormal; Notable for the following:    RBC 3.77 (*)    HCT 34.9 (*)    Platelets 99 (*)    All other components within normal limits  COMPREHENSIVE METABOLIC PANEL - Abnormal; Notable for the following:    Sodium 131 (*)    Glucose, Bld 117 (*)    Albumin 2.5 (*)    GFR calc non Af Amer 66 (*)    GFR calc Af Amer 76 (*)    All other components within normal limits  POCT I-STAT TROPONIN I - Abnormal; Notable for the  following:    Troponin i, poc 0.32 (*)    All other components within normal limits  PROTIME-INR  APTT   Imaging Review  EKG:  Rhythm: sinus tachycardia with PACs Vent. rate 118 BPM PR interval 108 ms QRS duration 98 ms QT/QTc 352/493 ms ST segments: NS ST changes Comparison: similar to previous EKG from 7/8 aside from increased rate   Dg Chest 2 View (if Patient Has Fever And/or Copd)  2013-03-02   *RADIOLOGY REPORT*  Clinical Data: Shortness of breath.  Lung cancer.  CHEST - 2 VIEW  Comparison: 02/21/2013.  Findings: The power port is stable.  Stable surgical changes involving the left lung with volume loss, scarring and probable radiation change.  No pleural effusion.  IMPRESSION: Stable postoperative and probable post radiation changes involving the left hemithorax.   Original Report Authenticated By: Rudie Meyer, M.D.   Ct Angio Chest W/cm &/or Wo Cm  2013/03/02   *RADIOLOGY REPORT*  Clinical Data: Evaluate for pulmonary embolism.  CT ANGIOGRAPHY CHEST  Technique:  Multidetector CT imaging of the chest using the standard protocol during bolus administration of intravenous contrast. Multiplanar  reconstructed images including MIPs were obtained and reviewed to evaluate the vascular anatomy.  Contrast: OMNIPAQUE IOHEXOL 350 MG/ML SOLN  Comparison: Chest c t 02/20/2013.  Findings:  Mediastinum: There are large filling defects in the distal right pulmonary artery extending into lobar, segmental and subsegmental side branches throughout the right lung, compatible with a large volume of pulmonary embolism. Although several of these defects appear been nonocclusive, there are occlusive defects in the right upper lobe, right middle lobe and posterior aspect of the right lower lobe.  No filling defects are noted within the left pulmonary artery, lobar, segmental or subsegmental sized branches to the left lung.  The pulmonic trunk appears mildly dilated (3.4 cm in diameter), which may suggest  elevated pulmonary artery pressures. Additionally, the right heart appears slightly dilated, with mild right-to-left bowing of the interatrial septum, which could suggest some elevated right-sided heart pressures. There is no significant pericardial fluid, thickening or pericardial calcification. There is atherosclerosis of the thoracic aorta, the great vessels of the mediastinum and the coronary arteries, including calcified atherosclerotic plaque in the left main, left anterior descending, left circumflex and right coronary arteries. Mild calcifications of the mitral annulus and the aortic valve. No pathologically enlarged mediastinal or hilar lymph nodes. Esophagus is unremarkable in appearance.     A stent is incidentally noted in the proximal left subclavian artery. Right internal jugular single lumen power Port-A- Cath with tip terminating in the superior aspect of the right atrium.  Lungs/Pleura: There is a large area of new air space consolidation and ground glass attenuation throughout much of the right lower lobe, which likely represents hemorrhage from pulmonary infarction. There continues to be some air space consolidation throughout the left lower lobe, although this is slightly improved compared to the prior study, which likely reflect resolving pneumonia.  Status post left upper lobectomy.  Calcified granuloma in the periphery of the right upper lobe is unchanged.  Moderate centrilobular emphysema. Mild diffuse bronchial wall thickening.  Postoperative scarring in the apex of the left hemithorax is unchanged.  Upper Abdomen: Unremarkable.  Musculoskeletal: Status post bilateral modified radical mastectomy with left axillary lymph node dissection. As with the prior examination and there is a lytic lesion with soft tissue component involving the posterior aspect of the left fifth rib, as well as the left T5 transverse process.  This appears similar to the prior study.  Multiple old healed bilateral rib  fractures are again noted.  IMPRESSION: 1.  Study is positive for a large volume of pulmonary embolism throughout the right lung, including both occlusive and nonocclusive clot.  There is evidence to suggest elevated pulmonary artery pressures and potentially elevated right-sided heart pressures.  In addition, there is evidence of hemorrhage throughout the right lower lobe, presumably related to pulmonary infarction. 2.  Slight improvement in airspace consolidation in the left lower lobe, suggesting positive response to therapy for presumed pneumonia. 3.  Large lytic lesion involving the posterior aspect of the left fifth rib and the left T5 transverse process appears similar to the prior study, and likely represents a metastatic lesion. 4. Atherosclerosis, including left main and three-vessel coronary artery disease. 5.  Additional findings, as above, similar to prior studies.  Critical Value/emergent results were called by telephone at the time of interpretation on Mar 19, 2013 at 10:58 a.m. to Dr. Juleen China, who verbally acknowledged these results.   Original Report Authenticated By: Trudie Reed, M.D.    MDM   1. Pulmonary embolism and infarction   2.  Hx of cancer of lung   3. Elevated troponin     CT as above. Unfortunately tx may worsen pulmonary hemorrhage. Signs of arterial hypertension/R heart strain based on CT. Significant underlying lung disease at baseline. Discussions with pt and family detailing all of this. Pt requesting DNR/DNI. Would like to be treated for correctable conditions otherwise. Will discuss with CCM in terms of recommendations for possible lytics. Mild elevation in troponin. This can be seen in the setting of PE.   11:38 AM Discussed with Dr Molli Knock. Would not give lytics with her blood pressure.   Raeford Razor, MD 02/19/2013 [phone]

## 2013-02-25 NOTE — Care Management Note (Addendum)
    Page 1 of 1   02/10/2013     1:30:11 PM   CARE MANAGEMENT NOTE 02/18/2013  Patient:  Katelyn [name], Katelyn [name]   Account Number:  [id number]  Date Initiated:  02/04/2013  Documentation initiated by:  Lanier Clam  Subjective/Objective Assessment:   67 Y/O F ADMITTED W/PE.READMIT-8/23-8/26-HEMOPTYSIS.GN:FAOZHY/QMVH CA.     Action/Plan:   FROM HOME.HAS HOME 02,HAS TRAVEL-[facility].HAS PCP,PHARMACY.   Anticipated DC Date:  03/05/2013   Anticipated DC Plan:  EXPIRED      DC Planning Services  CM consult      Choice offered to / List presented to:             Status of service:  Completed, signed off Medicare Important Message given?   (If response is "NO", the following Medicare IM given date fields will be blank) Date Medicare IM given:   Date Additional Medicare IM given:    Discharge Disposition:  EXPIRED  Per UR Regulation:  Reviewed for med. necessity/level of care/duration of stay  If discussed at Long Length of Stay Meetings, dates discussed:    Comments:  02/07/2013 Joslyn Ramos RN,BSN NCM WEEKEND [phone] EXPIRED ON 2013/03/05.  02/21/2013 Diamante Truszkowski RN,BSN NCM [phone] [facility]  KRISTEN MADE AWARE OF PATIENT USING [facility] HOME 02.

## 2013-02-25 NOTE — Progress Notes (Signed)
ANTICOAGULATION CONSULT NOTE - Initial Consult  Pharmacy Consult for Heparin Indication: pulmonary embolus  No Known Allergies  Patient Measurements:   Heparin Dosing Weight: 78.2 kg  Vital Signs: Temp: 97.9 F (36.6 C) (08/28 0938) Temp src: Oral (08/28 0938) BP: 131/56 mmHg (08/28 0938) Pulse Rate: 125 (08/28 0938)  Labs:  Recent Labs  02/02/2013 0948  HGB 12.0  HCT 34.9*  PLT 99*  CREATININE 0.88   The CrCl is unknown because both a height and weight (above a minimum accepted value) are required for this calculation.  Medical History: Past Medical History  Diagnosis Date  . 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 Pulmonary nodule, left 08/26/2011  . Hypertension   . Restless leg syndrome   . Peripheral vascular disease   . Carotid artery stenosis     bilateral  . Glomus tumor      right  . Shortness of breath     exertional  . Hypothyroidism   . Cancer     breast, bilateral s/p mast and chemo  . Shoulder pain, left     due to vascular dz  . Left hip pain     when ambulatory  . Episodic low back pain   . Depression     stress related  . Hard of hearing   . Hx of radiation therapy 06/04/95- 07/21/95    left chest wall, l axilla, l supraclavicular area  . Breast cancer 1996    left  . Breast cancer 1973    right, mastectomy  . Lung cancer 08/2011, 12/2012    left upper lobe, recurrence 2014   Medications:  Scheduled:  . albuterol  5 mg Nebulization Once  . ipratropium  0.5 mg Nebulization Once   Infusions:  . heparin    . heparin     Assessment: 6 yoF with increased shortness of breath on ambulation at home today. Chest EX:BMWUX R lobe PE, begin Heparin infusion.  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  Recent admit 8/23-8/26 for hemoptysis, PNA. CT comparison L lung: improvement of PNA.  Anticipate transition to Lovenox with active chemotherapy orders. Appropriate doses would be  80mg  q12 or 120mg  q24.  Goal of Therapy:  Heparin level 0.3-0.7 units/ml Monitor platelets by anticoagulation protocol: Yes   Plan:   Heparin bolus 2000 units, infusion at 1200 units/hr  Check Heparin level at 6 hr and then daily  PT/INT being drawn  Daily CBC while on Heparin  Jannely Henthorn L 02/19/2013,11:24 AM

## 2013-02-25 NOTE — ED Notes (Signed)
Per EMS pt states that she has gradually had more dyspnea on exertion but today when she was ambulating to restroom pt got very Shob.  Pt has lung cancer that has spread to her bones. Pt has porta cath.

## 2013-02-25 NOTE — Telephone Encounter (Signed)
Please phone in xanax rx with 1 refill

## 2013-02-26 ENCOUNTER — Ambulatory Visit: Payer: Medicare Other

## 2013-02-26 DIAGNOSIS — I959 Hypotension, unspecified: Secondary | ICD-10-CM

## 2013-02-26 DIAGNOSIS — C3490 Malignant neoplasm of unspecified part of unspecified bronchus or lung: Secondary | ICD-10-CM

## 2013-02-26 DIAGNOSIS — C3410 Malignant neoplasm of upper lobe, unspecified bronchus or lung: Secondary | ICD-10-CM

## 2013-02-26 LAB — CULTURE, BLOOD (ROUTINE X 2)

## 2013-02-26 LAB — BASIC METABOLIC PANEL
BUN: 27 mg/dL — ABNORMAL HIGH (ref 6–23)
Creatinine, Ser: 1.55 mg/dL — ABNORMAL HIGH (ref 0.50–1.10)
GFR calc Af Amer: 39 mL/min — ABNORMAL LOW (ref >90)
GFR calc non Af Amer: 33 mL/min — ABNORMAL LOW (ref >90)
Glucose, Bld: 126 mg/dL — ABNORMAL HIGH (ref 70–99)

## 2013-02-26 LAB — TROPONIN I: Troponin I: 0.81 ng/mL (ref <0.30)

## 2013-02-26 LAB — CBC
MCH: 31 pg (ref 26.0–34.0)
Platelets: 98 10*3/uL — ABNORMAL LOW (ref 150–400)
RBC: 3.39 MIL/uL — ABNORMAL LOW (ref 3.87–5.11)
WBC: 11.4 10*3/uL — ABNORMAL HIGH (ref 4.0–10.5)

## 2013-02-26 MED ORDER — MORPHINE SULFATE 10 MG/ML IJ SOLN
10.0000 mg | INTRAMUSCULAR | Status: DC | PRN
  Administered 2013-02-26 (×2): 10 mg via RESPIRATORY_TRACT
  Filled 2013-02-26 (×2): qty 1

## 2013-02-26 MED ORDER — MORPHINE (PF) INJECTION FOR INHALATION 10 MG/ML: 10.0000 mg | RESPIRATORY_TRACT | Status: DC | PRN

## 2013-02-26 MED ORDER — MORPHINE SULFATE 2 MG/ML IJ SOLN
2.0000 mg | INTRAMUSCULAR | Status: DC | PRN
  Administered 2013-02-26 (×6): 2 mg via INTRAVENOUS
  Administered 2013-02-26: 4 mg via INTRAVENOUS
  Filled 2013-02-26: qty 2
  Filled 2013-02-26 (×6): qty 1

## 2013-02-26 MED ORDER — MORPHINE SULFATE 10 MG/ML IJ SOLN
4.0000 mg/h | INTRAMUSCULAR | Status: DC
  Administered 2013-02-26: 4 mg/h via INTRAVENOUS
  Filled 2013-02-26: qty 10

## 2013-02-26 NOTE — Consult Note (Signed)
#   [id number] is consult note.  She looks much worse today.  Kidney function is declining.  More dyspnea.  Not much pain.  On Heparin drip.  Would continue this over the weekend (if she makes it!!).  I spoke with the family at length last night and her dgtr this AM.  I told her dgtr that she may not make it thru today.  She just looks different now. Her breathing is different.    Comfort care is the primary goal right now.  I will order MSO4 neb for her as this may help with her breathing.  I am very distressed over her decline!!!  Thanks for the great help and care that she is getting!!!  Pete E.

## 2013-02-26 NOTE — Progress Notes (Signed)
[facility] PROGRESS NOTE  Katelyn [name] ZOX:[id number] DOB: Mar 01, 1946 DOA: 02/21/2013 PCP: [name], MD  Assessment/Plan: 1. Pulmonary embolism  -with large clot burden, borderline hypotensive and tachypneic -continue IV heparin -given metastasis and pulm hemorrhage, no a good TPA candidate -Dr.Ennever following and discussed comfort care/measures,  -started morphine PRN for air hunger/pain -expect hospital demise -DC Tele -DNR -discussed all this with family  2. Metastatic non small Cell lung CA -recent chemo/XRT  3. Demand ischemia vs NSTEMI -see above  Code Status:DNR Family Communication: d/w both daughters and son at bedside Disposition Plan: expect hospital demise   Consultants:  Dr.Ennever   HPI/Subjective: More shortness or breath, unable to eat due to dyspnea and hypoxia  Objective: Filed Vitals:   02/26/13 0605  BP: 90/46  Pulse:   Temp: 98.2 F (36.8 C)  Resp: 24    Intake/Output Summary (Last 24 hours) at 02/26/13 1150 Last data filed at 02/26/13 0610  Gross per 24 hour  Intake      0 ml  Output    825 ml  Net   -825 ml   Filed Weights   02/16/2013 1255  Weight: 78.291 kg (172 lb 9.6 oz)    Exam:   General:  Lethargic, tachypneic  Cardiovascular: S1S2/RRR  Respiratory: Diminished breath sounds  Abdomen: soft, NT, BS present  Musculoskeletal: no edema c/c   Data Reviewed: Basic Metabolic Panel:  Recent Labs Lab 02/20/13 1200 02/22/13 0425 02/20/2013 0948 02/26/13 0450  NA 135  --  131* 131*  K 4.7  --  4.0 4.8  CL 103  --  97 100  CO2 25  --  24 24  GLUCOSE 120*  --  117* 126*  BUN 21  --  18 27*  CREATININE 0.84 0.76 0.88 1.55*  CALCIUM 8.6  --  8.7 8.1*   Liver Function Tests:  Recent Labs Lab 02/02/2013 0948  AST 15  ALT 12  ALKPHOS 59  BILITOT 0.6  PROT 6.2  ALBUMIN 2.5*   No results found for this basename: LIPASE, AMYLASE,  in the last 168 hours No results found for this basename: AMMONIA,   in the last 168 hours CBC:  Recent Labs Lab 02/20/13 1200 02/22/13 0425 02/02/2013 0948 02/26/13 0450  WBC 12.2* 7.0 8.4 11.4*  NEUTROABS 10.6*  --   --   --   HGB 11.3* 10.6* 12.0 10.5*  HCT 34.3* 32.9* 34.9* 32.1*  MCV 94.0 95.4 92.6 94.7  PLT 150 104* 99* 98*   Cardiac Enzymes:  Recent Labs Lab 02/08/2013 1804 02/03/2013 2210 02/26/13 0450  TROPONINI 0.47* 0.35* 0.81*   BNP (last 3 results) No results found for this basename: PROBNP,  in the last 8760 hours CBG: No results found for this basename: GLUCAP,  in the last 168 hours  Recent Results (from the past 240 hour(s))  CULTURE, BLOOD (ROUTINE X 2)     Status: None   Collection Time    02/20/13  5:13 PM      Result Value Range Status   Specimen Description BLOOD RIGHT ARM   Final   Special Requests BOTTLES DRAWN AEROBIC AND ANAEROBIC    Final   Culture  Setup Time     Final   Value: 02/20/2013 21:05     Performed at [facility]   Culture     Final   Value: NO GROWTH 5 DAYS     Performed at [facility]   Report Status 02/26/2013  FINAL   Final  CULTURE, BLOOD (ROUTINE X 2)     Status: None   Collection Time    02/20/13  5:19 PM      Result Value Range Status   Specimen Description BLOOD LEFT ARM   Final   Special Requests BOTTLES DRAWN AEROBIC AND ANAEROBIC    Final   Culture  Setup Time     Final   Value: 02/20/2013 21:05     Performed at [facility]   Culture     Final   Value: NO GROWTH 5 DAYS     Performed at [facility]   Report Status 02/26/2013 FINAL   Final  CULTURE, EXPECTORATED SPUTUM-ASSESSMENT     Status: None   Collection Time    02/21/13  7:05 PM      Result Value Range Status   Specimen Description SPUTUM   Final   Special Requests Immunocompromised   Final   Sputum evaluation     Final   Value: THIS SPECIMEN IS ACCEPTABLE. RESPIRATORY CULTURE REPORT TO FOLLOW.   Report Status 02/21/2013 FINAL   Final  CULTURE, RESPIRATORY (NON-EXPECTORATED)      Status: None   Collection Time    02/21/13  7:05 PM      Result Value Range Status   Specimen Description SPUTUM   Final   Special Requests NONE   Final   Gram Stain     Final   Value: ABUNDANT WBC PRESENT,BOTH PMN AND MONONUCLEAR     RARE SQUAMOUS EPITHELIAL CELLS PRESENT     NO ORGANISMS SEEN     Performed at [facility]   Culture     Final   Value: NORMAL OROPHARYNGEAL FLORA     Performed at [facility]   Report Status 02/24/2013 FINAL   Final     Studies: Dg Chest 2 View (if Patient Has Fever And/or Copd)  02/21/2013   *RADIOLOGY REPORT*  Clinical Data: Shortness of breath.  Lung cancer.  CHEST - 2 VIEW  Comparison: 02/21/2013.  Findings: The power port is stable.  Stable surgical changes involving the left lung with volume loss, scarring and probable radiation change.  No pleural effusion.  IMPRESSION: Stable postoperative and probable post radiation changes involving the left hemithorax.   Original Report Authenticated By: Rudie Meyer, M.D.   Ct Angio Chest W/cm &/or Wo Cm  01/29/2013   *RADIOLOGY REPORT*  Clinical Data: Evaluate for pulmonary embolism.  CT ANGIOGRAPHY CHEST  Technique:  Multidetector CT imaging of the chest using the standard protocol during bolus administration of intravenous contrast. Multiplanar reconstructed images including MIPs were obtained and reviewed to evaluate the vascular anatomy.  Contrast: OMNIPAQUE IOHEXOL 350 MG/ML SOLN  Comparison: Chest c t 02/20/2013.  Findings:  Mediastinum: There are large filling defects in the distal right pulmonary artery extending into lobar, segmental and subsegmental side branches throughout the right lung, compatible with a large volume of pulmonary embolism. Although several of these defects appear been nonocclusive, there are occlusive defects in the right upper lobe, right middle lobe and posterior aspect of the right lower lobe.  No filling defects are noted within the left pulmonary artery,  lobar, segmental or subsegmental sized branches to the left lung.  The pulmonic trunk appears mildly dilated (3.4 cm in diameter), which may suggest elevated pulmonary artery pressures. Additionally, the right heart appears slightly dilated, with mild right-to-left bowing of the interatrial septum, which could suggest some elevated right-sided  heart pressures. There is no significant pericardial fluid, thickening or pericardial calcification. There is atherosclerosis of the thoracic aorta, the great vessels of the mediastinum and the coronary arteries, including calcified atherosclerotic plaque in the left main, left anterior descending, left circumflex and right coronary arteries. Mild calcifications of the mitral annulus and the aortic valve. No pathologically enlarged mediastinal or hilar lymph nodes. Esophagus is unremarkable in appearance.     A stent is incidentally noted in the proximal left subclavian artery. Right internal jugular single lumen power Port-A- Cath with tip terminating in the superior aspect of the right atrium.  Lungs/Pleura: There is a large area of new air space consolidation and ground glass attenuation throughout much of the right lower lobe, which likely represents hemorrhage from pulmonary infarction. There continues to be some air space consolidation throughout the left lower lobe, although this is slightly improved compared to the prior study, which likely reflect resolving pneumonia.  Status post left upper lobectomy.  Calcified granuloma in the periphery of the right upper lobe is unchanged.  Moderate centrilobular emphysema. Mild diffuse bronchial wall thickening.  Postoperative scarring in the apex of the left hemithorax is unchanged.  Upper Abdomen: Unremarkable.  Musculoskeletal: Status post bilateral modified radical mastectomy with left axillary lymph node dissection. As with the prior examination and there is a lytic lesion with soft tissue component involving the posterior  aspect of the left fifth rib, as well as the left T5 transverse process.  This appears similar to the prior study.  Multiple old healed bilateral rib fractures are again noted.  IMPRESSION: 1.  Study is positive for a large volume of pulmonary embolism throughout the right lung, including both occlusive and nonocclusive clot.  There is evidence to suggest elevated pulmonary artery pressures and potentially elevated right-sided heart pressures.  In addition, there is evidence of hemorrhage throughout the right lower lobe, presumably related to pulmonary infarction. 2.  Slight improvement in airspace consolidation in the left lower lobe, suggesting positive response to therapy for presumed pneumonia. 3.  Large lytic lesion involving the posterior aspect of the left fifth rib and the left T5 transverse process appears similar to the prior study, and likely represents a metastatic lesion. 4. Atherosclerosis, including left main and three-vessel coronary artery disease. 5.  Additional findings, as above, similar to prior studies.  Critical Value/emergent results were called by telephone at the time of interpretation on 02/21/2013 at 10:58 a.m. to Dr. Juleen China, who verbally acknowledged these results.   Original Report Authenticated By: Trudie Reed, M.D.    Scheduled Meds: . citalopram  40 mg Oral q morning - 10a  . gabapentin  300 mg Oral TID  . levothyroxine  112 mcg Oral QAC breakfast  . mirtazapine  15 mg Oral QHS  . OxyCODONE  10 mg Oral Q12H  . pramipexole  0.125 mg Oral TID  . sodium chloride  3 mL Intravenous Q12H   Continuous Infusions: . sodium chloride 50 mL/hr at 02/19/2013 2034  . heparin 1,200 Units/hr (02/26/13 0522)    Principal Problem:   Pulmonary embolism and infarction Active Problems:   Breast cancer, s/p Mstectomy and XRT 1996   Hypertension   Hypothyroidism   Hx of cancer of lung   Chronic respiratory failure   Hospital acquired PNA    Time spent:     [facility]  [facility] Pager [phone]. If 7PM-7AM, please contact night-coverage at [email or web address], password [facility] 02/26/2013, 11:50 AM  LOS: 1 day

## 2013-02-26 NOTE — Progress Notes (Signed)
Nutrition Brief Note  Patient identified on the Malnutrition Screening Tool (MST) Report  Wt Readings from Last 15 Encounters:  01/30/2013 172 lb 9.6 oz (78.291 kg)  02/20/13 172 lb 6.4 oz (78.2 kg)  02/16/13 170 lb (77.111 kg)  02/11/13 165 lb (74.844 kg)  02/09/13 165 lb 4.8 oz (74.98 kg)  02/02/13 163 lb 11.2 oz (74.254 kg)  01/28/13 167 lb (75.751 kg)  01/25/13 168 lb 3.2 oz (76.295 kg)  01/18/13 165 lb (74.844 kg)  01/06/13 165 lb (74.844 kg)  01/05/13 164 lb (74.39 kg)  12/29/12 167 lb (75.751 kg)  09/28/12 165 lb (74.844 kg)  05/13/12 159 lb (72.122 kg)  02/18/12 158 lb (71.668 kg)    Body mass index is 27.87 kg/(m^2). Patient meets criteria for Overweight based on current BMI.   Per RN pt is end of life; not eating or drinking.  No nutrition interventions warranted at this time. RD sign-off.  Ian Malkin RD, LDN Inpatient Clinical Dietitian Pager: [phone] After Hours Pager: [phone]

## 2013-02-26 NOTE — Progress Notes (Signed)
Pt's breathing seem more labored this am even with Morphine. 50% venti-mask placed on pt and O2 sat went from the hight 80's to 96%. Pt states she is more comfortable with the mask at this time. Will cont to monitor. Daughter at bedside.

## 2013-02-26 NOTE — Progress Notes (Signed)
ANTICOAGULATION CONSULT NOTE - Follow Up  Pharmacy Consult for Heparin Indication: pulmonary embolus  No Known Allergies  Patient Measurements: Height: 5\' 6"  (167.6 cm) Weight: 172 lb 9.6 oz (78.291 kg) IBW/kg (Calculated) : 59.3 Heparin Dosing Weight: 78.2 kg  Vital Signs: Temp: 98.2 F (36.8 C) (08/29 0605) Temp src: Axillary (08/29 0605) BP: 90/46 mmHg (08/29 0605)  Labs:  Recent Labs  03-10-2013 0948 Mar 10, 2013 1132 03/10/13 1804 03-10-2013 2210 02/26/13 0450  HGB 12.0  --   --   --  10.5*  HCT 34.9*  --   --   --  32.1*  PLT 99*  --   --   --  98*  APTT  --  27  --   --   --   LABPROT  --  14.0  --   --   --   INR  --  1.10  --   --   --   HEPARINUNFRC  --   --  0.58  --  0.61  CREATININE 0.88  --   --   --  1.55*  TROPONINI  --   --  0.47* 0.35* 0.81*   Estimated Creatinine Clearance: 36.7 ml/min (by C-G formula based on Cr of 1.55).   Assessment: 67 yo F with increased shortness of breath on ambulation at home today. Chest CT shows large R lobe PE. Heparin infusion started 8/28.Marland Kitchen  Hx of squamous cell Lung Ca with current chemo: Cisplatin q 7 days, Cycle 5 due 9/4, Cycle 4 completed 8/21.  Recent admit 8/23-8/26 for hemoptysis, PNA. CT comparison L lung: improvement of PNA.  No bleeding or infusion line issues documented  No hemoptysis (lung hemorrhage) so ok to continue IV heparin per MD  Heparin level remains therapeutic  Goal of Therapy:  Heparin level 0.3-0.7 units/ml Monitor platelets by anticoagulation protocol: Yes   Plan:   Continue heparin infusion at 1200 units/hr  Daily CBC and HL  Monitor for s/sx bleeding  Darrol Angel, PharmD Pager: [phone] 02/26/2013 10:59 AM

## 2013-02-26 NOTE — Telephone Encounter (Signed)
Called in.

## 2013-02-26 NOTE — Progress Notes (Signed)
Pt seems more comfortable now, resting, breathing less labored.

## 2013-02-27 DIAGNOSIS — R627 Adult failure to thrive: Secondary | ICD-10-CM

## 2013-02-27 LAB — CBC
HCT: 30.7 % — ABNORMAL LOW (ref 36.0–46.0)
MCH: 30.6 pg (ref 26.0–34.0)
MCV: 96.8 fL (ref 78.0–100.0)
Platelets: 87 10*3/uL — ABNORMAL LOW (ref 150–400)
RBC: 3.17 MIL/uL — ABNORMAL LOW (ref 3.87–5.11)
RDW: 14.8 % (ref 11.5–15.5)

## 2013-02-27 MED ORDER — SCOPOLAMINE 1 MG/3DAYS TD PT72
1.0000 | MEDICATED_PATCH | TRANSDERMAL | Status: DC
  Administered 2013-02-27: 1.5 mg via TRANSDERMAL
  Filled 2013-02-27: qty 1

## 2013-03-01 NOTE — Consult Note (Signed)
Katelyn [name], Katelyn [name] NO.:  [id number]  MEDICAL RECORD NO.:  [id number]  LOCATION:  1408                         FACILITY:  [facility]  PHYSICIAN:  Josph Macho, M.D.  DATE OF BIRTH:  12-Jun-1946  DATE OF CONSULTATION:  02/26/2013 DATE OF DISCHARGE:                                CONSULTATION   REASON FOR CONSULTATION: 1. Right-sided pulmonary embolism. 2. 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  HISTORY PRESENT ILLNESS:  Katelyn [name] is very well known to me.  She is a very nice 67 year old white female.  I have known her for about 19 years.  She has a past history of inflammatory carcinoma of the left breast.  The patient then presented with squamous cell carcinoma of the left lung.  She underwent surgery last year for this.  She was not a candidate for adjuvant chemotherapy due to her poor performance status after surgery.  The patient has subsequently presented with left pleural effusion.  She had recurrence in the left chest wall.  She has been undergoing radiation and chemotherapy.  She has had low-dose chemotherapy. She was admitted last week.  She has some hemoptysis.  She had changes on the left lung which was felt to be consistent with pneumonia or possible radiation changes.  The hemoptysis resolved.  She had improvement in her pulmonary status.  She was placed on an antibiotics. I gave her some steroids.  She now is admitted for a large pulmonary embolism on the right side. There was both occlusive and nonocclusive clot.  She had elevated pulmonary artery pressures and right-sided heart strain.  She has been started on a heparin drip.  She was seen last night by myself.  She seemed pretty comfortable.  Over night, she has declined.  Her blood pressure is going down.  She currently is on 15 L of oxygen.  Again, there is no hemoptysis.  She denies any significant pain.  There may be some slight discomfort over on the right side.  She has  had no fever.  There has been no nausea, vomiting.  Her oral intake has been low.  PAST MEDICAL HISTORY:  Remarkable for: 1. 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 3. Hypertension. 4. Peripheral vascular disease. 5. Depression. 6. Restless legs syndrome.  ALLERGIES:  None.  ADMISSION MEDICATIONS:  Documented in the electronic record.  FAMILY HISTORY:  Remarkable for a brother with lung cancer.  There is also history of breast cancer in a sister.  REVIEW OF SYSTEMS:  As stated in the history of present illness.  PHYSICAL EXAMINATION:  GENERAL:  This is a somewhat ill-appearing white female, in no obvious distress. VITAL SIGNS:  Temperature of 98.2, pulse 115, respiratory rate 28, blood pressure 90/46, oxygen saturation 96%. HEAD AND NECK:  No ocular or oral lesions.  She has no adenopathy in the neck.  Oral mucosa is somewhat dry. LUNGS:  Relatively clear bilaterally.  She has no wheezes.  She has no rhonchi. CARDIAC:  Tachycardic, but regular.  She has no murmurs, rubs, or bruits. ABDOMEN:  Soft.  She has good bowel sounds.  There is no fluid wave. There is no palpable hepatosplenomegaly. EXTREMITIES:  No clubbing, cyanosis, or edema.  No obvious venous cord is noted in her legs. SKIN:  Slightly cool to touch. NEUROLOGICAL:  No focal neurological deficits.  LABORATORY STUDIES:  Sodium 131, potassium 4.8, BUN 27, creatinine 1.55. Calcium 8.1.  Her albumin from August 28, is 2.5.  Her troponin I is 0.81.  Her white cell count 11.4, hemoglobin 10.5, hematocrit 32.1, platelet count 98,000.  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  She currently has undergone a radiation and chemotherapy.  She now has a large pulmonary embolism on the right side.  This is affecting her heart.  She has some right  heart strain.  Her ejection fraction was 50-55%.  I am certainly concerned about Katelyn [name]'s prognosis.  I just think that she might be having a hard time getting through this.  She has been through quite a bit lately.  She had that pneumonia over on the left lung last week.  She has been undergoing treatment for her recurrent lung cancer.  She now has this pulmonary embolism.  She clearly was hypercoagulable.  I totally agree with the heparin.  She seems to be a little bit worse this morning then when I saw her last night.  Again, this could be a sign that she is having cardiac difficulties.  She may be suffering from right-sided heart dysfunction. I do not see evidence of right-sided heart failure.  Her kidneys are shown some stress.  This might be from right-sided heart stress and/or hypotension.  I have talked to the family at length.  I have known Katelyn [name] for 19 years.  I have known the family for a long, long time.  I totally agree with the DNR status.  Katelyn [name] would not want to be kept alive on machines.  She was incredibly independent and has been very strong-willed.  It is really hard to say whether or not she is going to make it through this episode.  Again, she is on a heparin drip.  She is not a candidate for any thrombolytic therapy from my point of view because of the hemoptysis.  I think we do need to focus on her comfort right now.  I think today it will be a critical day for her.  I told her daughter that it is possible that she may not make it through this today.  I very much appreciate the great help that she is receiving up on [facility] [address].  The staff is doing a great job with her.  We will certainly follow along closely.  I will do that I can to make sure that she has comfort.     Josph Macho, M.D.     PRE/MEDQ  D:  02/26/2013  T:  02/16/2013  Job:  [id number]

## 2013-03-01 NOTE — Progress Notes (Signed)
ANTICOAGULATION CONSULT NOTE - Follow Up  Pharmacy Consult for Heparin Indication: pulmonary embolus  No Known Allergies  Patient Measurements: Height: 5\' 6"  (167.6 cm) Weight: 172 lb 9.6 oz (78.291 kg) IBW/kg (Calculated) : 59.3 Heparin Dosing Weight: 78.2 kg  Vital Signs:    Labs:  Recent Labs  02/26/2013 0948 02/02/2013 1132 02/01/2013 1804 02/05/2013 2210 02/26/13 0450 25-Mar-2013 0515  HGB 12.0  --   --   --  10.5* 9.7*  HCT 34.9*  --   --   --  32.1* 30.7*  PLT 99*  --   --   --  98* 87*  APTT  --  27  --   --   --   --   LABPROT  --  14.0  --   --   --   --   INR  --  1.10  --   --   --   --   HEPARINUNFRC  --   --  0.58  --  0.61 0.47  CREATININE 0.88  --   --   --  1.55*  --   TROPONINI  --   --  0.47* 0.35* 0.81*  --    Estimated Creatinine Clearance: 36.7 ml/min (by C-G formula based on Cr of 1.55).   Assessment: 67 yo F with lung cancer presented with increased shortness of breath on ambulation at home. Chest CT shows large R lobe PE. Heparin infusion started 8/28.Marland Kitchen  Hx of squamous cell Lung Ca with current chemo: Cisplatin q 7 days, Cycle 5 due 9/4, Cycle 4 completed 8/21.  Recent admit 8/23-8/26 for hemoptysis, PNA. CT comparison L lung: improvement of PNA.  No bleeding or infusion line issues documented  No hemoptysis (lung hemorrhage) so ok to continue IV heparin per MD  Heparin level remains therapeutic  Goal of Therapy:  Heparin level 0.3-0.7 units/ml Monitor platelets by anticoagulation protocol: Yes   Plan:   Continue heparin infusion at 1200 units/hr  Daily CBC and HL  Monitor for s/sx bleeding  Loralee Pacas, PharmD, BCPS Pager: [phone]  2013/03/25 8:46 AM

## 2013-03-01 NOTE — Progress Notes (Signed)
Mrs. Petron made it through the night. She now has periods of apnea. He is less responsive. Her heart is irregular in rhythm. She does open her eyes a little bit. She's now and morphine drip. She is comfortable. There is no dyspnea. Her breaths are very shallow.  Her oxygen saturation is only 73%. She's on a facemask. Vital signs have been stopped.  She does not have much in way of secretions. I will put on a scopolamine patch anyway.  Her family is with her. I suspect that she will not last more than 2 or 3 hours. The oxygen that she has on is keeping her alive. Her family does not want to take this off her. I understand this.  She will be on the other side of glory today and she has won the battle.  I will continue to pray for the family. They are incredibly dedicated to her which is no surprise!!  I appreciate the great care is she's gotten by the hospitalist and the staff on [facility]!!  Pete E.

## 2013-03-01 NOTE — Progress Notes (Signed)
Pt  RR=0, P=0, no hearbeat on auscultation. Pt was pronounced dead at 1630  with another RN [facility].

## 2013-03-01 NOTE — Progress Notes (Addendum)
[facility] PROGRESS NOTE  Katelyn [name] ZOX:[id number] DOB: 11/04/1945 DOA: 02/19/2013 PCP: [name], MD  Assessment/Plan: 1. Acute resp failure -DNR, comfort care now -actively dying, on morphine gtt  2. Pulmonary embolism  -with large clot burden, hypotensive, hypoxic and tachypneic  2. Metastatic non small Cell lung CA -recent chemo/XRT  3. Demand ischemia vs NSTEMI -see above  COMFORT Care Actively dying Continue and titrate morphine gtt Extensive family at bedside Expect quick demise  Code Status:DNR Family Communication: d/w both daughters and [name] at bedside Disposition Plan: expect quick demise today   Consultants:  Dr.Ennever   HPI/Subjective: Unresponsive on morphine gtt now  Objective: Filed Vitals:   02/01/2013 0331  BP:   Pulse:   Temp:   Resp: 11    Intake/Output Summary (Last 24 hours) at 02/01/2013 1147 Last data filed at 02/21/2013 0142  Gross per 24 hour  Intake      0 ml  Output    400 ml  Net   -400 ml   Filed Weights   02/21/2013 1255  Weight: 78.291 kg (172 lb 9.6 oz)    Exam:   General:  Unresponsive, Lethargic, tachypneic  Cardiovascular: S1S2/RRR  Respiratory: Diminished breath sounds  Abdomen: soft, NT, BS present  Musculoskeletal: no edema c/c   Data Reviewed: Basic Metabolic Panel:  Recent Labs Lab 02/20/13 1200 02/22/13 0425 02/04/2013 0948 02/26/13 0450  NA 135  --  131* 131*  K 4.7  --  4.0 4.8  CL 103  --  97 100  CO2 25  --  24 24  GLUCOSE 120*  --  117* 126*  BUN 21  --  18 27*  CREATININE 0.84 0.76 0.88 1.55*  CALCIUM 8.6  --  8.7 8.1*   Liver Function Tests:  Recent Labs Lab 02/10/2013 0948  AST 15  ALT 12  ALKPHOS 59  BILITOT 0.6  PROT 6.2  ALBUMIN 2.5*   No results found for this basename: LIPASE, AMYLASE,  in the last 168 hours No results found for this basename: AMMONIA,  in the last 168 hours CBC:  Recent Labs Lab 02/20/13 1200 02/22/13 0425 02/07/2013 0948  02/26/13 0450 02/06/2013 0515  WBC 12.2* 7.0 8.4 11.4* 11.6*  NEUTROABS 10.6*  --   --   --   --   HGB 11.3* 10.6* 12.0 10.5* 9.7*  HCT 34.3* 32.9* 34.9* 32.1* 30.7*  MCV 94.0 95.4 92.6 94.7 96.8  PLT 150 104* 99* 98* 87*   Cardiac Enzymes:  Recent Labs Lab 01/30/2013 1804 02/19/2013 2210 02/26/13 0450  TROPONINI 0.47* 0.35* 0.81*   BNP (last 3 results) No results found for this basename: PROBNP,  in the last 8760 hours CBG: No results found for this basename: GLUCAP,  in the last 168 hours  Recent Results (from the past 240 hour(s))  CULTURE, BLOOD (ROUTINE X 2)     Status: None   Collection Time    02/20/13  5:13 PM      Result Value Range Status   Specimen Description BLOOD RIGHT ARM   Final   Special Requests BOTTLES DRAWN AEROBIC AND ANAEROBIC    Final   Culture  Setup Time     Final   Value: 02/20/2013 21:05     Performed at [facility]   Culture     Final   Value: NO GROWTH 5 DAYS     Performed at [facility]   Report Status 02/26/2013 FINAL   Final  CULTURE, BLOOD (ROUTINE X 2)     Status: None   Collection Time    02/20/13  5:19 PM      Result Value Range Status   Specimen Description BLOOD LEFT ARM   Final   Special Requests BOTTLES DRAWN AEROBIC AND ANAEROBIC    Final   Culture  Setup Time     Final   Value: 02/20/2013 21:05     Performed at [facility]   Culture     Final   Value: NO GROWTH 5 DAYS     Performed at [facility]   Report Status 02/26/2013 FINAL   Final  CULTURE, EXPECTORATED SPUTUM-ASSESSMENT     Status: None   Collection Time    02/21/13  7:05 PM      Result Value Range Status   Specimen Description SPUTUM   Final   Special Requests Immunocompromised   Final   Sputum evaluation     Final   Value: THIS SPECIMEN IS ACCEPTABLE. RESPIRATORY CULTURE REPORT TO FOLLOW.   Report Status 02/21/2013 FINAL   Final  CULTURE, RESPIRATORY (NON-EXPECTORATED)     Status: None   Collection Time    02/21/13   7:05 PM      Result Value Range Status   Specimen Description SPUTUM   Final   Special Requests NONE   Final   Gram Stain     Final   Value: ABUNDANT WBC PRESENT,BOTH PMN AND MONONUCLEAR     RARE SQUAMOUS EPITHELIAL CELLS PRESENT     NO ORGANISMS SEEN     Performed at [facility]   Culture     Final   Value: NORMAL OROPHARYNGEAL FLORA     Performed at [facility]   Report Status 02/24/2013 FINAL   Final     Studies: No results found.  Scheduled Meds: . citalopram  40 mg Oral q morning - 10a  . gabapentin  300 mg Oral TID  . levothyroxine  112 mcg Oral QAC breakfast  . mirtazapine  15 mg Oral QHS  . OxyCODONE  10 mg Oral Q12H  . pramipexole  0.125 mg Oral TID  . scopolamine  1 patch Transdermal Q72H  . sodium chloride  3 mL Intravenous Q12H   Continuous Infusions: . sodium chloride 50 mL/hr at 02/10/2013 2034  . heparin 1,200 Units/hr (03-15-2013 0235)  . morphine 4 mg/hr (02/26/13 2215)    Principal Problem:   Pulmonary embolism and infarction Active Problems:   Breast cancer, s/p Mstectomy and XRT 1996   Hypertension   Hypothyroidism   Hx of cancer of lung   Chronic respiratory failure   Hospital acquired PNA    Time spent:    [facility]  [facility] Pager [phone]. If 7PM-7AM, please contact night-coverage at [email or web address], password [facility] 15-Mar-2013, 11:47 AM  LOS: 2 days

## 2013-03-01 DEATH — deceased

## 2013-03-02 ENCOUNTER — Ambulatory Visit: Payer: Medicare Other

## 2013-03-02 ENCOUNTER — Other Ambulatory Visit: Payer: Medicare Other | Admitting: Lab

## 2013-03-03 ENCOUNTER — Telehealth: Payer: Self-pay | Admitting: Dietician

## 2013-03-03 ENCOUNTER — Ambulatory Visit: Payer: Medicare Other

## 2013-03-04 ENCOUNTER — Ambulatory Visit: Payer: Medicare Other | Admitting: Hematology & Oncology

## 2013-03-04 ENCOUNTER — Other Ambulatory Visit: Payer: Medicare Other | Admitting: Lab

## 2013-03-04 ENCOUNTER — Ambulatory Visit: Payer: Medicare Other

## 2013-03-05 ENCOUNTER — Ambulatory Visit: Payer: Medicare Other

## 2013-03-08 ENCOUNTER — Other Ambulatory Visit: Payer: Medicare Other | Admitting: Lab

## 2013-03-08 ENCOUNTER — Ambulatory Visit: Payer: Medicare Other

## 2013-03-09 ENCOUNTER — Ambulatory Visit: Payer: Medicare Other

## 2013-03-10 ENCOUNTER — Ambulatory Visit: Payer: Medicare Other

## 2013-03-11 ENCOUNTER — Ambulatory Visit: Payer: Medicare Other

## 2013-03-11 ENCOUNTER — Other Ambulatory Visit: Payer: Medicare Other | Admitting: Lab

## 2013-03-12 ENCOUNTER — Ambulatory Visit: Payer: Medicare Other

## 2013-03-15 ENCOUNTER — Ambulatory Visit: Payer: Medicare Other

## 2013-03-16 ENCOUNTER — Ambulatory Visit: Payer: Medicare Other

## 2013-03-25 ENCOUNTER — Other Ambulatory Visit: Payer: Medicare Other | Admitting: Lab

## 2013-03-25 ENCOUNTER — Ambulatory Visit (HOSPITAL_BASED_OUTPATIENT_CLINIC_OR_DEPARTMENT_OTHER): Payer: Medicare Other

## 2013-03-30 ENCOUNTER — Ambulatory Visit: Payer: Medicare Other | Admitting: Hematology & Oncology

## 2013-03-31 NOTE — Discharge Summary (Addendum)
Death Summary  [name] AOZ:[id number] DOB: 1945/10/22 DOA: 03-02-2013  PCP: [name], MD  Admit date: 03-02-2013 Date of Death: March 04, 2013  Final Diagnoses:    Acute Respiratory Failure   Pulmonary embolism and infarction   Breast cancer, s/p Mstectomy and XRT 1996   Hypertension   Hypothyroidism   Hx of cancer of lung   Chronic respiratory failure   Hospital acquired PNA    History of present illness:  Katelyn [name] is a 67 y.o. female with significant past medical history for lung cancer (actively receiving chemo/radiation therapy), hx of breast cancer, HTN, depression, hypothyroidism, chronic resp failure and recent admission secondary to hemoptysis and HCAP; came to the hospital with worsening SOB and pleuritic chest pain on her right side. Found to be tachycardic in the ED and a CT angio was performed demonstrating positive for a large volume of pulmonary embolism throughout the right lung, including both occlusive and nonocclusive clot. There is evidence to suggest elevated pulmonary artery pressures and potentially elevated right-sided heart pressures. In addition, there is evidence of hemorrhage throughout the right lower lobe, presumably related to pulmonary infarction. [facility] called to admit patient for further evaluation and treatment. Patient is DNR/DNI and would like to avoid invasive therapy.   Hospital Course:  1. Acute resp failure -DNR, comfort care  -actively dying 2. Pulmonary embolism  -with large clot burden, hypotensive, hypoxic and tachypneic  2. Metastatic non small Cell lung CA  -recent chemo/XRT  3. Demand ischemia vs NSTEMI  -see above  SHe was made comfort care since declining despite medical treatment and Dr.Ennever helped a great deal in her care. Was started on a morphine drip and subsequently expired 03/04/13     Time:  Signed:  Frady Taddeo  [facility] 03/24/2013, 12:22 PM

## 2013-04-02 ENCOUNTER — Encounter: Payer: Self-pay | Admitting: Radiation Oncology

## 2013-04-02 NOTE — Progress Notes (Signed)
  Radiation Oncology         ([phone] ________________________________  Name: NATASIA SANKO MRN: [id number]  Date: 04/02/2013  DOB: Aug 01, 1945  End of Treatment Note  Diagnosis:   Recurrent lung cancer     Indication for treatment:  Pain related to tumor invading the chest wall       Radiation treatment dates:   02/02/2013 through 02/19/2013  Site/dose:   Left posterior chest wall  Beams/energy:   The patient was treated with IMRT in light of her previous left chest wall radiation therapy, 6 MV photons using 2 rapid arcs  Narrative: The patient tolerated radiation treatment relatively well.  The patient completed an abbreviated course of radiation therapy as part of her management. She was originally scheduled to receive 50 gray however after 28 gray patient was admitted to the hospital with shortness of breath. She was found to have a pulmonary infarction/embolism involving the right lung, the opposite side of her treatment site.  The patient expired from this new problem.    -----------------------------------  Billie Lade, PhD, MD

## 2013-04-09 ENCOUNTER — Telehealth: Payer: Self-pay | Admitting: Hematology & Oncology

## 2013-04-09 NOTE — Telephone Encounter (Signed)
Family member dropped off form that needs to be sent in, with postage paid envelope. I gave form to RN Amy and made copy and gave back to the family

## 2013-05-06 ENCOUNTER — Telehealth: Payer: Self-pay | Admitting: Hematology & Oncology

## 2013-05-06 NOTE — Telephone Encounter (Signed)
Mailed [employer] Life Claims forms to deceased pt's sister's home today to:  Renda Rolls C/O [place] [address] [place] [address]  Pt is deceased.   COPY SCANNED

## 2014-02-22 ENCOUNTER — Other Ambulatory Visit: Payer: Self-pay | Admitting: *Deleted

## 2015-03-07 IMAGING — CT CT BIOPSY
4 of 6 series · 8 of 24 positions shown, 11 images · non-contrast
Comparison: none

CLINICAL DATA: History of lung and breast carcinoma with new
posterior left chest wall and pleural mass.

[Series 7: add scan 5.0 b40f · axial · 0.50mm/px · z∈[-129,-124]mm · 2 of 4 slices shown, 5 images (1 of 4)]
[im 2/4  soft-tissue]
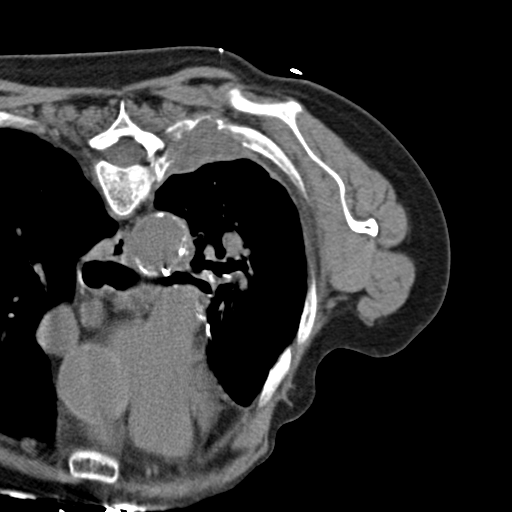
[im 2/4  lung]
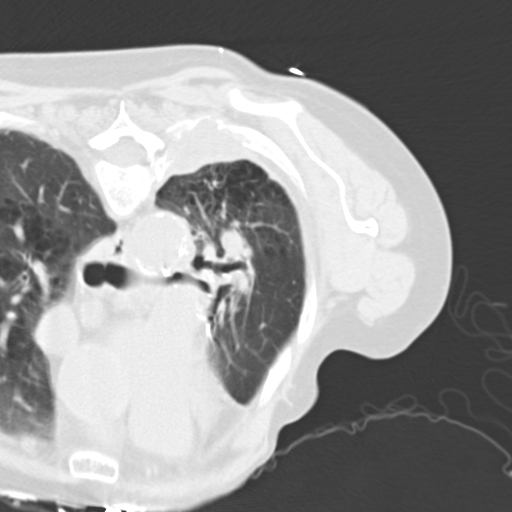
[im 2/4  bone]
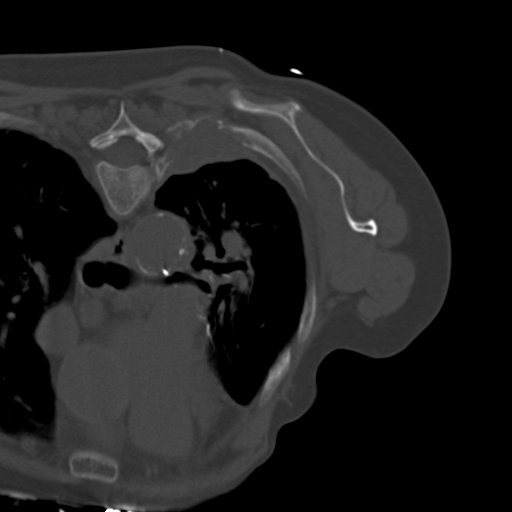
[im 3/4  soft-tissue]
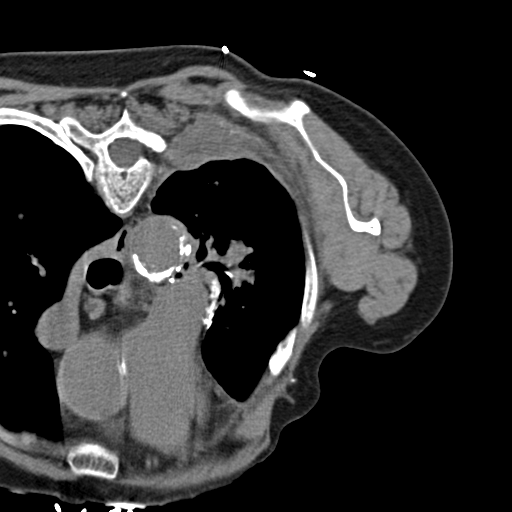
[im 3/4  lung]
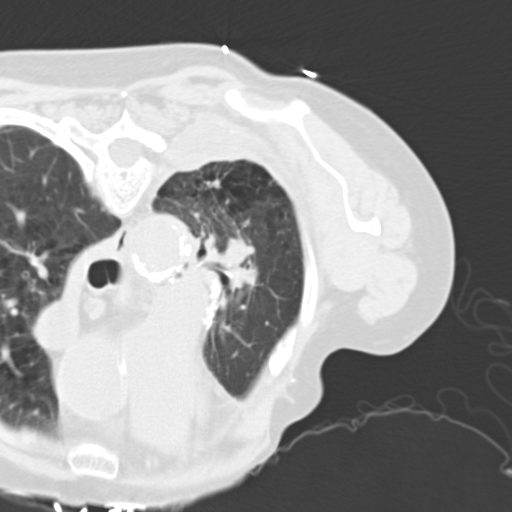

[Series 8: add scan 5.0 b40f · axial · 0.50mm/px · z∈[-129,-124]mm · 2 of 4 slices shown (2 of 4)]
[im 2/4  soft-tissue]
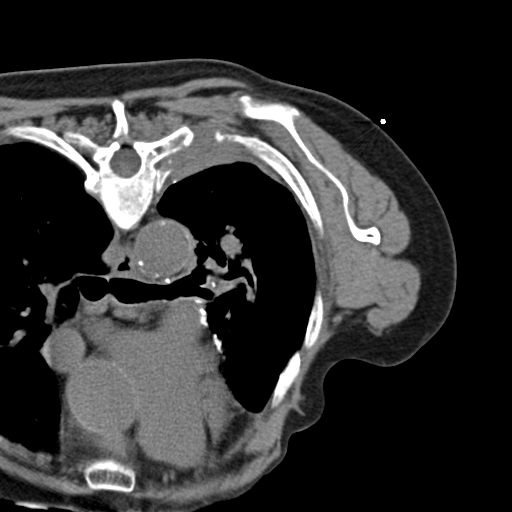
[im 3/4  soft-tissue]
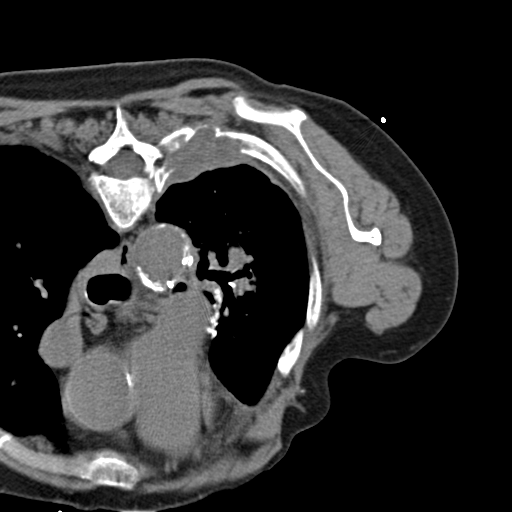

[Series 9: add scan 5.0 b40f · axial · 0.50mm/px · z∈[-122,-116]mm · 2 of 4 slices shown (3 of 4)]
[im 2/4  soft-tissue]
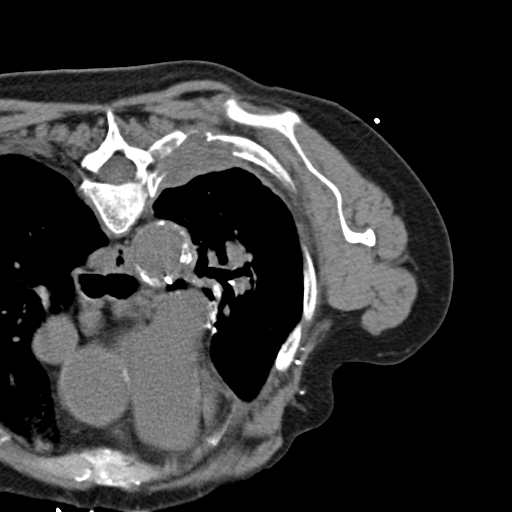
[im 3/4  soft-tissue]
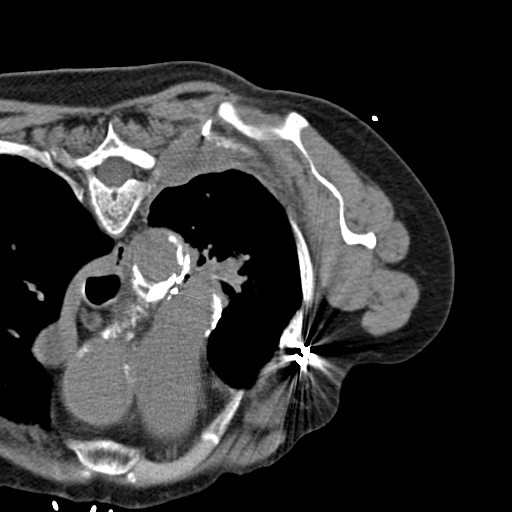

[Series 10: add scan 5.0 b40f · axial · 0.50mm/px · z∈[-122,-116]mm · 2 of 4 slices shown (4 of 4)]
[im 2/4  soft-tissue]
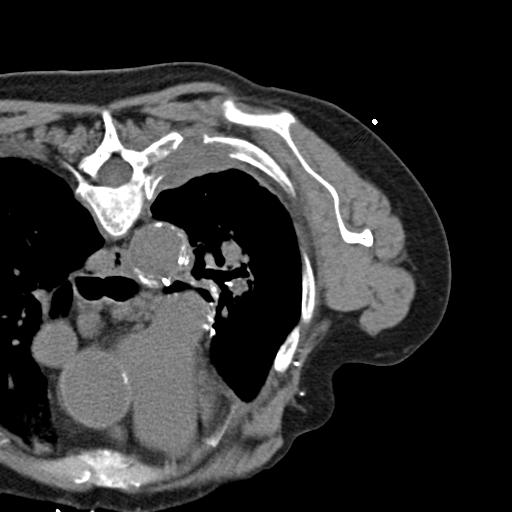
[im 3/4  soft-tissue]
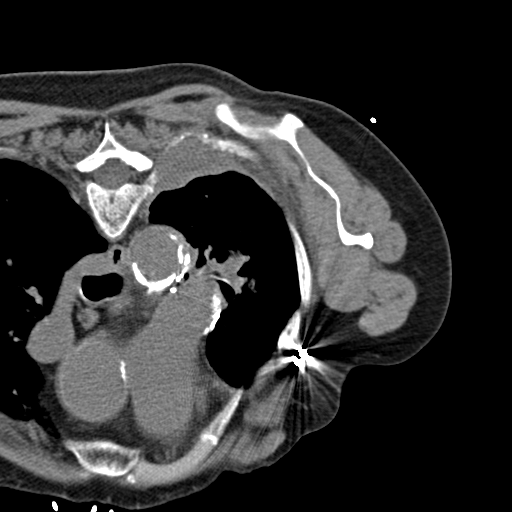

[8 of 24 positions shown; findings below may reference images not displayed]

CT GUIDED CORE BIOPSY OF PLEURAL MASS

Sedation:   1.5 mg IV Versed;  100 mcg IV Fentanyl

Total Moderate Sedation Time: 12 minutes.

Procedure:  The procedure risks, benefits, and alternatives were
explained to the patient.  Questions regarding the procedure were
encouraged and answered.  The patient understands and consents to
the procedure.

The left posterior chest wall was prepped with Betadine in a
sterile fashion, and a sterile drape was applied covering the
operative field.  A sterile gown and sterile gloves were used for
the procedure.  Local anesthesia was provided with 1% Lidocaine.

CT was performed in a prone position to localize the posterior left
chest wall mass.  Under CT guidance, a 17 gauge needle was advanced
from a posterior approach to the level of the mass.  Coaxial 18-
gauge core biopsy samples were obtained.  Three separate core
samples were submitted in formalin.  Postbiopsy imaging was
performed by CT.

Complications: None.  No pneumothorax.
FINDINGS: Posterior left chest wall mass was isolated which is
causing visible destruction of a segment of the left fifth rib.
Solid tissue was obtained from the lesion.  There were no immediate
complications.
IMPRESSION: CT guided core biopsy performed of the left posterior pleural/chest
wall mass.
